# Patient Record
Sex: Male | Born: 1945 | Race: Black or African American | Hispanic: No | Marital: Married | Smoking: Former smoker
Health system: Southern US, Community
[De-identification: ages and names within clinical notes are randomized; demographics above are authoritative.]

## PROBLEM LIST (undated history)

## (undated) DIAGNOSIS — M1990 Unspecified osteoarthritis, unspecified site: Secondary | ICD-10-CM

## (undated) DIAGNOSIS — I639 Cerebral infarction, unspecified: Secondary | ICD-10-CM

## (undated) DIAGNOSIS — E785 Hyperlipidemia, unspecified: Secondary | ICD-10-CM

## (undated) DIAGNOSIS — K219 Gastro-esophageal reflux disease without esophagitis: Secondary | ICD-10-CM

## (undated) DIAGNOSIS — I10 Essential (primary) hypertension: Secondary | ICD-10-CM

## (undated) HISTORY — DX: Hyperlipidemia, unspecified: E78.5

## (undated) HISTORY — PX: JOINT REPLACEMENT: SHX530

## (undated) HISTORY — DX: Cerebral infarction, unspecified: I63.9

## (undated) HISTORY — PX: WRIST SURGERY: SHX841

## (undated) HISTORY — PX: TONSILLECTOMY: SUR1361

## (undated) HISTORY — DX: Unspecified osteoarthritis, unspecified site: M19.90

## (undated) HISTORY — DX: Essential (primary) hypertension: I10

## (undated) HISTORY — PX: TONSILLECTOMY: SHX5217

## (undated) HISTORY — DX: Gastro-esophageal reflux disease without esophagitis: K21.9

---

## 2015-06-30 HISTORY — PX: KNEE SURGERY: SHX244

## 2016-09-17 ENCOUNTER — Encounter: Payer: Self-pay | Admitting: Family Medicine

## 2016-09-17 ENCOUNTER — Ambulatory Visit (INDEPENDENT_AMBULATORY_CARE_PROVIDER_SITE_OTHER): Payer: Medicare PPO | Admitting: Family Medicine

## 2016-09-17 DIAGNOSIS — M179 Osteoarthritis of knee, unspecified: Secondary | ICD-10-CM | POA: Insufficient documentation

## 2016-09-17 DIAGNOSIS — M1990 Unspecified osteoarthritis, unspecified site: Secondary | ICD-10-CM | POA: Insufficient documentation

## 2016-09-17 DIAGNOSIS — Z96651 Presence of right artificial knee joint: Secondary | ICD-10-CM

## 2016-09-17 DIAGNOSIS — K635 Polyp of colon: Secondary | ICD-10-CM | POA: Insufficient documentation

## 2016-09-17 DIAGNOSIS — D126 Benign neoplasm of colon, unspecified: Secondary | ICD-10-CM

## 2016-09-17 DIAGNOSIS — I10 Essential (primary) hypertension: Secondary | ICD-10-CM

## 2016-09-17 DIAGNOSIS — M1612 Unilateral primary osteoarthritis, left hip: Secondary | ICD-10-CM | POA: Diagnosis not present

## 2016-09-17 DIAGNOSIS — M1710 Unilateral primary osteoarthritis, unspecified knee: Secondary | ICD-10-CM | POA: Insufficient documentation

## 2016-09-17 DIAGNOSIS — K219 Gastro-esophageal reflux disease without esophagitis: Secondary | ICD-10-CM | POA: Diagnosis not present

## 2016-09-17 DIAGNOSIS — E785 Hyperlipidemia, unspecified: Secondary | ICD-10-CM

## 2016-09-17 DIAGNOSIS — I639 Cerebral infarction, unspecified: Secondary | ICD-10-CM | POA: Insufficient documentation

## 2016-09-17 NOTE — Progress Notes (Signed)
Chief Complaint  Patient presents with  . Establish Care   Previous PCP in [place] Also goes to the [facility] twice a year No complaints to day Compliant with medicines for BP and Lipids, baby asa to prevent stroke protonix for GERD Ibuprofen for OA in hip Vitamins Up to date with shots Up to date with screening Needs a colo this summer for history polyps Is awaiting a total hip arthroplasty in July   Patient Active Problem List   Diagnosis Date Noted  . HLD (hyperlipidemia) 09/17/2016  . Essential hypertension 09/17/2016  . Stroke ([place]) 09/17/2016  . Chronic GERD 09/17/2016  . Osteoarthritis 09/17/2016  . Knee joint replacement status, right 09/17/2016  . Colon polyp 09/17/2016    Outpatient Encounter Prescriptions as of 09/17/2016  Medication Sig  . amLODipine (NORVASC) 10 MG tablet Take 10 mg by mouth daily.  Marland Kitchen aspirin EC 81 MG tablet Take 81 mg by mouth daily.  . cholecalciferol (VITAMIN D) 1000 units tablet Take 2,000 Units by mouth daily.  . Cyanocobalamin (VITAMIN B 12 PO) Take by mouth.  . hydrochlorothiazide (HYDRODIURIL) 25 MG tablet Take 25 mg by mouth daily.  Marland Kitchen ibuprofen (ADVIL,MOTRIN) 800 MG tablet Take 800 mg by mouth daily.  . pantoprazole (PROTONIX) 40 MG tablet Take 40 mg by mouth daily.  . simvastatin (ZOCOR) 20 MG tablet Take 20 mg by mouth daily.   No facility-administered encounter medications on file as of 09/17/2016.     Past Medical History:  Diagnosis Date  . Arthritis    osteoarthritis knees hips  . GERD (gastroesophageal reflux disease)   . Hyperlipidemia   . Hypertension   . Stroke [facility])     Past Surgical History:  Procedure Laterality Date  . JOINT REPLACEMENT     right knee  . KNEE SURGERY Right 2017  . TONSILLECTOMY     age 64    Social History   Social History  . Marital status: Married    Spouse name: Nevin Bloodgood  . Number of children: 2  . Years of education: 16   Occupational History  . retired     [employer]  .     [employer]   Social History Main Topics  . Smoking status: Current Every Day Smoker    Packs/day: 0.30    Types: Cigarettes    Start date: 06/29/2008  . Smokeless tobacco: Never Used     Comment: 3 cig a day  . Alcohol use Yes     Comment: social drink occas  . Drug use: No  . Sexual activity: Not Currently    Birth control/ protection: Post-menopausal   Other Topics Concern  . Not on file   Social History Narrative   Lives with wife paula   She is in a wheelchair   Both children are adults and live in [place]   Retired in [place] from [place]    Family History  Problem Relation Age of Onset  . Arthritis Mother   . Cancer Mother     breast  . Hypertension Mother   . Alzheimer's disease Mother   . Cirrhosis Maternal Grandmother     Review of Systems  Constitutional: Negative for chills, fever and weight loss.  HENT: Negative.  Negative for congestion and hearing loss.        Regular dental care  Eyes: Negative for blurred vision and pain.       Regular eye care  Respiratory: Negative for cough and shortness of breath.  Cardiovascular: Negative for chest pain and leg swelling.  Gastrointestinal: Negative for abdominal pain, constipation, diarrhea and heartburn.  Genitourinary: Negative for dysuria and frequency.  Musculoskeletal: Positive for joint pain. Negative for falls and myalgias.       Hip  Neurological: Negative for dizziness, seizures and headaches.  Psychiatric/Behavioral: Negative for depression. The patient is not nervous/anxious and does not have insomnia.     BP 104/66 (BP Location: Right Arm, Patient Position: Sitting, Cuff Size: Normal)   Pulse 84   Temp 97.3 F (36.3 C) (Temporal)   Resp 18   Ht 5' 8.5" (1.74 m)   Wt 230 lb (104.3 kg)   SpO2 98%   BMI 34.46 kg/m   Physical Exam  Constitutional: He is oriented to person, place, and time. He appears well-developed and well-nourished.  HENT:  Head: Normocephalic and atraumatic.  Right Ear: External ear  normal.  Left Ear: External ear normal.  Mouth/Throat: Oropharynx is clear and moist.  Eyes: Conjunctivae are normal. Pupils are equal, round, and reactive to light.  Neck: Normal range of motion. Neck supple. No thyromegaly present.  Cardiovascular: Normal rate, regular rhythm and normal heart sounds.   Pulmonary/Chest: Effort normal and breath sounds normal. No respiratory distress.  Abdominal: Soft. Bowel sounds are normal.  Musculoskeletal: Normal range of motion. He exhibits no edema.  Lymphadenopathy:    He has no cervical adenopathy.  Neurological: He is alert and oriented to person, place, and time.  Gait normal  Skin: Skin is warm and dry.  Psychiatric: He has a normal mood and affect. His behavior is normal. Thought content normal.  Nursing note and vitals reviewed.   ASSESSMENT/PLAN:  1. Hyperlipidemia, unspecified hyperlipidemia type  - CBC - Comprehensive metabolic panel - Lipid panel - VITAMIN D 25 Hydroxy (Vit-D Deficiency, Fractures) - Urinalysis, Routine w reflex microscopic  2. Essential hypertension   3. Cerebrovascular accident (CVA), unspecified mechanism ([place])   4. Chronic GERD   5. Primary osteoarthritis of left hip   6. Knee joint replacement status, right   7. Adenomatous polyp of colon, unspecified part of colon    Patient Instructions  Need old records from PCP Need records from [place] if available Stay as active as you can manage See me in June for a PE and EKG pre op   Raylene Everts, MD

## 2016-09-17 NOTE — Patient Instructions (Signed)
Need old records from PCP Need records from [place] if available Stay as active as you can manage See me in June for a PE and EKG pre op

## 2016-09-18 ENCOUNTER — Telehealth: Payer: Self-pay | Admitting: Family Medicine

## 2016-09-18 NOTE — Telephone Encounter (Signed)
Patient's  calling doctor know that pt uses [facility]   1800 [phone]  Fax [phone]

## 2016-09-30 NOTE — Telephone Encounter (Signed)
Patient's calling to let doctor know that they use  [facility]   1800 [phone]  Fax [phone]

## 2016-09-30 NOTE — Telephone Encounter (Signed)
Changed in the computer

## 2016-11-17 ENCOUNTER — Ambulatory Visit: Payer: Self-pay | Admitting: Orthopedic Surgery

## 2016-11-27 ENCOUNTER — Encounter: Payer: Self-pay | Admitting: Family Medicine

## 2016-11-27 ENCOUNTER — Other Ambulatory Visit: Payer: Self-pay

## 2016-11-27 ENCOUNTER — Ambulatory Visit (INDEPENDENT_AMBULATORY_CARE_PROVIDER_SITE_OTHER): Payer: Medicare PPO | Admitting: Family Medicine

## 2016-11-27 VITALS — BP 112/68 | HR 68 | Temp 97.6°F | Resp 16 | Ht 69.0 in | Wt 224.1 lb

## 2016-11-27 DIAGNOSIS — Z01818 Encounter for other preprocedural examination: Secondary | ICD-10-CM

## 2016-11-27 DIAGNOSIS — M1612 Unilateral primary osteoarthritis, left hip: Secondary | ICD-10-CM | POA: Diagnosis not present

## 2016-11-27 NOTE — Patient Instructions (Signed)
I will report your pre operative results to Dr Les Pou

## 2016-11-27 NOTE — Progress Notes (Addendum)
Chief Complaint  Patient presents with  . Advice Only    pre surgical. left hip   L hip replacement anterior approach scheduled for June 13. Has had R knee replaced within the last 2 years with no complications and good success Post op pain management discussed His son will come help care for him No history anesthesia complications No cardiac diagnosis.  Can exercise without dyspnea or chest discomfort.  Has had cardiac testing X 2 in the past without CAD. 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 EKG done Labs ordered BP well controlled   Patient Active Problem List   Diagnosis Date Noted  . HLD (hyperlipidemia) 09/17/2016  . Essential hypertension 09/17/2016  . Stroke ([place]) 09/17/2016  . Chronic GERD 09/17/2016  . Osteoarthritis 09/17/2016  . Knee joint replacement status, right 09/17/2016  . Colon polyp 09/17/2016    Outpatient Encounter Prescriptions as of 11/27/2016  Medication Sig  . amLODipine (NORVASC) 10 MG tablet Take 10 mg by mouth daily.  Marland Kitchen aspirin EC 81 MG tablet Take 81 mg by mouth daily.  . cholecalciferol (VITAMIN D) 1000 units tablet Take 2,000 Units by mouth daily.  . Cyanocobalamin (VITAMIN B 12 PO) Take by mouth.  . hydrochlorothiazide (HYDRODIURIL) 25 MG tablet Take 25 mg by mouth daily.  Marland Kitchen ibuprofen (ADVIL,MOTRIN) 800 MG tablet Take 800 mg by mouth daily.  . pantoprazole (PROTONIX) 40 MG tablet Take 40 mg by mouth daily.  . simvastatin (ZOCOR) 20 MG tablet Take 20 mg by mouth daily.   No facility-administered encounter medications on file as of 11/27/2016.     Past Medical History:  Diagnosis Date  . Arthritis    osteoarthritis knees hips  . GERD (gastroesophageal reflux disease)   . Hyperlipidemia   . Hypertension   . Stroke [facility])     Past Surgical History:  Procedure Laterality Date  . JOINT REPLACEMENT     right knee  . KNEE SURGERY Right 2017  . TONSILLECTOMY     age 83    Social History   Social History  . Marital status: Married    Spouse  name: Nevin Bloodgood  . Number of children: 2  . Years of education: 16   Occupational History  . retired     [employer]  .      [employer]   Social History Main Topics  . Smoking status: Current Every Day Smoker    Packs/day: 0.30    Types: Cigarettes    Start date: 06/29/2008  . Smokeless tobacco: Never Used     Comment: 3 cig a day  . Alcohol use Yes     Comment: social drink occas  . Drug use: No  . Sexual activity: Not Currently    Birth control/ protection: Post-menopausal   Other Topics Concern  . Not on file   Social History Narrative   Lives with wife paula   She is in a wheelchair   Both children are adults and live in [place]   Retired in [place] from [place]    Family History  Problem Relation Age of Onset  . Arthritis Mother   . Cancer Mother        breast  . Hypertension Mother   . Alzheimer's disease Mother   . Cirrhosis Maternal Grandmother     Review of Systems  Constitutional: Negative for chills, fever and weight loss.  HENT: Negative for congestion and hearing loss.   Eyes: Negative for blurred vision and pain.  Respiratory: Negative  for cough and shortness of breath.   Cardiovascular: Negative for chest pain and leg swelling.  Gastrointestinal: Negative for abdominal pain, constipation, diarrhea and heartburn.  Genitourinary: Negative for dysuria and frequency.  Musculoskeletal: Positive for joint pain. Negative for falls and myalgias.       L hip only  Neurological: Negative for dizziness, seizures and headaches.  Psychiatric/Behavioral: Negative for depression. The patient is not nervous/anxious and does not have insomnia.     BP 112/68 (BP Location: Right Arm, Patient Position: Sitting, Cuff Size: Large)   Pulse 68   Temp 97.6 F (36.4 C) (Temporal)   Resp 16   Ht 5\' 9"  (1.753 m)   Wt 224 lb 1.9 oz (101.7 kg)   SpO2 99%   BMI 33.10 kg/m   Physical Exam   BP 112/68 (BP Location: Right Arm, Patient Position: Sitting, Cuff Size: Large)   Pulse 68    Temp 97.6 F (36.4 C) (Temporal)   Resp 16   Ht 5\' 9"  (1.753 m)   Wt 224 lb 1.9 oz (101.7 kg)   SpO2 99%   BMI 33.10 kg/m   General Appearance:    Alert, cooperative, no distress, appears stated age  Head:    Normocephalic, without obvious abnormality, atraumatic  Eyes:    PERRL, conjunctiva/corneas clear, EOM's intact, fundi    benign, both eyes       Ears:    Normal TM's and external ear canals, both ears  Nose:   Nares normal, septum midline, mucosa normal, no drainage   or sinus tenderness  Throat:   Lips, mucosa, and tongue normal; Upper denture, lower teeth , those remaining, intact and gums normal  Neck:   Supple, symmetrical, trachea midline, no adenopathy;       thyroid:  No enlargement/tenderness/nodules; no carotid   bruit   Back:     Symmetric, no curvature, ROM normal, no CVA tenderness  Lungs:     Clear to auscultation bilaterally, respirations unlabored  Chest wall:    No tenderness or deformity  Heart:    Regular rate and rhythm, S1 and S2 normal, no murmur, rub   or gallop, rare ectopy  Abdomen:     Soft, non-tender, bowel sounds active all four quadrants,    no masses, no organomegaly  Genitalia:    Normal male without lesion, discharge or tenderness  Extremities:   Extremities normal, atraumatic, no cyanosis. Well healed arthroplasty scar  R knee, mild edema R ankle  Pulses:   2+ and symmetric all extremities  Skin:   Skin color, texture, turgor normal, no rashes or lesions  Lymph nodes:   Cervical, supraclavicular, and axillary nodes normal  Neurologic:   Normal strength, sensation and reflexes      throughout    1. Primary osteoarthritis of left hip Limited ROM.  Pain.  End stage OA  2. Pre-op evaluation  - BASIC METABOLIC PANEL WITH GFR - CBC - Urinalysis, Routine w reflex microscopic   Patient Instructions  I will report your pre operative results to Dr Carmelia Bake, LABS: Results for [name], [name] (MRN [id number]) as of 11/30/2016 08:03   Ref. Range 11/27/2016 15:26  Sodium Latest Ref Range: 135 - 146 mmol/L 144  Potassium Latest Ref Range: 3.5 - 5.3 mmol/L 3.6  Chloride Latest Ref Range: 98 - 110 mmol/L 104  CO2 Latest Ref Range: 20 - 31 mmol/L 30  Glucose Latest Ref Range: 65 - 99 mg/dL 87  BUN Latest Ref Range: 7 -  25 mg/dL 11  Creatinine Latest Ref Range: 0.70 - 1.18 mg/dL 1.05  Calcium Latest Ref Range: 8.6 - 10.3 mg/dL 9.7  GFR, Est African American Latest Ref Range: >=60 mL/min 83  GFR, Est Non African American Latest Ref Range: >=60 mL/min 72  WBC Latest Ref Range: 3.8 - 10.8 K/uL 6.5  RBC Latest Ref Range: 4.20 - 5.80 MIL/uL 4.89  Hemoglobin Latest Ref Range: 13.2 - 17.1 g/dL 15.5  HCT Latest Ref Range: 38.5 - 50.0 % 44.5  MCV Latest Ref Range: 80.0 - 100.0 fL 91.0  MCH Latest Ref Range: 27.0 - 33.0 pg 31.7  MCHC Latest Ref Range: 32.0 - 36.0 g/dL 34.8  RDW Latest Ref Range: 11.0 - 15.0 % 14.5  Platelets Latest Ref Range: 140 - 400 K/uL 190  MPV Latest Ref Range: 7.5 - 12.5 fL 11.1  URINALYSIS, ROUTINE W REFLEX MICROSCOPIC Unknown Rpt  Appearance Latest Ref Range: CLEAR  CLEAR  Bilirubin Urine Latest Ref Range: NEGATIVE  NEGATIVE  Color, Urine Latest Ref Range: YELLOW  DARK YELLOW  Glucose Latest Ref Range: NEGATIVE  NEGATIVE  Hgb urine dipstick Latest Ref Range: NEGATIVE  NEGATIVE  Ketones, ur Latest Ref Range: NEGATIVE  NEGATIVE  Leukocytes, UA Latest Ref Range: NEGATIVE  NEGATIVE  Nitrite Latest Ref Range: NEGATIVE  NEGATIVE  pH Latest Ref Range: 5.0 - 8.0  6.0  Protein Latest Ref Range: NEGATIVE  NEGATIVE  Specific Gravity, Urine Latest Ref Range: 1.001 - 1.035  1.027  No medical problems identified that would place patient at increased risk from proposed surgery. Raylene Everts, MD

## 2016-11-28 LAB — CBC
HEMATOCRIT: 44.5 % (ref 38.5–50.0)
Hemoglobin: 15.5 g/dL (ref 13.2–17.1)
MCH: 31.7 pg (ref 27.0–33.0)
MCHC: 34.8 g/dL (ref 32.0–36.0)
MCV: 91 fL (ref 80.0–100.0)
MPV: 11.1 fL (ref 7.5–12.5)
PLATELETS: 190 10*3/uL (ref 140–400)
RBC: 4.89 MIL/uL (ref 4.20–5.80)
RDW: 14.5 % (ref 11.0–15.0)
WBC: 6.5 10*3/uL (ref 3.8–10.8)

## 2016-11-28 LAB — BASIC METABOLIC PANEL WITH GFR
BUN: 11 mg/dL (ref 7–25)
CALCIUM: 9.7 mg/dL (ref 8.6–10.3)
CO2: 30 mmol/L (ref 20–31)
CREATININE: 1.05 mg/dL (ref 0.70–1.18)
Chloride: 104 mmol/L (ref 98–110)
GFR, EST NON AFRICAN AMERICAN: 72 mL/min (ref >=60)
GFR, Est African American: 83 mL/min (ref >=60)
Glucose, Bld: 87 mg/dL (ref 65–99)
Potassium: 3.6 mmol/L (ref 3.5–5.3)
SODIUM: 144 mmol/L (ref 135–146)

## 2016-11-28 LAB — URINALYSIS, ROUTINE W REFLEX MICROSCOPIC
BILIRUBIN URINE: NEGATIVE
Glucose, UA: NEGATIVE
Hgb urine dipstick: NEGATIVE
Ketones, ur: NEGATIVE
Leukocytes, UA: NEGATIVE
Nitrite: NEGATIVE
PH: 6 (ref 5.0–8.0)
PROTEIN: NEGATIVE
SPECIFIC GRAVITY, URINE: 1.027 (ref 1.001–1.035)

## 2016-11-30 ENCOUNTER — Encounter: Payer: Self-pay | Admitting: Family Medicine

## 2016-12-02 NOTE — Progress Notes (Signed)
11-27-16 (EPIC) Surgical clearance from Dr. Meda Coffee per office note 11-27-16 (EPIC) EKG, BMP, CBC, UA

## 2016-12-02 NOTE — Patient Instructions (Addendum)
Melbert Botelho  12/02/2016   Your procedure is scheduled on: 12-09-16  Report to [facility] Main Entrance Report to Admitting at 9:30 AM   Call this number if you have problems the morning of surgery [phone]   Remember: ONLY 1 PERSON MAY GO WITH YOU TO SHORT STAY TO GET  READY MORNING OF [place].  Do not eat food or drink liquids :After Midnight.     Take these medicines the morning of surgery with A SIP OF WATER: Amlodipine (Norvasc), and Pantoprazole (Protonix)                                You may not have any metal on your body including hair pins and              piercings  Do not wear jewelry, make-up, lotions, powders or perfumes, deodorant             Men may shave face and neck.   Do not bring valuables to the hospital. [place].  Contacts, dentures or bridgework may not be worn into surgery.  Leave suitcase in the car. After surgery it may be brought to your room.                 Please read over the following fact sheets you were given: _____________________________________________________________________             [facility] - Preparing for Surgery Before surgery, you can play an important role.  Because skin is not sterile, your skin needs to be as free of germs as possible.  You can reduce the number of germs on your skin by washing with CHG (chlorahexidine gluconate) soap before surgery.  CHG is an antiseptic cleaner which kills germs and bonds with the skin to continue killing germs even after washing. Please DO NOT use if you have an allergy to CHG or antibacterial soaps.  If your skin becomes reddened/irritated stop using the CHG and inform your nurse when you arrive at Short Stay. Do not shave (including legs and underarms) for at least 48 hours prior to the first CHG shower.  You may shave your face/neck. Please follow these instructions carefully:  1.  Shower with CHG Soap  the night before surgery and the  morning of Surgery.  2.  If you choose to wash your hair, wash your hair first as usual with your  normal  shampoo.  3.  After you shampoo, rinse your hair and body thoroughly to remove the  shampoo.                           4.  Use CHG as you would any other liquid soap.  You can apply chg directly  to the skin and wash                       Gently with a scrungie or clean washcloth.  5.  Apply the CHG Soap to your body ONLY FROM THE NECK DOWN.   Do not use on face/ open  Wound or open sores. Avoid contact with eyes, ears mouth and genitals (private parts).                       Wash face,  Genitals (private parts) with your normal soap.             6.  Wash thoroughly, paying special attention to the area where your surgery  will be performed.  7.  Thoroughly rinse your body with warm water from the neck down.  8.  DO NOT shower/wash with your normal soap after using and rinsing off  the CHG Soap.                9.  Pat yourself dry with a clean towel.            10.  Wear clean pajamas.            11.  Place clean sheets on your bed the night of your first shower and do not  sleep with pets. Day of Surgery : Do not apply any lotions/deodorants the morning of surgery.  Please wear clean clothes to the hospital/surgery center.  FAILURE TO FOLLOW THESE INSTRUCTIONS MAY RESULT IN THE CANCELLATION OF YOUR SURGERY PATIENT SIGNATURE_________________________________  NURSE SIGNATURE__________________________________  ________________________________________________________________________   Adam Phenix  An incentive spirometer is a tool that can help keep your lungs clear and active. This tool measures how well you are filling your lungs with each breath. Taking long deep breaths may help reverse or decrease the chance of developing breathing (pulmonary) problems (especially infection) following:  A long period of time when  you are unable to move or be active. BEFORE THE PROCEDURE   If the spirometer includes an indicator to show your best effort, your nurse or respiratory therapist will set it to a desired goal.  If possible, sit up straight or lean slightly forward. Try not to slouch.  Hold the incentive spirometer in an upright position. INSTRUCTIONS FOR USE  1. Sit on the edge of your bed if possible, or sit up as far as you can in bed or on a chair. 2. Hold the incentive spirometer in an upright position. 3. Breathe out normally. 4. Place the mouthpiece in your mouth and seal your lips tightly around it. 5. Breathe in slowly and as deeply as possible, raising the piston or the ball toward the top of the column. 6. Hold your breath for 3-5 seconds or for as long as possible. Allow the piston or ball to fall to the bottom of the column. 7. Remove the mouthpiece from your mouth and breathe out normally. 8. Rest for a few seconds and repeat Steps 1 through 7 at least 10 times every 1-2 hours when you are awake. Take your time and take a few normal breaths between deep breaths. 9. The spirometer may include an indicator to show your best effort. Use the indicator as a goal to work toward during each repetition. 10. After each set of 10 deep breaths, practice coughing to be sure your lungs are clear. If you have an incision (the cut made at the time of surgery), support your incision when coughing by placing a pillow or rolled up towels firmly against it. Once you are able to get out of bed, walk around indoors and cough well. You may stop using the incentive spirometer when instructed by your caregiver.  RISKS AND COMPLICATIONS  Take your time so you do not get  dizzy or light-headed.  If you are in pain, you may need to take or ask for pain medication before doing incentive spirometry. It is harder to take a deep breath if you are having pain. AFTER USE  Rest and breathe slowly and easily.  It can be  helpful to keep track of a log of your progress. Your caregiver can provide you with a simple table to help with this. If you are using the spirometer at home, follow these instructions: [place] IF:   You are having difficultly using the spirometer.  You have trouble using the spirometer as often as instructed.  Your pain medication is not giving enough relief while using the spirometer.  You develop fever of 100.5 F (38.1 C) or higher. SEEK IMMEDIATE MEDICAL CARE IF:   You cough up bloody sputum that had not been present before.  You develop fever of 102 F (38.9 C) or greater.  You develop worsening pain at or near the incision site. MAKE SURE YOU:   Understand these instructions.  Will watch your condition.  Will get help right away if you are not doing well or get worse. Document Released: 10/26/2006 Document Revised: 09/07/2011 Document Reviewed: 12/27/2006 ExitCare Patient Information 2014 ExitCare, [place].   ________________________________________________________________________  WHAT IS A BLOOD TRANSFUSION? Blood Transfusion Information  A transfusion is the replacement of blood or some of its parts. Blood is made up of multiple cells which provide different functions.  Red blood cells carry oxygen and are used for blood loss replacement.  White blood cells fight against infection.  Platelets control bleeding.  Plasma helps clot blood.  Other blood products are available for specialized needs, such as hemophilia or other clotting disorders. BEFORE THE TRANSFUSION  Who gives blood for transfusions?   Healthy volunteers who are fully evaluated to make sure their blood is safe. This is blood bank blood. Transfusion therapy is the safest it has ever been in the practice of medicine. Before blood is taken from a donor, a complete history is taken to make sure that person has no history of diseases nor engages in risky social behavior (examples are  intravenous drug use or sexual activity with multiple partners). The donor's travel history is screened to minimize risk of transmitting infections, such as malaria. The donated blood is tested for signs of infectious diseases, such as HIV and hepatitis. The blood is then tested to be sure it is compatible with you in order to minimize the chance of a transfusion reaction. If you or a relative donates blood, this is often done in anticipation of surgery and is not appropriate for emergency situations. It takes many days to process the donated blood. RISKS AND COMPLICATIONS Although transfusion therapy is very safe and saves many lives, the main dangers of transfusion include:   Getting an infectious disease.  Developing a transfusion reaction. This is an allergic reaction to something in the blood you were given. Every precaution is taken to prevent this. The decision to have a blood transfusion has been considered carefully by your caregiver before blood is given. Blood is not given unless the benefits outweigh the risks. AFTER THE TRANSFUSION  Right after receiving a blood transfusion, you will usually feel much better and more energetic. This is especially true if your red blood cells have gotten low (anemic). The transfusion raises the level of the red blood cells which carry oxygen, and this usually causes an energy increase.  The nurse administering the transfusion will  monitor you carefully for complications. HOME CARE INSTRUCTIONS  No special instructions are needed after a transfusion. You may find your energy is better. Speak with your caregiver about any limitations on activity for underlying diseases you may have. SEEK MEDICAL CARE IF:   Your condition is not improving after your transfusion.  You develop redness or irritation at the intravenous (IV) site. SEEK IMMEDIATE MEDICAL CARE IF:  Any of the following symptoms occur over the next 12 hours:  Shaking chills.  You have a  temperature by mouth above 102 F (38.9 C), not controlled by medicine.  Chest, back, or muscle pain.  People around you feel you are not acting correctly or are confused.  Shortness of breath or difficulty breathing.  Dizziness and fainting.  You get a rash or develop hives.  You have a decrease in urine output.  Your urine turns a dark color or changes to pink, red, or brown. Any of the following symptoms occur over the next 10 days:  You have a temperature by mouth above 102 F (38.9 C), not controlled by medicine.  Shortness of breath.  Weakness after normal activity.  The white part of the eye turns yellow (jaundice).  You have a decrease in the amount of urine or are urinating less often.  Your urine turns a dark color or changes to pink, red, or brown. Document Released: 06/12/2000 Document Revised: 09/07/2011 Document Reviewed: 01/30/2008 [facility] Patient Information 2014 [place], [place].  _______________________________________________________________________

## 2016-12-04 ENCOUNTER — Encounter (HOSPITAL_COMMUNITY): Payer: Self-pay

## 2016-12-04 ENCOUNTER — Encounter (HOSPITAL_COMMUNITY)
Admission: RE | Admit: 2016-12-04 | Discharge: 2016-12-04 | Disposition: A | Discharge status: Home / Self Care | Payer: Medicare PPO | Source: Ambulatory Visit | Attending: Orthopedic Surgery | Admitting: Orthopedic Surgery

## 2016-12-04 DIAGNOSIS — Z01812 Encounter for preprocedural laboratory examination: Secondary | ICD-10-CM | POA: Diagnosis not present

## 2016-12-04 DIAGNOSIS — M1612 Unilateral primary osteoarthritis, left hip: Secondary | ICD-10-CM | POA: Insufficient documentation

## 2016-12-04 LAB — SURGICAL PCR SCREEN
MRSA, PCR: NEGATIVE
STAPHYLOCOCCUS AUREUS: NEGATIVE

## 2016-12-04 LAB — COMPREHENSIVE METABOLIC PANEL
ALT: 29 U/L (ref 17–63)
ANION GAP: 8 (ref 5–15)
AST: 28 U/L (ref 15–41)
Albumin: 4.4 g/dL (ref 3.5–5.0)
Alkaline Phosphatase: 86 U/L (ref 38–126)
BUN: 11 mg/dL (ref 6–20)
CHLORIDE: 101 mmol/L (ref 101–111)
CO2: 32 mmol/L (ref 22–32)
Calcium: 9.3 mg/dL (ref 8.9–10.3)
Creatinine, Ser: 0.97 mg/dL (ref 0.61–1.24)
GFR calc Af Amer: >60 mL/min (ref >60)
GFR calc non Af Amer: >60 mL/min (ref >60)
Glucose, Bld: 102 mg/dL — ABNORMAL HIGH (ref 65–99)
POTASSIUM: 3.4 mmol/L — AB (ref 3.5–5.1)
SODIUM: 141 mmol/L (ref 135–145)
Total Bilirubin: 0.9 mg/dL (ref 0.3–1.2)
Total Protein: 7.4 g/dL (ref 6.5–8.1)

## 2016-12-04 LAB — APTT: APTT: 32 s (ref 24–36)

## 2016-12-04 LAB — PROTIME-INR
INR: 0.96
Prothrombin Time: 12.8 seconds (ref 11.4–15.2)

## 2016-12-05 LAB — ABO/RH: ABO/RH(D): O POS

## 2016-12-08 ENCOUNTER — Ambulatory Visit: Payer: Self-pay | Admitting: Orthopedic Surgery

## 2016-12-08 NOTE — Progress Notes (Signed)
Called and spoke to patient of time change for him to arrive at admitting on Wednesday 12/09/2016 at 0900  Am to check in. Patient verbalized understanding!

## 2016-12-08 NOTE — H&P (Signed)
Jeremiah Kelley DOB: Jun 24, 1946 Married / Language: English / Race: Black or African American Male Date of Admission:  12/09/2016 CC:  Left hip pain History of Present Illness The patient is a 71 year old male who comes in for a preoperative History and Physical. The patient is scheduled for a left total hip arthroplasty (anterior) to be performed by Dr. [name]. [name], MD at [facility] on 12-09-2016. The patient is a 71 year old male who presented with a hip problem. The patient was seen for a second opinion.The patient reports left hip problems including pain symptoms that have been present for 2 year(s). The symptoms began without any known injury. Symptoms reported include hip pain, night pain, stiffness, numbness (in the toes), difficulty flexing hip, difficulty bearing weight and difficulty ambulating The patient reports symptoms radiating to the: left groin, left thigh posteriorly and lower back and left flank area. The patient describes the hip problem as aching.The patient feels as if their symptoms are does feel they are worsening. Symptoms are exacerbated by flexing hip and lying on the affected side. Symptoms are relieved by ice and heat. Current treatment includes nonsteroidal anti-inflammatory drugs (ibuprofen 800mg ). Pertinent medical history includes osteoarthritis (hx right TKA Jan 2017 by Dr. [name]. He states the right knee is doing well). Prior to being seen, the patient was previously evaluated by a colleague. Previous workup for this problem has included hip x-rays. Previous treatment for this problem has included corticosteroid injection (done at the [place]; helped for about two months). He is the caregiver for his wife, who has MS, and does a lot of lifting with her. His left hip is getting progressively worse. He is at a stage now where he barely can do anything because of the hip. He is primary caregiver for his wife, but he is having a more difficult time of doing that also.  The pain is in his groin, radiating down his anterior thigh to his knee. He does not have any instability symptoms. The hip has lost a lot of movement also. He is ready to proceed with surgery. They have been treated conservatively in the past for the above stated problem and despite conservative measures, they continue to have progressive pain and severe functional limitations and dysfunction. They have failed non-operative management including home exercise, medications. It is felt that they would benefit from undergoing total joint replacement. Risks and benefits of the procedure have been discussed with the patient and they elect to proceed with surgery. There are no active contraindications to surgery such as ongoing infection or rapidly progressive neurological disease.  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 "Bad experience"  Family History Cancer  Mother. Liver Disease, Chronic  Maternal Grandmother. Osteoarthritis  Mother. Osteoporosis  Mother.  Social History Children  4 Current drinker  08/27/2016: Currently drinks hard liquor only occasionally per week Current work status  retired Furniture conservator/restorer daily; does other Living situation  live with spouse Marital status  married No history of drug/alcohol rehab  Not under pain contract  Number of flights of stairs before winded  2-3 Tobacco / smoke exposure  08/27/2016: yes Tobacco use  Current every day smoker. 08/27/2016: smoke(d) 1/2 pack(s) per  day uses less than 1/2 can(s) smokeless per week Advance Directives  Living Will  Medication History  Ibuprofen (800MG   Tablet, Oral) Active. AmLODIPine Besylate (10MG  Tablet, Oral) Active. Pantoprazole Sodium (20MG  Tablet DR, Oral) Active. HydroCHLOROthiazide (25MG  Tablet, Oral) Active. Simvastatin (20MG  Tablet, Oral) Active. Aspirin (81MG  Tablet, Oral) Active. Vitamin B12 (Oral) Specific strength unknown - Active. Vitamin D3 (Oral) Specific strength unknown - Active. Centrum Silver Multivitamin Active.  Past Surgical History Arthroscopy of Knee  bilateral Tonsillectomy  Total Knee Replacement  right Left Wrist Surgery   Review of Systems General Not Present- Chills, Fatigue, Fever, Memory Loss, Night Sweats, Weight Gain and Weight Loss. Skin Not Present- Eczema, Hives, Itching, Lesions and Rash. HEENT Not Present- Dentures, Double Vision, Headache, Hearing Loss, Tinnitus and Visual Loss. Respiratory Not Present- Allergies, Chronic Cough, Coughing up blood, Shortness of breath at rest and Shortness of breath with exertion. Cardiovascular Not Present- Chest Pain, Difficulty Breathing Lying Down, Murmur, Palpitations, Racing/skipping heartbeats and Swelling. Gastrointestinal Not Present- Abdominal Pain, Bloody Stool, Constipation, Diarrhea, Difficulty Swallowing, Heartburn, Jaundice, Loss of appetitie, Nausea and Vomiting. Male Genitourinary Not Present- Blood in Urine, Discharge, Flank Pain, Incontinence, Painful Urination, Urgency, Urinary frequency, Urinary Retention, Urinating at Night and Weak urinary stream. Musculoskeletal Present- Joint Pain. Not Present- Back Pain, Joint Swelling, Morning Stiffness, Muscle Pain, Muscle Weakness and Spasms. Neurological Not Present- Blackout spells, Difficulty with balance, Dizziness, Paralysis, Tremor and Weakness. Psychiatric Not Present- Insomnia.  Vitals  Weight: 222 lb Height: 68.5in Body Surface Area: 2.15 m Body Mass Index: 33.26 kg/m  Pulse: 56 (Regular)  Resp.: 14 (Unlabored)  BP: 118/72 (Sitting, Left Arm,  Standard)   Physical Exam General Mental Status -Alert, cooperative and good historian. General Appearance-pleasant, Not in acute distress. Orientation-Oriented X3. Build & Nutrition-Well nourished and Well developed.  Head and Neck Head-normocephalic, atraumatic . Neck Global Assessment - supple, no bruit auscultated on the right, no bruit auscultated on the left.  Eye Vision-Wears corrective lenses. Pupil - Bilateral-Regular and Round. Motion - Bilateral-EOMI.  ENMT Note: upper denture   Chest and Lung Exam Auscultation Breath sounds - clear at anterior chest wall and clear at posterior chest wall. Adventitious sounds - No Adventitious sounds.  Cardiovascular Auscultation Rhythm - Regular rate and rhythm. Heart Sounds - S1 WNL and S2 WNL. Murmurs & Other Heart Sounds - Auscultation of the heart reveals - No Murmurs.  Abdomen Palpation/Percussion Tenderness - Abdomen is non-tender to palpation. Rigidity (guarding) - Abdomen is soft. Auscultation Auscultation of the abdomen reveals - Bowel sounds normal.  Male Genitourinary Note: Not done, not pertinent to present illness   Musculoskeletal Note: On exam, he is in no distress. His right hip has normal range of motion with no tenderness. Left hip can be flexed to about 90, no internal rotation, about 10 of external rotation, 10 to 20 of abduction. He is about 3-7/8 inch short on the left compared to the right. Pulses and sensation are intact. Strength is intact.  RADIOGRAPHS AP pelvis and lateral of the left hip shows severe bone on bone arthritis with large osteophyte formation and subchondral cystic formation. He also had an x-ray taken AP and lateral of lumbar. He does have some degenerative disc disease, but not as bad as what we are seeing with his hip.   Assessment & Plan Primary osteoarthritis of left hip (M16.12)  Note:Surgical Plans: Left Total Hip Replacement - Anterior  Approach  Disposition: Home with help from children  PCP: Dr. Meda Coffee - Patient has been  seen preoperatively and felt to be stable for surgery.  Topical TXA - History of Stroke  Anesthesia Issues: None  Patient was instructed on what medications to stop prior to surgery.  Signed electronically by Joelene Millin, III PA-C

## 2016-12-09 ENCOUNTER — Inpatient Hospital Stay (HOSPITAL_COMMUNITY)
Admission: RE | Admit: 2016-12-09 | Discharge: 2016-12-10 | DRG: 470 | Disposition: A | Discharge status: Home Health | Payer: Medicare PPO | Source: Ambulatory Visit | Attending: Orthopedic Surgery | Admitting: Orthopedic Surgery

## 2016-12-09 ENCOUNTER — Encounter (HOSPITAL_COMMUNITY): Payer: Self-pay | Admitting: *Deleted

## 2016-12-09 ENCOUNTER — Inpatient Hospital Stay (HOSPITAL_COMMUNITY): Payer: Medicare PPO | Admitting: Certified Registered Nurse Anesthetist

## 2016-12-09 ENCOUNTER — Inpatient Hospital Stay (HOSPITAL_COMMUNITY): Payer: Medicare PPO

## 2016-12-09 ENCOUNTER — Encounter (HOSPITAL_COMMUNITY)
Admission: RE | Disposition: A | Discharge status: Home Health | Payer: Self-pay | Source: Ambulatory Visit | Attending: Orthopedic Surgery

## 2016-12-09 DIAGNOSIS — M170 Bilateral primary osteoarthritis of knee: Secondary | ICD-10-CM | POA: Diagnosis present

## 2016-12-09 DIAGNOSIS — F17200 Nicotine dependence, unspecified, uncomplicated: Secondary | ICD-10-CM | POA: Diagnosis present

## 2016-12-09 DIAGNOSIS — K219 Gastro-esophageal reflux disease without esophagitis: Secondary | ICD-10-CM | POA: Diagnosis present

## 2016-12-09 DIAGNOSIS — M169 Osteoarthritis of hip, unspecified: Secondary | ICD-10-CM | POA: Diagnosis present

## 2016-12-09 DIAGNOSIS — Z8262 Family history of osteoporosis: Secondary | ICD-10-CM | POA: Diagnosis not present

## 2016-12-09 DIAGNOSIS — E785 Hyperlipidemia, unspecified: Secondary | ICD-10-CM | POA: Diagnosis present

## 2016-12-09 DIAGNOSIS — M25752 Osteophyte, left hip: Secondary | ICD-10-CM | POA: Diagnosis present

## 2016-12-09 DIAGNOSIS — Z8619 Personal history of other infectious and parasitic diseases: Secondary | ICD-10-CM

## 2016-12-09 DIAGNOSIS — Z79899 Other long term (current) drug therapy: Secondary | ICD-10-CM | POA: Diagnosis not present

## 2016-12-09 DIAGNOSIS — M25552 Pain in left hip: Secondary | ICD-10-CM | POA: Diagnosis present

## 2016-12-09 DIAGNOSIS — M5136 Other intervertebral disc degeneration, lumbar region: Secondary | ICD-10-CM | POA: Diagnosis present

## 2016-12-09 DIAGNOSIS — E7800 Pure hypercholesterolemia, unspecified: Secondary | ICD-10-CM | POA: Diagnosis present

## 2016-12-09 DIAGNOSIS — Z885 Allergy status to narcotic agent status: Secondary | ICD-10-CM | POA: Diagnosis not present

## 2016-12-09 DIAGNOSIS — M1612 Unilateral primary osteoarthritis, left hip: Principal | ICD-10-CM | POA: Diagnosis present

## 2016-12-09 DIAGNOSIS — Z8701 Personal history of pneumonia (recurrent): Secondary | ICD-10-CM | POA: Diagnosis not present

## 2016-12-09 DIAGNOSIS — M069 Rheumatoid arthritis, unspecified: Secondary | ICD-10-CM | POA: Diagnosis present

## 2016-12-09 DIAGNOSIS — Z7982 Long term (current) use of aspirin: Secondary | ICD-10-CM

## 2016-12-09 DIAGNOSIS — Z8673 Personal history of transient ischemic attack (TIA), and cerebral infarction without residual deficits: Secondary | ICD-10-CM

## 2016-12-09 DIAGNOSIS — I10 Essential (primary) hypertension: Secondary | ICD-10-CM | POA: Diagnosis present

## 2016-12-09 DIAGNOSIS — G8929 Other chronic pain: Secondary | ICD-10-CM | POA: Diagnosis present

## 2016-12-09 DIAGNOSIS — Z96651 Presence of right artificial knee joint: Secondary | ICD-10-CM | POA: Diagnosis present

## 2016-12-09 DIAGNOSIS — Z96649 Presence of unspecified artificial hip joint: Secondary | ICD-10-CM

## 2016-12-09 HISTORY — PX: TOTAL HIP ARTHROPLASTY: SHX124

## 2016-12-09 LAB — TYPE AND SCREEN
ABO/RH(D): O POS
Antibody Screen: NEGATIVE

## 2016-12-09 LAB — CBC
HEMATOCRIT: 36.8 % — AB (ref 39.0–52.0)
HEMOGLOBIN: 13.6 g/dL (ref 13.0–17.0)
MCH: 31.8 pg (ref 26.0–34.0)
MCHC: 37 g/dL — AB (ref 30.0–36.0)
MCV: 86 fL (ref 78.0–100.0)
Platelets: 151 10*3/uL (ref 150–400)
RBC: 4.28 MIL/uL (ref 4.22–5.81)
RDW: 13.8 % (ref 11.5–15.5)
WBC: 8.8 10*3/uL (ref 4.0–10.5)

## 2016-12-09 SURGERY — ARTHROPLASTY, HIP, TOTAL, ANTERIOR APPROACH
Anesthesia: Spinal | Site: Hip | Laterality: Left

## 2016-12-09 MED ORDER — OXYCODONE HCL 5 MG PO TABS: 5.0000 mg | ORAL_TABLET | Freq: Once | ORAL | Status: DC | PRN

## 2016-12-09 MED ORDER — HYDROCHLOROTHIAZIDE 25 MG PO TABS
25.0000 mg | ORAL_TABLET | Freq: Every day | ORAL | Status: DC
  Administered 2016-12-10: 10:00:00 25 mg via ORAL
  Filled 2016-12-09: qty 1

## 2016-12-09 MED ORDER — LIDOCAINE 2% (20 MG/ML) 5 ML SYRINGE
INTRAMUSCULAR | Status: AC
  Filled 2016-12-09: qty 10

## 2016-12-09 MED ORDER — METOCLOPRAMIDE HCL 5 MG PO TABS: 5.0000 mg | ORAL_TABLET | Freq: Three times a day (TID) | ORAL | Status: DC | PRN

## 2016-12-09 MED ORDER — ACETAMINOPHEN 500 MG PO TABS
1000.0000 mg | ORAL_TABLET | Freq: Four times a day (QID) | ORAL | Status: DC
  Administered 2016-12-09 – 2016-12-10 (×3): 1000 mg via ORAL
  Filled 2016-12-09 (×3): qty 2

## 2016-12-09 MED ORDER — CEFAZOLIN SODIUM-DEXTROSE 2-4 GM/100ML-% IV SOLN
2.0000 g | Freq: Four times a day (QID) | INTRAVENOUS | Status: AC
  Administered 2016-12-09 (×2): 2 g via INTRAVENOUS
  Filled 2016-12-09 (×2): qty 100

## 2016-12-09 MED ORDER — SODIUM CHLORIDE 0.9 % IV SOLN
INTRAVENOUS | Status: DC
  Administered 2016-12-09: 16:00:00 100 mL/h via INTRAVENOUS
  Administered 2016-12-10: 02:00:00 via INTRAVENOUS

## 2016-12-09 MED ORDER — METHOCARBAMOL 500 MG PO TABS
500.0000 mg | ORAL_TABLET | Freq: Four times a day (QID) | ORAL | Status: DC | PRN
  Administered 2016-12-10: 08:00:00 500 mg via ORAL
  Filled 2016-12-09: qty 1

## 2016-12-09 MED ORDER — CHLORHEXIDINE GLUCONATE 4 % EX LIQD: 60.0000 mL | Freq: Once | CUTANEOUS | Status: DC

## 2016-12-09 MED ORDER — ONDANSETRON HCL 4 MG/2ML IJ SOLN
INTRAMUSCULAR | Status: AC
  Filled 2016-12-09: qty 2

## 2016-12-09 MED ORDER — POLYETHYLENE GLYCOL 3350 17 G PO PACK: 17.0000 g | PACK | Freq: Every day | ORAL | Status: DC | PRN

## 2016-12-09 MED ORDER — ACETAMINOPHEN 325 MG PO TABS: 650.0000 mg | ORAL_TABLET | Freq: Four times a day (QID) | ORAL | Status: DC | PRN

## 2016-12-09 MED ORDER — OXYCODONE HCL 5 MG PO TABS
5.0000 mg | ORAL_TABLET | ORAL | Status: DC | PRN
  Administered 2016-12-09 – 2016-12-10 (×4): 5 mg via ORAL
  Filled 2016-12-09 (×5): qty 1

## 2016-12-09 MED ORDER — TRAMADOL HCL 50 MG PO TABS: 50.0000 mg | ORAL_TABLET | Freq: Four times a day (QID) | ORAL | Status: DC | PRN

## 2016-12-09 MED ORDER — BUPIVACAINE HCL (PF) 0.5 % IJ SOLN
INTRAMUSCULAR | Status: DC | PRN
  Administered 2016-12-09: 3 mL

## 2016-12-09 MED ORDER — CEFAZOLIN SODIUM-DEXTROSE 2-4 GM/100ML-% IV SOLN
INTRAVENOUS | Status: AC
  Filled 2016-12-09: qty 100

## 2016-12-09 MED ORDER — PANTOPRAZOLE SODIUM 40 MG PO TBEC
40.0000 mg | DELAYED_RELEASE_TABLET | Freq: Every day | ORAL | Status: DC
  Administered 2016-12-10: 40 mg via ORAL
  Filled 2016-12-09: qty 1

## 2016-12-09 MED ORDER — ONDANSETRON HCL 4 MG PO TABS: 4.0000 mg | ORAL_TABLET | Freq: Four times a day (QID) | ORAL | Status: DC | PRN

## 2016-12-09 MED ORDER — DIPHENHYDRAMINE HCL 12.5 MG/5ML PO ELIX: 12.5000 mg | ORAL_SOLUTION | ORAL | Status: DC | PRN

## 2016-12-09 MED ORDER — MENTHOL 3 MG MT LOZG: 1.0000 | LOZENGE | OROMUCOSAL | Status: DC | PRN

## 2016-12-09 MED ORDER — STERILE WATER FOR IRRIGATION IR SOLN
Status: DC | PRN
  Administered 2016-12-09: 2000 mL

## 2016-12-09 MED ORDER — METHOCARBAMOL 1000 MG/10ML IJ SOLN
500.0000 mg | Freq: Four times a day (QID) | INTRAVENOUS | Status: DC | PRN
  Administered 2016-12-09: 500 mg via INTRAVENOUS
  Filled 2016-12-09: qty 550

## 2016-12-09 MED ORDER — TRANEXAMIC ACID 1000 MG/10ML IV SOLN
INTRAVENOUS | Status: AC | PRN
  Administered 2016-12-09: 2000 mg via TOPICAL

## 2016-12-09 MED ORDER — PROPOFOL 500 MG/50ML IV EMUL
INTRAVENOUS | Status: DC | PRN
  Administered 2016-12-09: 75 ug/kg/min via INTRAVENOUS

## 2016-12-09 MED ORDER — FENTANYL CITRATE (PF) 100 MCG/2ML IJ SOLN
INTRAMUSCULAR | Status: AC
  Filled 2016-12-09: qty 2

## 2016-12-09 MED ORDER — ACETAMINOPHEN 10 MG/ML IV SOLN
INTRAVENOUS | Status: AC
  Filled 2016-12-09: qty 100

## 2016-12-09 MED ORDER — FENTANYL CITRATE (PF) 100 MCG/2ML IJ SOLN
INTRAMUSCULAR | Status: DC | PRN
  Administered 2016-12-09: 100 ug via INTRAVENOUS

## 2016-12-09 MED ORDER — PROPOFOL 10 MG/ML IV BOLUS
INTRAVENOUS | Status: AC
  Filled 2016-12-09: qty 60

## 2016-12-09 MED ORDER — ONDANSETRON HCL 4 MG/2ML IJ SOLN: 4.0000 mg | Freq: Four times a day (QID) | INTRAMUSCULAR | Status: DC | PRN

## 2016-12-09 MED ORDER — 0.9 % SODIUM CHLORIDE (POUR BTL) OPTIME
TOPICAL | Status: DC | PRN
  Administered 2016-12-09: 1000 mL

## 2016-12-09 MED ORDER — ACETAMINOPHEN 650 MG RE SUPP: 650.0000 mg | Freq: Four times a day (QID) | RECTAL | Status: DC | PRN

## 2016-12-09 MED ORDER — PROMETHAZINE HCL 25 MG/ML IJ SOLN: 6.2500 mg | INTRAMUSCULAR | Status: DC | PRN

## 2016-12-09 MED ORDER — HYDROMORPHONE HCL 1 MG/ML IJ SOLN
0.2500 mg | INTRAMUSCULAR | Status: DC | PRN
  Administered 2016-12-09 (×3): 0.5 mg via INTRAVENOUS

## 2016-12-09 MED ORDER — BUPIVACAINE HCL (PF) 0.25 % IJ SOLN
INTRAMUSCULAR | Status: DC | PRN
  Administered 2016-12-09: 30 mL

## 2016-12-09 MED ORDER — OXYCODONE HCL 5 MG/5ML PO SOLN: 5.0000 mg | Freq: Once | ORAL | Status: DC | PRN

## 2016-12-09 MED ORDER — DOCUSATE SODIUM 100 MG PO CAPS
100.0000 mg | ORAL_CAPSULE | Freq: Two times a day (BID) | ORAL | Status: DC
  Administered 2016-12-09 – 2016-12-10 (×2): 100 mg via ORAL
  Filled 2016-12-09 (×2): qty 1

## 2016-12-09 MED ORDER — AMLODIPINE BESYLATE 10 MG PO TABS
10.0000 mg | ORAL_TABLET | Freq: Every day | ORAL | Status: DC
  Filled 2016-12-09: qty 1

## 2016-12-09 MED ORDER — DEXAMETHASONE SODIUM PHOSPHATE 10 MG/ML IJ SOLN
10.0000 mg | Freq: Once | INTRAMUSCULAR | Status: AC
  Administered 2016-12-09: 10 mg via INTRAVENOUS

## 2016-12-09 MED ORDER — HYDROMORPHONE HCL 1 MG/ML IJ SOLN
INTRAMUSCULAR | Status: AC
  Filled 2016-12-09: qty 1

## 2016-12-09 MED ORDER — PHENOL 1.4 % MT LIQD: 1.0000 | OROMUCOSAL | Status: DC | PRN

## 2016-12-09 MED ORDER — EPHEDRINE SULFATE 50 MG/ML IJ SOLN
INTRAMUSCULAR | Status: DC | PRN
  Administered 2016-12-09 (×3): 10 mg via INTRAVENOUS

## 2016-12-09 MED ORDER — PROPOFOL 10 MG/ML IV BOLUS
INTRAVENOUS | Status: AC
  Filled 2016-12-09: qty 40

## 2016-12-09 MED ORDER — FLEET ENEMA 7-19 GM/118ML RE ENEM: 1.0000 | ENEMA | Freq: Once | RECTAL | Status: DC | PRN

## 2016-12-09 MED ORDER — DEXAMETHASONE SODIUM PHOSPHATE 10 MG/ML IJ SOLN
10.0000 mg | Freq: Once | INTRAMUSCULAR | Status: DC
  Filled 2016-12-09: qty 1

## 2016-12-09 MED ORDER — SIMVASTATIN 20 MG PO TABS
20.0000 mg | ORAL_TABLET | Freq: Every day | ORAL | Status: DC
  Administered 2016-12-10: 08:00:00 20 mg via ORAL
  Filled 2016-12-09: qty 1

## 2016-12-09 MED ORDER — BISACODYL 10 MG RE SUPP: 10.0000 mg | Freq: Every day | RECTAL | Status: DC | PRN

## 2016-12-09 MED ORDER — MORPHINE SULFATE (PF) 2 MG/ML IV SOLN: 1.0000 mg | INTRAVENOUS | Status: DC | PRN

## 2016-12-09 MED ORDER — ONDANSETRON HCL 4 MG/2ML IJ SOLN
INTRAMUSCULAR | Status: DC | PRN
  Administered 2016-12-09: 4 mg via INTRAVENOUS

## 2016-12-09 MED ORDER — RIVAROXABAN 10 MG PO TABS
10.0000 mg | ORAL_TABLET | Freq: Every day | ORAL | Status: DC
  Administered 2016-12-10: 08:00:00 10 mg via ORAL
  Filled 2016-12-09: qty 1

## 2016-12-09 MED ORDER — TRANEXAMIC ACID 1000 MG/10ML IV SOLN
2000.0000 mg | Freq: Once | INTRAVENOUS | Status: DC
  Filled 2016-12-09: qty 20

## 2016-12-09 MED ORDER — BUPIVACAINE HCL (PF) 0.25 % IJ SOLN
INTRAMUSCULAR | Status: AC
  Filled 2016-12-09: qty 30

## 2016-12-09 MED ORDER — ACETAMINOPHEN 10 MG/ML IV SOLN
1000.0000 mg | Freq: Once | INTRAVENOUS | Status: AC
  Administered 2016-12-09: 1000 mg via INTRAVENOUS

## 2016-12-09 MED ORDER — CEFAZOLIN SODIUM-DEXTROSE 2-4 GM/100ML-% IV SOLN
2.0000 g | INTRAVENOUS | Status: AC
  Administered 2016-12-09: 2 g via INTRAVENOUS

## 2016-12-09 MED ORDER — METOCLOPRAMIDE HCL 5 MG/ML IJ SOLN: 5.0000 mg | Freq: Three times a day (TID) | INTRAMUSCULAR | Status: DC | PRN

## 2016-12-09 MED ORDER — LACTATED RINGERS IV SOLN
INTRAVENOUS | Status: DC
  Administered 2016-12-09 (×3): via INTRAVENOUS

## 2016-12-09 SURGICAL SUPPLY — 39 items
BAG DECANTER FOR FLEXI CONT (MISCELLANEOUS) ×3 IMPLANT
BAG ZIPLOCK 12X15 (MISCELLANEOUS) IMPLANT
BLADE SAG 18X100X1.27 (BLADE) ×3 IMPLANT
CAPT HIP TOTAL 2 ×3 IMPLANT
CLOSURE WOUND 1/2 X4 (GAUZE/BANDAGES/DRESSINGS) ×2
CLOTH BEACON ORANGE TIMEOUT ST (SAFETY) ×3 IMPLANT
COVER PERINEAL POST (MISCELLANEOUS) ×3 IMPLANT
COVER SURGICAL LIGHT HANDLE (MISCELLANEOUS) ×3 IMPLANT
DECANTER SPIKE VIAL GLASS SM (MISCELLANEOUS) ×3 IMPLANT
DRAPE STERI IOBAN 125X83 (DRAPES) ×3 IMPLANT
DRAPE U-SHAPE 47X51 STRL (DRAPES) ×6 IMPLANT
DRSG ADAPTIC 3X8 NADH LF (GAUZE/BANDAGES/DRESSINGS) ×3 IMPLANT
DRSG MEPILEX BORDER 4X4 (GAUZE/BANDAGES/DRESSINGS) ×3 IMPLANT
DRSG MEPILEX BORDER 4X8 (GAUZE/BANDAGES/DRESSINGS) ×3 IMPLANT
DURAPREP 26ML APPLICATOR (WOUND CARE) ×3 IMPLANT
ELECT REM PT RETURN 15FT ADLT (MISCELLANEOUS) ×3 IMPLANT
EVACUATOR 1/8 PVC DRAIN (DRAIN) ×3 IMPLANT
GLOVE BIO SURGEON STRL SZ7.5 (GLOVE) ×3 IMPLANT
GLOVE BIO SURGEON STRL SZ8 (GLOVE) ×6 IMPLANT
GLOVE BIOGEL PI IND STRL 7.5 (GLOVE) ×4 IMPLANT
GLOVE BIOGEL PI IND STRL 8 (GLOVE) ×2 IMPLANT
GLOVE BIOGEL PI INDICATOR 7.5 (GLOVE) ×8
GLOVE BIOGEL PI INDICATOR 8 (GLOVE) ×4
GLOVE SURG SS PI 7.5 STRL IVOR (GLOVE) ×6 IMPLANT
GOWN SPEC L3 XXLG W/TWL (GOWN DISPOSABLE) ×3 IMPLANT
GOWN STRL REUS W/ TWL XL LVL3 (GOWN DISPOSABLE) ×1 IMPLANT
GOWN STRL REUS W/TWL LRG LVL3 (GOWN DISPOSABLE) ×3 IMPLANT
GOWN STRL REUS W/TWL XL LVL3 (GOWN DISPOSABLE) ×5 IMPLANT
PACK ANTERIOR HIP CUSTOM (KITS) ×3 IMPLANT
STRIP CLOSURE SKIN 1/2X4 (GAUZE/BANDAGES/DRESSINGS) ×4 IMPLANT
SUT ETHIBOND NAB CT1 #1 30IN (SUTURE) ×3 IMPLANT
SUT MNCRL AB 4-0 PS2 18 (SUTURE) ×3 IMPLANT
SUT STRATAFIX 0 PDS 27 VIOLET (SUTURE) ×3
SUT VIC AB 2-0 CT1 27 (SUTURE) ×4
SUT VIC AB 2-0 CT1 TAPERPNT 27 (SUTURE) ×2 IMPLANT
SUTURE STRATFX 0 PDS 27 VIOLET (SUTURE) ×1 IMPLANT
SYR 50ML LL SCALE MARK (SYRINGE) ×3 IMPLANT
TRAY FOLEY W/METER SILVER 16FR (SET/KITS/TRAYS/PACK) ×3 IMPLANT
YANKAUER SUCT BULB TIP 10FT TU (MISCELLANEOUS) ×3 IMPLANT

## 2016-12-09 NOTE — Progress Notes (Signed)
Dr. Sabra Heck notified of patient's heart rates being in the 65s -blood pressures stable

## 2016-12-09 NOTE — Anesthesia Postprocedure Evaluation (Signed)
Anesthesia Post Note  Patient: Jeremiah Kelley  Procedure(s) Performed: Procedure(s) (LRB): LEFT TOTAL HIP ARTHROPLASTY ANTERIOR APPROACH (Left)     Patient location during evaluation: PACU Anesthesia Type: Spinal Level of consciousness: oriented and awake and alert Pain management: pain level controlled Vital Signs Assessment: post-procedure vital signs reviewed and stable Respiratory status: spontaneous breathing and respiratory function stable Cardiovascular status: blood pressure returned to baseline and stable Postop Assessment: no headache and no backache Anesthetic complications: no    Last Vitals:  Vitals:   12/09/16 1345 12/09/16 1352  BP: 116/76   Pulse: (!) 48 (!) 54  Resp: 14 14  Temp: 36.3 C     Last Pain:  Vitals:   12/09/16 1352  TempSrc:   PainSc: Export

## 2016-12-09 NOTE — Anesthesia Preprocedure Evaluation (Signed)
Anesthesia Evaluation  Patient identified by MRN, date of birth, ID band Patient awake    Reviewed: Allergy & Precautions, NPO status , Patient's Chart, lab work & pertinent test results  Airway Mallampati: II  TM Distance: >3 FB Neck ROM: Full    Dental no notable dental hx.    Pulmonary neg pulmonary ROS, Current Smoker,    Pulmonary exam normal breath sounds clear to auscultation       Cardiovascular hypertension, Pt. on medications negative cardio ROS Normal cardiovascular exam Rhythm:Regular Rate:Normal     Neuro/Psych negative neurological ROS  negative psych ROS   GI/Hepatic negative GI ROS, Neg liver ROS, GERD  ,  Endo/Other  negative endocrine ROS  Renal/GU negative Renal ROS  negative genitourinary   Musculoskeletal negative musculoskeletal ROS (+) Arthritis ,   Abdominal   Peds negative pediatric ROS (+)  Hematology negative hematology ROS (+)   Anesthesia Other Findings   Reproductive/Obstetrics negative OB ROS                             Anesthesia Physical Anesthesia Plan  ASA: II  Anesthesia Plan: Spinal   Post-op Pain Management:    Induction:   PONV Risk Score and Plan: 0 and Ondansetron  Airway Management Planned: Simple Face Mask  Additional Equipment:   Intra-op Plan:   Post-operative Plan:   Informed Consent: I have reviewed the patients History and Physical, chart, labs and discussed the procedure including the risks, benefits and alternatives for the proposed anesthesia with the patient or authorized representative who has indicated his/her understanding and acceptance.   Dental advisory given  Plan Discussed with: CRNA  Anesthesia Plan Comments:         Anesthesia Quick Evaluation

## 2016-12-09 NOTE — Discharge Instructions (Addendum)
° °Dr. Frank [name] °Total Joint Specialist °[facility] °[address] °[place], [place] [address] °[phone] ° °ANTERIOR APPROACH TOTAL HIP REPLACEMENT POSTOPERATIVE DIRECTIONS ° ° °Hip Rehabilitation, Guidelines Following Surgery  °The results of a hip operation are greatly improved after range of motion and muscle strengthening exercises. Follow all safety measures which are given to protect your hip. If any of these exercises cause increased pain or swelling in your joint, decrease the amount until you are comfortable again. Then slowly increase the exercises. Call your caregiver if you have problems or questions.  ° °HOME CARE INSTRUCTIONS  °Remove items at home which could result in a fall. This includes throw rugs or furniture in walking pathways.  °· ICE to the affected hip every three hours for 30 minutes at a time and then as needed for pain and swelling.  Continue to use ice on the hip for pain and swelling from surgery. You may notice swelling that will progress down to the foot and ankle.  This is normal after surgery.  Elevate the leg when you are not up walking on it.   °· Continue to use the breathing machine which will help keep your temperature down.  It is common for your temperature to cycle up and down following surgery, especially at night when you are not up moving around and exerting yourself.  The breathing machine keeps your lungs expanded and your temperature down. ° ° °DIET °You may resume your previous home diet once your are discharged from the hospital. ° °DRESSING / WOUND CARE / SHOWERING °You may shower 3 days after surgery, but keep the wounds dry during showering.  You may use an occlusive plastic wrap (Press'n Seal for example), NO SOAKING/SUBMERGING IN THE BATHTUB.  If the bandage gets wet, change with a clean dry gauze.  If the incision gets wet, pat the wound dry with a clean towel. °You may start showering once you are discharged home but do not  submerge the incision under water. Just pat the incision dry and apply a dry gauze dressing on daily. °Change the surgical dressing daily and reapply a dry dressing each time. ° °ACTIVITY °Walk with your walker as instructed. °Use walker as long as suggested by your caregivers. °Avoid periods of inactivity such as sitting longer than an hour when not asleep. This helps prevent blood clots.  °You may resume a sexual relationship in one month or when given the OK by your doctor.  °You may return to work once you are cleared by your doctor.  °Do not drive a car for 6 weeks or until released by you surgeon.  °Do not drive while taking narcotics. ° °WEIGHT BEARING °Weight bearing as tolerated with assist device (walker, cane, etc) as directed, use it as long as suggested by your surgeon or therapist, typically at least 4-6 weeks. ° °POSTOPERATIVE CONSTIPATION PROTOCOL °Constipation - defined medically as fewer than three stools per week and severe constipation as less than one stool per week. ° °One of the most common issues patients have following surgery is constipation.  Even if you have a regular bowel pattern at home, your normal regimen is likely to be disrupted due to multiple reasons following surgery.  Combination of anesthesia, postoperative narcotics, change in appetite and fluid intake all can affect your bowels.  In order to avoid complications following surgery, here are some recommendations in order to help you during your recovery period. ° °Colace (docusate) - Pick up an over-the-counter   form of Colace or another stool softener and take twice a day as long as you are requiring postoperative pain medications.  Take with a full glass of water daily.  If you experience loose stools or diarrhea, hold the colace until you stool forms back up.  If your symptoms do not get better within 1 week or if they get worse, check with your doctor. ° °Dulcolax (bisacodyl) - Pick up over-the-counter and take as directed  by the product packaging as needed to assist with the movement of your bowels.  Take with a full glass of water.  Use this product as needed if not relieved by Colace only.  ° °MiraLax (polyethylene glycol) - Pick up over-the-counter to have on hand.  MiraLax is a solution that will increase the amount of water in your bowels to assist with bowel movements.  Take as directed and can mix with a glass of water, juice, soda, coffee, or tea.  Take if you go more than two days without a movement. °Do not use MiraLax more than once per day. Call your doctor if you are still constipated or irregular after using this medication for 7 days in a row. ° °If you continue to have problems with postoperative constipation, please contact the office for further assistance and recommendations.  If you experience "the worst abdominal pain ever" or develop nausea or vomiting, please contact the office immediatly for further recommendations for treatment. ° °ITCHING ° If you experience itching with your medications, try taking only a single pain pill, or even half a pain pill at a time.  You can also use Benadryl over the counter for itching or also to help with sleep.  ° °TED HOSE STOCKINGS °Wear the elastic stockings on both legs for three weeks following surgery during the day but you may remove then at night for sleeping. ° °MEDICATIONS °See your medication summary on the “After Visit Summary” that the nursing staff will review with you prior to discharge.  You may have some home medications which will be placed on hold until you complete the course of blood thinner medication.  It is important for you to complete the blood thinner medication as prescribed by your surgeon.  Continue your approved medications as instructed at time of discharge. ° °PRECAUTIONS °If you experience chest pain or shortness of breath - call 911 immediately for transfer to the hospital emergency department.  °If you develop a fever greater that 101 F,  purulent drainage from wound, increased redness or drainage from wound, foul odor from the wound/dressing, or calf pain - CONTACT YOUR SURGEON.   °                                                °FOLLOW-UP APPOINTMENTS °Make sure you keep all of your appointments after your operation with your surgeon and caregivers. You should call the office at the above phone number and make an appointment for approximately two weeks after the date of your surgery or on the date instructed by your surgeon outlined in the "After Visit Summary". ° °RANGE OF MOTION AND STRENGTHENING EXERCISES  °These exercises are designed to help you keep full movement of your hip joint. Follow your caregiver's or physical therapist's instructions. Perform all exercises about fifteen times, three times per day or as directed. Exercise both hips, even if you   have had only one joint replacement. These exercises can be done on a training (exercise) mat, on the floor, on a table or on a bed. Use whatever works the best and is most comfortable for you. Use music or television while you are exercising so that the exercises are a pleasant break in your day. This will make your life better with the exercises acting as a break in routine you can look forward to.  °Lying on your back, slowly slide your foot toward your buttocks, raising your knee up off the floor. Then slowly slide your foot back down until your leg is straight again.  °Lying on your back spread your legs as far apart as you can without causing discomfort.  °Lying on your side, raise your upper leg and foot straight up from the floor as far as is comfortable. Slowly lower the leg and repeat.  °Lying on your back, tighten up the muscle in the front of your thigh (quadriceps muscles). You can do this by keeping your leg straight and trying to raise your heel off the floor. This helps strengthen the largest muscle supporting your knee.  °Lying on your back, tighten up the muscles of your  buttocks both with the legs straight and with the knee bent at a comfortable angle while keeping your heel on the floor.  ° °IF YOU ARE TRANSFERRED TO A SKILLED REHAB FACILITY °If the patient is transferred to a skilled rehab facility following release from the hospital, a list of the current medications will be sent to the facility for the patient to continue.  When discharged from the skilled rehab facility, please have the facility set up the patient's Home Health Physical Therapy prior to being released. Also, the skilled facility will be responsible for providing the patient with their medications at time of release from the facility to include their pain medication, the muscle relaxants, and their blood thinner medication. If the patient is still at the rehab facility at time of the two week follow up appointment, the skilled rehab facility will also need to assist the patient in arranging follow up appointment in our office and any transportation needs. ° °MAKE SURE YOU:  °Understand these instructions.  °Get help right away if you are not doing well or get worse.  ° ° °Pick up stool softner and laxative for home use following surgery while on pain medications. °Do not submerge incision under water. °Please use good hand washing techniques while changing dressing each day. °May shower starting three days after surgery. °Please use a clean towel to pat the incision dry following showers. °Continue to use ice for pain and swelling after surgery. °Do not use any lotions or creams on the incision until instructed by your surgeon. ° °Take Xarelto for two and a half more weeks following discharge from the hospital, then discontinue Xarelto. °Once the patient has completed the Xarelto, they may resume the 81 mg Aspirin. ° ° ° ° ° °Information on my medicine - XARELTO® (Rivaroxaban) ° °Why was Xarelto® prescribed for you? °Xarelto® was prescribed for you to reduce the risk of blood clots forming after orthopedic  surgery. The medical term for these abnormal blood clots is venous thromboembolism (VTE). ° °What do you need to know about xarelto® ? °Take your Xarelto® ONCE DAILY at the same time every day. °You may take it either with or without food. ° °If you have difficulty swallowing the tablet whole, you may crush it and mix in applesauce   just prior to taking your dose. ° °Take Xarelto® exactly as prescribed by your doctor and DO NOT stop taking Xarelto® without talking to the doctor who prescribed the medication.  Stopping without other VTE prevention medication to take the place of Xarelto® may increase your risk of developing a clot. ° °After discharge, you should have regular check-up appointments with your healthcare provider that is prescribing your Xarelto®.   ° °What do you do if you miss a dose? °If you miss a dose, take it as soon as you remember on the same day then continue your regularly scheduled once daily regimen the next day. Do not take two doses of Xarelto® on the same day.  ° °Important Safety Information °A possible side effect of Xarelto® is bleeding. You should call your healthcare provider right away if you experience any of the following: °? Bleeding from an injury or your nose that does not stop. °? Unusual colored urine (red or dark brown) or unusual colored stools (red or black). °? Unusual bruising for unknown reasons. °? A serious fall or if you hit your head (even if there is no bleeding). ° °Some medicines may interact with Xarelto® and might increase your risk of bleeding while on Xarelto®. To help avoid this, consult your healthcare provider or pharmacist prior to using any new prescription or non-prescription medications, including herbals, vitamins, non-steroidal anti-inflammatory drugs (NSAIDs) and supplements. ° °This website has more information on Xarelto®: www.xarelto.com. ° ° ° °

## 2016-12-09 NOTE — Progress Notes (Signed)
X-ray results noted 

## 2016-12-09 NOTE — Progress Notes (Signed)
Portable AP Pelvis X-ray done. 

## 2016-12-09 NOTE — Progress Notes (Signed)
CBC and Hgb A1c drawn by lab.

## 2016-12-09 NOTE — H&P (View-Only) (Signed)
Jeremiah Kelley DOB: January 22, 1946 Married / Language: English / Race: Black or African American Male Date of Admission:  12/09/2016 CC:  Left hip pain History of Present Illness The patient is a 71 year old male who comes in for a preoperative History and Physical. The patient is scheduled for a left total hip arthroplasty (anterior) to be performed by Dr. Dione Kelley. [name], MD at [facility] on 12-09-2016. The patient is a 71 year old male who presented with a hip problem. The patient was seen for a second opinion.The patient reports left hip problems including pain symptoms that have been present for 2 year(s). The symptoms began without any known injury. Symptoms reported include hip pain, night pain, stiffness, numbness (in the toes), difficulty flexing hip, difficulty bearing weight and difficulty ambulating The patient reports symptoms radiating to the: left groin, left thigh posteriorly and lower back and left flank area. The patient describes the hip problem as aching.The patient feels as if their symptoms are does feel they are worsening. Symptoms are exacerbated by flexing hip and lying on the affected side. Symptoms are relieved by ice and heat. Current treatment includes nonsteroidal anti-inflammatory drugs (ibuprofen 800mg ). Pertinent medical history includes osteoarthritis (hx right TKA Jan 2017 by Dr. [name]. He states the right knee is doing well). Prior to being seen, the patient was previously evaluated by a colleague. Previous workup for this problem has included hip x-rays. Previous treatment for this problem has included corticosteroid injection (done at the [place]; helped for about two months). He is the caregiver for his wife, who has MS, and does a lot of lifting with her. His left hip is getting progressively worse. He is at a stage now where he barely can do anything because of the hip. He is primary caregiver for his wife, but he is having a more difficult time of doing that also.  The pain is in his groin, radiating down his anterior thigh to his knee. He does not have any instability symptoms. The hip has lost a lot of movement also. He is ready to proceed with surgery. They have been treated conservatively in the past for the above stated problem and despite conservative measures, they continue to have progressive pain and severe functional limitations and dysfunction. They have failed non-operative management including home exercise, medications. It is felt that they would benefit from undergoing total joint replacement. Risks and benefits of the procedure have been discussed with the patient and they elect to proceed with surgery. There are no active contraindications to surgery such as ongoing infection or rapidly progressive neurological disease.  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 "Bad experience"  Family History Cancer  Mother. Liver Disease, Chronic  Maternal Grandmother. Osteoarthritis  Mother. Osteoporosis  Mother.  Social History Children  4 Current drinker  08/27/2016: Currently drinks hard liquor only occasionally per week Current work status  retired Furniture conservator/restorer daily; does other Living situation  live with spouse Marital status  married No history of drug/alcohol rehab  Not under pain contract  Number of flights of stairs before winded  2-3 Tobacco / smoke exposure  08/27/2016: yes Tobacco use  Current every day smoker. 08/27/2016: smoke(d) 1/2 pack(s) per  day uses less than 1/2 can(s) smokeless per week Advance Directives  Living Will  Medication History  Ibuprofen (800MG   Tablet, Oral) Active. AmLODIPine Besylate (10MG  Tablet, Oral) Active. Pantoprazole Sodium (20MG  Tablet DR, Oral) Active. HydroCHLOROthiazide (25MG  Tablet, Oral) Active. Simvastatin (20MG  Tablet, Oral) Active. Aspirin (81MG  Tablet, Oral) Active. Vitamin B12 (Oral) Specific strength unknown - Active. Vitamin D3 (Oral) Specific strength unknown - Active. Centrum Silver Multivitamin Active.  Past Surgical History Arthroscopy of Knee  bilateral Tonsillectomy  Total Knee Replacement  right Left Wrist Surgery   Review of Systems General Not Present- Chills, Fatigue, Fever, Memory Loss, Night Sweats, Weight Gain and Weight Loss. Skin Not Present- Eczema, Hives, Itching, Lesions and Rash. HEENT Not Present- Dentures, Double Vision, Headache, Hearing Loss, Tinnitus and Visual Loss. Respiratory Not Present- Allergies, Chronic Cough, Coughing up blood, Shortness of breath at rest and Shortness of breath with exertion. Cardiovascular Not Present- Chest Pain, Difficulty Breathing Lying Down, Murmur, Palpitations, Racing/skipping heartbeats and Swelling. Gastrointestinal Not Present- Abdominal Pain, Bloody Stool, Constipation, Diarrhea, Difficulty Swallowing, Heartburn, Jaundice, Loss of appetitie, Nausea and Vomiting. Male Genitourinary Not Present- Blood in Urine, Discharge, Flank Pain, Incontinence, Painful Urination, Urgency, Urinary frequency, Urinary Retention, Urinating at Night and Weak urinary stream. Musculoskeletal Present- Joint Pain. Not Present- Back Pain, Joint Swelling, Morning Stiffness, Muscle Pain, Muscle Weakness and Spasms. Neurological Not Present- Blackout spells, Difficulty with balance, Dizziness, Paralysis, Tremor and Weakness. Psychiatric Not Present- Insomnia.  Vitals  Weight: 222 lb Height: 68.5in Body Surface Area: 2.15 m Body Mass Index: 33.26 kg/m  Pulse: 56 (Regular)  Resp.: 14 (Unlabored)  BP: 118/72 (Sitting, Left Arm,  Standard)   Physical Exam General Mental Status -Alert, cooperative and good historian. General Appearance-pleasant, Not in acute distress. Orientation-Oriented X3. Build & Nutrition-Well nourished and Well developed.  Head and Neck Head-normocephalic, atraumatic . Neck Global Assessment - supple, no bruit auscultated on the right, no bruit auscultated on the left.  Eye Vision-Wears corrective lenses. Pupil - Bilateral-Regular and Round. Motion - Bilateral-EOMI.  ENMT Note: upper denture   Chest and Lung Exam Auscultation Breath sounds - clear at anterior chest wall and clear at posterior chest wall. Adventitious sounds - No Adventitious sounds.  Cardiovascular Auscultation Rhythm - Regular rate and rhythm. Heart Sounds - S1 WNL and S2 WNL. Murmurs & Other Heart Sounds - Auscultation of the heart reveals - No Murmurs.  Abdomen Palpation/Percussion Tenderness - Abdomen is non-tender to palpation. Rigidity (guarding) - Abdomen is soft. Auscultation Auscultation of the abdomen reveals - Bowel sounds normal.  Male Genitourinary Note: Not done, not pertinent to present illness   Musculoskeletal Note: On exam, he is in no distress. His right hip has normal range of motion with no tenderness. Left hip can be flexed to about 90, no internal rotation, about 10 of external rotation, 10 to 20 of abduction. He is about 3-7/8 inch short on the left compared to the right. Pulses and sensation are intact. Strength is intact.  RADIOGRAPHS AP pelvis and lateral of the left hip shows severe bone on bone arthritis with large osteophyte formation and subchondral cystic formation. He also had an x-ray taken AP and lateral of lumbar. He does have some degenerative disc disease, but not as bad as what we are seeing with his hip.   Assessment & Plan Primary osteoarthritis of left hip (M16.12)  Note:Surgical Plans: Left Total Hip Replacement - Anterior  Approach  Disposition: Home with help from children  PCP: Dr. Meda Coffee - Patient has been  seen preoperatively and felt to be stable for surgery.  Topical TXA - History of Stroke  Anesthesia Issues: None  Patient was instructed on what medications to stop prior to surgery.  Signed electronically by Joelene Millin, III PA-C

## 2016-12-09 NOTE — Transfer of Care (Signed)
Immediate Anesthesia Transfer of Care Note  Patient: Jeremiah Kelley  Procedure(s) Performed: Procedure(s): LEFT TOTAL HIP ARTHROPLASTY ANTERIOR APPROACH (Left)  Patient Location: PACU  Anesthesia Type:Spinal  Level of Consciousness: awake, alert  and oriented  Airway & Oxygen Therapy: Patient Spontanous Breathing and Patient connected to face mask oxygen  Post-op Assessment: Report given to RN and Post -op Vital signs reviewed and stable  Post vital signs: Reviewed and stable  Last Vitals:  Vitals:   12/09/16 0928  BP: 131/78  Pulse: (!) 59  Resp: 18  Temp: 36.8 C    Last Pain:  Vitals:   12/09/16 0928  TempSrc: Oral         Complications: No apparent anesthesia complications

## 2016-12-09 NOTE — Interval H&P Note (Signed)
History and Physical Interval Note:  12/09/2016 9:36 AM  Jeremiah Kelley  has presented today for surgery, with the diagnosis of left hip osteoarthritis  The various methods of treatment have been discussed with the patient and family. After consideration of risks, benefits and other options for treatment, the patient has consented to  Procedure(s): LEFT TOTAL HIP ARTHROPLASTY ANTERIOR APPROACH (Left) as a surgical intervention .  The patient's history has been reviewed, patient examined, no change in status, stable for surgery.  I have reviewed the patient's chart and labs.  Questions were answered to the patient's satisfaction.     Gearlean Alf

## 2016-12-09 NOTE — Op Note (Signed)
OPERATIVE REPORT- TOTAL HIP ARTHROPLASTY   PREOPERATIVE DIAGNOSIS: Osteoarthritis of the Left hip.   POSTOPERATIVE DIAGNOSIS: Osteoarthritis of the Left  hip.   PROCEDURE: Left total hip arthroplasty, anterior approach.   SURGEON: Gaynelle Arabian, MD   ASSISTANT: Arlee Muslim, PA-C  ANESTHESIA:  Spinal  ESTIMATED BLOOD LOSS:-300 ml    DRAINS: Hemovac x1.   COMPLICATIONS: None   CONDITION: PACU - hemodynamically stable.   BRIEF CLINICAL NOTE: Jeremiah Kelley is a 71 y.o. male who has advanced end-  stage arthritis of their Left hip with progressively worsening pain and  dysfunction.The patient has failed nonoperative management and presents for  total hip arthroplasty.   PROCEDURE IN DETAIL: After successful administration of spinal  anesthetic, the traction boots for the [facility] bed were placed on both  feet and the patient was placed onto the [facility] bed, boots placed into the leg  holders. The Left hip was then isolated from the perineum with plastic  drapes and prepped and draped in the usual sterile fashion. ASIS and  greater trochanter were marked and a oblique incision was made, starting  at about 1 cm lateral and 2 cm distal to the ASIS and coursing towards  the anterior cortex of the femur. The skin was cut with a 10 blade  through subcutaneous tissue to the level of the fascia overlying the  tensor fascia lata muscle. The fascia was then incised in line with the  incision at the junction of the anterior third and posterior 2/3rd. The  muscle was teased off the fascia and then the interval between the TFL  and the rectus was developed. The Hohmann retractor was then placed at  the top of the femoral neck over the capsule. The vessels overlying the  capsule were cauterized and the fat on top of the capsule was removed.  A Hohmann retractor was then placed anterior underneath the rectus  femoris to give exposure to the entire anterior capsule. A T-shaped  capsulotomy  was performed. The edges were tagged and the femoral head  was identified.       Osteophytes are removed off the superior acetabulum.  The femoral neck was then cut in situ with an oscillating saw. Traction  was then applied to the left lower extremity utilizing the [facility]  traction. The femoral head was then removed. Retractors were placed  around the acetabulum and then circumferential removal of the labrum was  performed. Osteophytes were also removed. Reaming starts at 49 mm to  medialize and  Increased in 2 mm increments to 55 mm. We reamed in  approximately 40 degrees of abduction, 20 degrees anteversion. A 56 mm  pinnacle acetabular shell was then impacted in anatomic position under  fluoroscopic guidance with excellent purchase. We did not need to place  any additional dome screws. A 36 mm neutral + 4 marathon liner was then  placed into the acetabular shell.       The femoral lift was then placed along the lateral aspect of the femur  just distal to the vastus ridge. The leg was  externally rotated and capsule  was stripped off the inferior aspect of the femoral neck down to the  level of the lesser trochanter, this was done with electrocautery. The femur was lifted after this was performed. The  leg was then placed in an extended and adducted position essentially delivering the femur. We also removed the capsule superiorly and the piriformis from the piriformis fossa to  gain excellent exposure of the  proximal femur. Rongeur was used to remove some cancellous bone to get  into the lateral portion of the proximal femur for placement of the  initial starter reamer. The starter broaches was placed  the starter broach  and was shown to go down the center of the canal. Broaching  with the  Corail system was then performed starting at size 8, coursing  Up to size 12. A size 12 had excellent torsional and rotational  and axial stability. The trial high offset neck was then placed  with a 36  + 5 trial head. The hip was then reduced. We confirmed that  the stem was in the canal both on AP and lateral x-rays. It also has excellent sizing. The hip was reduced with outstanding stability through full extension and full external rotation.. AP pelvis was taken and the leg lengths were measured and found to be equal. Hip was then dislocated again and the femoral head and neck removed. The  femoral broach was removed. Size 12 Corail stem with a high offset  neck was then impacted into the femur following native anteversion. Has  excellent purchase in the canal. Excellent torsional and rotational and  axial stability. It is confirmed to be in the canal on AP and lateral  fluoroscopic views. The 36 + 5 ceramic head was placed and the hip  reduced with outstanding stability. Again AP pelvis was taken and it  confirmed that the leg lengths were equal. The wound was then copiously  irrigated with saline solution and the capsule reattached and repaired  with Ethibond suture. 30 ml of .25% Bupivicaine was  injected into the capsule and into the edge of the tensor fascia lata as well as subcutaneous tissue. The fascia overlying the tensor fascia lata was then closed with a running #1 V-Loc. Subcu was closed with interrupted 2-0 Vicryl and subcuticular running 4-0 Monocryl. Incision was cleaned  and dried. Steri-Strips and a bulky sterile dressing applied. Hemovac  drain was hooked to suction and then the patient was awakened and transported to  recovery in stable condition.        Please note that a surgical assistant was a medical necessity for this procedure to perform it in a safe and expeditious manner. Assistant was necessary to provide appropriate retraction of vital neurovascular structures and to prevent femoral fracture and allow for anatomic placement of the prosthesis.  Gaynelle Arabian, M.D.

## 2016-12-09 NOTE — Anesthesia Procedure Notes (Signed)
Spinal  Patient location during procedure: OR Start time: 12/09/2016 10:59 AM End time: 12/09/2016 11:04 AM Staffing Anesthesiologist: Candida Peeling RAY Performed: anesthesiologist  Preanesthetic Checklist Completed: patient identified, site marked, surgical consent, pre-op evaluation, timeout performed, IV checked, risks and benefits discussed and monitors and equipment checked Spinal Block Patient position: sitting Prep: DuraPrep Patient monitoring: heart rate, cardiac monitor, continuous pulse ox and blood pressure Approach: midline Location: L3-4 Injection technique: single-shot Needle Needle type: Quincke  Needle gauge: 22 G Needle length: 9 cm

## 2016-12-09 NOTE — Progress Notes (Signed)
CBC results noted- Hgb A1c results still pending

## 2016-12-10 ENCOUNTER — Encounter (HOSPITAL_COMMUNITY): Payer: Self-pay | Admitting: Orthopedic Surgery

## 2016-12-10 LAB — CBC
HEMATOCRIT: 34.6 % — AB (ref 39.0–52.0)
Hemoglobin: 12.3 g/dL — ABNORMAL LOW (ref 13.0–17.0)
MCH: 31.2 pg (ref 26.0–34.0)
MCHC: 35.5 g/dL (ref 30.0–36.0)
MCV: 87.8 fL (ref 78.0–100.0)
Platelets: 164 10*3/uL (ref 150–400)
RBC: 3.94 MIL/uL — ABNORMAL LOW (ref 4.22–5.81)
RDW: 13.8 % (ref 11.5–15.5)
WBC: 12.8 10*3/uL — ABNORMAL HIGH (ref 4.0–10.5)

## 2016-12-10 LAB — BASIC METABOLIC PANEL
Anion gap: 12 (ref 5–15)
BUN: 11 mg/dL (ref 6–20)
CALCIUM: 8.4 mg/dL — AB (ref 8.9–10.3)
CO2: 26 mmol/L (ref 22–32)
Chloride: 99 mmol/L — ABNORMAL LOW (ref 101–111)
Creatinine, Ser: 0.8 mg/dL (ref 0.61–1.24)
GFR calc Af Amer: >60 mL/min (ref >60)
GLUCOSE: 183 mg/dL — AB (ref 65–99)
Potassium: 2.7 mmol/L — CL (ref 3.5–5.1)
Sodium: 137 mmol/L (ref 135–145)

## 2016-12-10 LAB — HEMOGLOBIN A1C
Hgb A1c MFr Bld: 5.7 % — ABNORMAL HIGH (ref 4.8–5.6)
Mean Plasma Glucose: 117 mg/dL

## 2016-12-10 MED ORDER — METHOCARBAMOL 500 MG PO TABS: 500.0000 mg | ORAL_TABLET | Freq: Four times a day (QID) | ORAL | 0 refills | Status: DC | PRN

## 2016-12-10 MED ORDER — OXYCODONE HCL 5 MG PO TABS: 5.0000 mg | ORAL_TABLET | ORAL | 0 refills | Status: DC | PRN

## 2016-12-10 MED ORDER — TRAMADOL HCL 50 MG PO TABS: 50.0000 mg | ORAL_TABLET | Freq: Four times a day (QID) | ORAL | 0 refills | Status: DC | PRN

## 2016-12-10 MED ORDER — RIVAROXABAN 10 MG PO TABS: 10.0000 mg | ORAL_TABLET | Freq: Every day | ORAL | 0 refills | Status: DC

## 2016-12-10 MED ORDER — POTASSIUM CHLORIDE CRYS ER 20 MEQ PO TBCR
40.0000 meq | EXTENDED_RELEASE_TABLET | Freq: Once | ORAL | Status: AC
  Administered 2016-12-10: 08:00:00 40 meq via ORAL
  Filled 2016-12-10: qty 2

## 2016-12-10 NOTE — Evaluation (Signed)
Physical Therapy Evaluation Patient Details Name: Jeremiah Kelley MRN: [id number] DOB: 16-Feb-1946 Today's Date: 12/10/2016   History of Present Illness  71 yo male s/p L THA-direct anterior 12/09/16. Hx of RA, CVA, chronic pain  Clinical Impression  On eval, pt required Min assist for mobility. He walked ~125 feet with a RW. Minimal pain with activity. Plan is for d/c later today. Will have a 2nd session prior to d/c.     Follow Up Recommendations DC plan and follow up therapy as arranged by surgeon ([place] could be beneficial)    Equipment Recommendations  None recommended by PT    Recommendations for Other Services       Precautions / Restrictions Precautions Precautions: Fall Restrictions Weight Bearing Restrictions: No LLE Weight Bearing: Weight bearing as tolerated      Mobility  Bed Mobility Overal bed mobility: Needs Assistance Bed Mobility: Supine to Sit     Supine to sit: HOB elevated;Min guard     General bed mobility comments: close guard for safety.   Transfers Overall transfer level: Needs assistance Equipment used: Rolling walker (2 wheeled) Transfers: Sit to/from Stand Sit to Stand: Min guard         General transfer comment: close guard for safety. VCs safety, technique, hand/LE placement.   Ambulation/Gait Ambulation/Gait assistance: Min guard Ambulation Distance (Feet): 125 Feet Assistive device: Rolling walker (2 wheeled) Gait Pattern/deviations: Step-through pattern;Decreased stride length     General Gait Details: close guard for safety. good gait speed. VCs for posture. Pt tolerated distance well.   Stairs            Wheelchair Mobility    Modified Rankin (Stroke Patients Only)       Balance                                             Pertinent Vitals/Pain Pain Assessment: 0-10 Pain Score: 2  Pain Location: L hip/thigh Pain Descriptors / Indicators: Sore Pain Intervention(s): Monitored during  session;Repositioned;Ice applied    Home Living Family/patient expects to be discharged to:: Private residence Living Arrangements: Children Available Help at Discharge: Family Type of Home: House Home Access: Stairs to enter;Ramped entrance   Entrance Stairs-Number of Steps: 1 Home Layout: One level Home Equipment: Environmental consultant - 2 wheels;Bedside commode      Prior Function Level of Independence: Independent               Hand Dominance        Extremity/Trunk Assessment   Upper Extremity Assessment Upper Extremity Assessment: Overall WFL for tasks assessed    Lower Extremity Assessment Lower Extremity Assessment: Generalized weakness (s/p L THA)    Cervical / Trunk Assessment Cervical / Trunk Assessment: Normal  Communication   Communication: No difficulties  Cognition Arousal/Alertness: Awake/alert Behavior During Therapy: WFL for tasks assessed/performed Overall Cognitive Status: Within Functional Limits for tasks assessed                                        General Comments      Exercises Total Joint Exercises Quad Sets: AROM;Both;Seated Heel Slides: AAROM;Left;10 reps;Supine Hip ABduction/ADduction: AAROM;Left;10 reps;Supine Long Arc Quad: AROM;Left;10 reps;Seated   Assessment/Plan    PT Assessment Patient needs continued PT services  PT Problem List Decreased  strength;Decreased mobility;Decreased range of motion;Decreased activity tolerance;Decreased balance;Decreased knowledge of use of DME;Pain       PT Treatment Interventions DME instruction;Gait training;Therapeutic activities;Therapeutic exercise;Patient/family education;Balance training;Stair training;Functional mobility training    PT Goals (Current goals can be found in the Care Plan section)  Acute Rehab PT Goals Patient Stated Goal: regain independence PT Goal Formulation: With patient Time For Goal Achievement: 12/24/16 Potential to Achieve Goals: Good     Frequency 7X/week   Barriers to discharge        Co-evaluation               AM-PAC PT "6 Clicks" Daily Activity  Outcome Measure Difficulty turning over in bed (including adjusting bedclothes, sheets and blankets)?: A Little Difficulty moving from lying on back to sitting on the side of the bed? : A Little Difficulty sitting down on and standing up from a chair with arms (e.g., wheelchair, bedside commode, etc,.)?: A Little Help needed moving to and from a bed to chair (including a wheelchair)?: A Little Help needed walking in hospital room?: A Little Help needed climbing 3-5 steps with a railing? : A Little 6 Click Score: 18    End of Session Equipment Utilized During Treatment: Gait belt Activity Tolerance: Patient tolerated treatment well Patient left: in chair;with call bell/phone within reach;with family/visitor present   PT Visit Diagnosis: Muscle weakness (generalized) (M62.81);Difficulty in walking, not elsewhere classified (R26.2)    Time: 8381-8403 PT Time Calculation (min) (ACUTE ONLY): 17 min   Charges:   PT Evaluation $PT Eval Low Complexity: 1 Procedure     PT G Codes:          Weston Anna, MPT Pager: [phone]

## 2016-12-10 NOTE — Progress Notes (Signed)
   Subjective: 1 Day Post-Op Procedure(s) (LRB): LEFT TOTAL HIP ARTHROPLASTY ANTERIOR APPROACH (Left) Patient reports pain as mild.   Patient seen in rounds by Dr. Wynelle Link. Patient is well, but has had some minor complaints of pain in the hip, requiring pain medications We will start therapy today.  If they do well with therapy and meets all goals, then will allow home later this afternoon following therapy. Plan is to go Home after hospital stay.  Objective: Vital signs in last 24 hours: Temp:  [97.3 F (36.3 C)-98.3 F (36.8 C)] 98.2 F (36.8 C) (06/14 0531) Pulse Rate:  [47-77] 77 (06/14 0531) Resp:  [9-18] 16 (06/14 0531) BP: (92-135)/(45-83) 113/54 (06/14 0531) SpO2:  [99 %-100 %] 99 % (06/14 0531) Weight:  [102.5 kg (226 lb)] 102.5 kg (226 lb) (06/13 1519)  Intake/Output from previous day:  Intake/Output Summary (Last 24 hours) at 12/10/16 0748 Last data filed at 12/10/16 0531  Gross per 24 hour  Intake             3655 ml  Output             2210 ml  Net             1445 ml    Intake/Output this shift: No intake/output data recorded.  Labs:  Recent Labs  12/09/16 1326 12/10/16 0434  HGB 13.6 12.3*    Recent Labs  12/09/16 1326 12/10/16 0434  WBC 8.8 12.8*  RBC 4.28 3.94*  HCT 36.8* 34.6*  PLT 151 164    Recent Labs  12/10/16 0434  NA 137  K 2.7*  CL 99*  CO2 26  BUN 11  CREATININE 0.80  GLUCOSE 183*  CALCIUM 8.4*   No results for input(s): LABPT, INR in the last 72 hours.  EXAM General - Patient is Alert, Appropriate and Oriented Extremity - Neurovascular intact Sensation intact distally Intact pulses distally Dorsiflexion/Plantar flexion intact Dressing - dressing C/D/I Motor Function - intact, moving foot and toes well on exam.  Hemovac pulled without difficulty.  Past Medical History:  Diagnosis Date  . Arthritis    osteoarthritis knees hips  . GERD (gastroesophageal reflux disease)   . Hyperlipidemia   . Hypertension   .  Stroke ([place])     Assessment/Plan: 1 Day Post-Op Procedure(s) (LRB): LEFT TOTAL HIP ARTHROPLASTY ANTERIOR APPROACH (Left) Principal Problem:   OA (osteoarthritis) of hip  Estimated body mass index is 31.97 kg/m as calculated from the following:   Height as of this encounter: 5' 10.5" (1.791 m).   Weight as of this encounter: 102.5 kg (226 lb). Up with therapy Discharge home with home health  DVT Prophylaxis - Xarelto Weight Bearing As Tolerated left Leg Hemovac Pulled Begin Therapy  If meets goals and able to go home: Up with therapy Discharge home with home health Diet - Cardiac diet Follow up - in 2 weeks Activity - WBAT Disposition - Home Condition Upon Discharge - Good D/C Meds - See DC Summary DVT Prophylaxis - Xarelto  Arlee Muslim, PA-C Orthopaedic Surgery 12/10/2016, 7:48 AM

## 2016-12-10 NOTE — Progress Notes (Signed)
Physical Therapy Treatment Patient Details Name: Priscilla Finklea MRN: [id number] DOB: 1945-08-05 Today's Date: 12/10/2016    History of Present Illness 71 yo male s/p L THA-direct anterior 12/09/16. Hx of RA, CVA, chronic pain    PT Comments    Progressing well with mobility. 2nd session to review/practices exercises, gait training, and stair training. All education completed. Okay to d/c from PT standpoint-made RN aware.    Follow Up Recommendations  DC plan and follow up therapy as arranged by surgeon ([place])     Equipment Recommendations  None recommended by PT    Recommendations for Other Services       Precautions / Restrictions Precautions Precautions: Fall Restrictions Weight Bearing Restrictions: No LLE Weight Bearing: Weight bearing as tolerated    Mobility  Bed Mobility               General bed mobility comments: oob in recliner  Transfers Overall transfer level: Needs assistance Equipment used: Rolling walker (2 wheeled) Transfers: Sit to/from Stand           General transfer comment: close guard for safety. VCs safety, technique, hand/LE placement.   Ambulation/Gait Ambulation/Gait assistance: Min guard Ambulation Distance (Feet): 125 Feet Assistive device: Rolling walker (2 wheeled) Gait Pattern/deviations: Step-to pattern;Step-through pattern;Decreased stride length     General Gait Details: close guard for safety. good gait speed. VCs for posture. Pt tolerated distance well.    Stairs Stairs: Yes   Stair Management: Step to pattern;Forwards;With walker Number of Stairs: 1 General stair comments: close guard for safety. VCs safety, technique, sequence.   Wheelchair Mobility    Modified Rankin (Stroke Patients Only)       Balance                                            Cognition Arousal/Alertness: Awake/alert Behavior During Therapy: WFL for tasks assessed/performed Overall Cognitive Status: Within  Functional Limits for tasks assessed                                        Exercises Total Joint Exercises Hip ABduction/ADduction: AROM;Left;10 reps;Standing Knee Flexion: AROM;Left;10 reps;Standing Marching in Standing: AROM;Both;10 reps;Standing General Exercises - Lower Extremity Heel Raises: AROM;Both;10 reps;Standing    General Comments        Pertinent Vitals/Pain Pain Assessment: 0-10 Pain Score: 5  Pain Location: L hip/thigh Pain Descriptors / Indicators: Sore;Tightness Pain Intervention(s): Monitored during session;Repositioned    Home Living                      Prior Function            PT Goals (current goals can now be found in the care plan section) Progress towards PT goals: Progressing toward goals    Frequency    7X/week      PT Plan Current plan remains appropriate    Co-evaluation              AM-PAC PT "6 Clicks" Daily Activity  Outcome Measure  Difficulty turning over in bed (including adjusting bedclothes, sheets and blankets)?: A Little Difficulty moving from lying on back to sitting on the side of the bed? : A Little Difficulty sitting down on and standing up from a chair with arms (  e.g., wheelchair, bedside commode, etc,.)?: A Little Help needed moving to and from a bed to chair (including a wheelchair)?: A Little Help needed walking in hospital room?: A Little Help needed climbing 3-5 steps with a railing? : A Little 6 Click Score: 18    End of Session Equipment Utilized During Treatment: Gait belt Activity Tolerance: Patient tolerated treatment well Patient left: in chair;with call bell/phone within reach;with family/visitor present   PT Visit Diagnosis: Muscle weakness (generalized) (M62.81);Difficulty in walking, not elsewhere classified (R26.2)     Time: 9147-8295 PT Time Calculation (min) (ACUTE ONLY): 8 min  Charges:  $Gait Training: 8-22 mins                    G Codes:            Weston Anna, MPT Pager: [phone]

## 2016-12-10 NOTE — Discharge Summary (Signed)
Physician Discharge Summary   Patient ID: Jeremiah Kelley MRN: [id number] DOB/AGE: Sep 12, 1945 71 y.o.  Admit date: 12/09/2016 Discharge date: 12-10-2016  Primary Diagnosis:  Osteoarthritis of the Left hip.  Admission Diagnoses:  Past Medical History:  Diagnosis Date  . Arthritis    osteoarthritis knees hips  . GERD (gastroesophageal reflux disease)   . Hyperlipidemia   . Hypertension   . Stroke [facility])    Discharge Diagnoses:   Principal Problem:   OA (osteoarthritis) of hip  Estimated body mass index is 31.97 kg/m as calculated from the following:   Height as of this encounter: 5' 10.5" (1.791 m).   Weight as of this encounter: 102.5 kg (226 lb).  Procedure(s) (LRB): LEFT TOTAL HIP ARTHROPLASTY ANTERIOR APPROACH (Left)   Consults: None  HPI: Jeremiah Kelley is a 71 y.o. male who has advanced end-  stage arthritis of their Left hip with progressively worsening pain and  dysfunction.The patient has failed nonoperative management and presents for  total hip arthroplasty.   Laboratory Data: Admission on 12/09/2016  Component Date Value Ref Range Status  . WBC 12/09/2016 8.8  4.0 - 10.5 K/uL Final  . RBC 12/09/2016 4.28  4.22 - 5.81 MIL/uL Final  . Hemoglobin 12/09/2016 13.6  13.0 - 17.0 g/dL Final  . HCT 12/09/2016 36.8* 39.0 - 52.0 % Final  . MCV 12/09/2016 86.0  78.0 - 100.0 fL Final  . MCH 12/09/2016 31.8  26.0 - 34.0 pg Final  . MCHC 12/09/2016 37.0* 30.0 - 36.0 g/dL Final  . RDW 12/09/2016 13.8  11.5 - 15.5 % Final  . Platelets 12/09/2016 151  150 - 400 K/uL Final  . Hgb A1c MFr Bld 12/09/2016 5.7* 4.8 - 5.6 % Final   Comment: (NOTE)         Pre-diabetes: 5.7 - 6.4         Diabetes: >6.4         Glycemic control for adults with diabetes: <7.0   . Mean Plasma Glucose 12/09/2016 117  mg/dL Final   Comment: (NOTE) Performed At: [facility] [place], [place] [id number] Lindon Romp MD RZ:[id number]   . WBC 12/10/2016 12.8* 4.0 - 10.5  K/uL Final  . RBC 12/10/2016 3.94* 4.22 - 5.81 MIL/uL Final  . Hemoglobin 12/10/2016 12.3* 13.0 - 17.0 g/dL Final  . HCT 12/10/2016 34.6* 39.0 - 52.0 % Final  . MCV 12/10/2016 87.8  78.0 - 100.0 fL Final  . MCH 12/10/2016 31.2  26.0 - 34.0 pg Final  . MCHC 12/10/2016 35.5  30.0 - 36.0 g/dL Final  . RDW 12/10/2016 13.8  11.5 - 15.5 % Final  . Platelets 12/10/2016 164  150 - 400 K/uL Final  . Sodium 12/10/2016 137  135 - 145 mmol/L Final  . Potassium 12/10/2016 2.7* 3.5 - 5.1 mmol/L Final   Comment: REPEATED TO VERIFY CRITICAL RESULT CALLED TO, READ BACK BY AND VERIFIED WITH: FARGO,A. RN AT 3013 12/10/16 MULLINS,T   . Chloride 12/10/2016 99* 101 - 111 mmol/L Final  . CO2 12/10/2016 26  22 - 32 mmol/L Final  . Glucose, Bld 12/10/2016 183* 65 - 99 mg/dL Final  . BUN 12/10/2016 11  6 - 20 mg/dL Final  . Creatinine, Ser 12/10/2016 0.80  0.61 - 1.24 mg/dL Final  . Calcium 12/10/2016 8.4* 8.9 - 10.3 mg/dL Final  . GFR calc non Af Amer 12/10/2016 >60  >60 mL/min Final  . GFR calc Af Amer 12/10/2016 >60  >60 mL/min Final  Comment: (NOTE) The eGFR has been calculated using the CKD EPI equation. This calculation has not been validated in all clinical situations. eGFR's persistently <60 mL/min signify possible Chronic Kidney Disease.   Georgiann Hahn gap 12/10/2016 12  5 - 15 Final  Hospital Outpatient Visit on 12/04/2016  Component Date Value Ref Range Status  . aPTT 12/04/2016 32  24 - 36 seconds Final  . Sodium 12/04/2016 141  135 - 145 mmol/L Final  . Potassium 12/04/2016 3.4* 3.5 - 5.1 mmol/L Final  . Chloride 12/04/2016 101  101 - 111 mmol/L Final  . CO2 12/04/2016 32  22 - 32 mmol/L Final  . Glucose, Bld 12/04/2016 102* 65 - 99 mg/dL Final  . BUN 12/04/2016 11  6 - 20 mg/dL Final  . Creatinine, Ser 12/04/2016 0.97  0.61 - 1.24 mg/dL Final  . Calcium 12/04/2016 9.3  8.9 - 10.3 mg/dL Final  . Total Protein 12/04/2016 7.4  6.5 - 8.1 g/dL Final  . Albumin 12/04/2016 4.4  3.5 - 5.0 g/dL  Final  . AST 12/04/2016 28  15 - 41 U/L Final  . ALT 12/04/2016 29  17 - 63 U/L Final  . Alkaline Phosphatase 12/04/2016 86  38 - 126 U/L Final  . Total Bilirubin 12/04/2016 0.9  0.3 - 1.2 mg/dL Final  . GFR calc non Af Amer 12/04/2016 >60  >60 mL/min Final  . GFR calc Af Amer 12/04/2016 >60  >60 mL/min Final   Comment: (NOTE) The eGFR has been calculated using the CKD EPI equation. This calculation has not been validated in all clinical situations. eGFR's persistently <60 mL/min signify possible Chronic Kidney Disease.   . Anion gap 12/04/2016 8  5 - 15 Final  . Prothrombin Time 12/04/2016 12.8  11.4 - 15.2 seconds Final  . INR 12/04/2016 0.96   Final  . ABO/RH(D) 12/04/2016 O POS   Final  . Antibody Screen 12/04/2016 NEG   Final  . Sample Expiration 12/04/2016 12/12/2016   Final  . Extend sample reason 12/04/2016 NO TRANSFUSIONS OR PREGNANCY IN THE PAST 3 MONTHS   Final  . MRSA, PCR 12/04/2016 NEGATIVE  NEGATIVE Final  . Staphylococcus aureus 12/04/2016 NEGATIVE  NEGATIVE Final   Comment:        The Xpert SA Assay (FDA approved for NASAL specimens in patients over 25 years of age), is one component of a comprehensive surveillance program.  Test performance has been validated by [facility] for patients greater than or equal to 81 year old. It is not intended to diagnose infection nor to guide or monitor treatment.   . ABO/RH(D) 12/04/2016 O POS   Final  Office Visit on 11/27/2016  Component Date Value Ref Range Status  . Sodium 11/27/2016 144  135 - 146 mmol/L Final  . Potassium 11/27/2016 3.6  3.5 - 5.3 mmol/L Final  . Chloride 11/27/2016 104  98 - 110 mmol/L Final  . CO2 11/27/2016 30  20 - 31 mmol/L Final  . Glucose, Bld 11/27/2016 87  65 - 99 mg/dL Final  . BUN 11/27/2016 11  7 - 25 mg/dL Final  . Creat 11/27/2016 1.05  0.70 - 1.18 mg/dL Final   Comment:   For patients > or = 70 years of age: The upper reference limit for Creatinine is approximately 13% higher for  people identified as African-American.     . Calcium 11/27/2016 9.7  8.6 - 10.3 mg/dL Final  . GFR, Est African American 11/27/2016 83  >=60 mL/min Final  .  GFR, Est Non African American 11/27/2016 72  >=60 mL/min Final  . WBC 11/27/2016 6.5  3.8 - 10.8 K/uL Final  . RBC 11/27/2016 4.89  4.20 - 5.80 MIL/uL Final  . Hemoglobin 11/27/2016 15.5  13.2 - 17.1 g/dL Final  . HCT 11/27/2016 44.5  38.5 - 50.0 % Final  . MCV 11/27/2016 91.0  80.0 - 100.0 fL Final  . MCH 11/27/2016 31.7  27.0 - 33.0 pg Final  . MCHC 11/27/2016 34.8  32.0 - 36.0 g/dL Final  . RDW 11/27/2016 14.5  11.0 - 15.0 % Final  . Platelets 11/27/2016 190  140 - 400 K/uL Final  . MPV 11/27/2016 11.1  7.5 - 12.5 fL Final  . Color, Urine 11/27/2016 DARK YELLOW  YELLOW Final  . APPearance 11/27/2016 CLEAR  CLEAR Final  . Specific Gravity, Urine 11/27/2016 1.027  1.001 - 1.035 Final  . pH 11/27/2016 6.0  5.0 - 8.0 Final  . Glucose, UA 11/27/2016 NEGATIVE  NEGATIVE Final  . Bilirubin Urine 11/27/2016 NEGATIVE  NEGATIVE Final  . Ketones, ur 11/27/2016 NEGATIVE  NEGATIVE Final  . Hgb urine dipstick 11/27/2016 NEGATIVE  NEGATIVE Final  . Protein, ur 11/27/2016 NEGATIVE  NEGATIVE Final  . Nitrite 11/27/2016 NEGATIVE  NEGATIVE Final  . Leukocytes, UA 11/27/2016 NEGATIVE  NEGATIVE Final     X-Rays:Dg Pelvis Portable  Result Date: 12/09/2016 CLINICAL DATA:  71 year old male status post left hip replacement, anterior approach. EXAM: PORTABLE PELVIS 1-2 VIEWS COMPARISON:  None. FINDINGS: Portable AP view at 1308 hours. Sequelae of left hip arthroplasty with bipolar appearing hardware. Postoperative drain in place. Hardware appears intact and normally aligned in the AP view. No surrounding unexpected osseous changes identified. The remainder the pelvis appears intact. Degenerative changes at the right hip. Grossly intact proximal right femur. IMPRESSION: Sequelae of left hip arthroplasty with no adverse features. Electronically Signed    By: Genevie Ann M.D.   On: 12/09/2016 13:27   Dg C-arm 1-60 Min-no Report  Result Date: 12/09/2016 Fluoroscopy was utilized by the requesting physician.  No radiographic interpretation.    EKG: Orders placed or performed in visit on 11/27/16  . EKG 12-Lead     Hospital Course: Patient was admitted to [facility] and taken to the OR and underwent the above state procedure without complications.  Patient tolerated the procedure well and was later transferred to the recovery room and then to the orthopaedic floor for postoperative care.  They were given PO and IV analgesics for pain control following their surgery.  They were given 24 hours of postoperative antibiotics of  Anti-infectives    Start     Dose/Rate Route Frequency Ordered Stop   12/09/16 1700  ceFAZolin (ANCEF) IVPB 2g/100 mL premix     2 g 200 mL/hr over 30 Minutes Intravenous Every 6 hours 12/09/16 1526 12/10/16 0003   12/09/16 0905  ceFAZolin (ANCEF) 2-4 GM/100ML-% IVPB    Comments:  Bridget Hartshorn   : cabinet override      12/09/16 0905 12/09/16 1101   12/09/16 0903  ceFAZolin (ANCEF) IVPB 2g/100 mL premix     2 g 200 mL/hr over 30 Minutes Intravenous On call to O.R. 12/09/16 2202 12/09/16 1131     and started on DVT prophylaxis in the form of Xarelto.   PT and OT were ordered for total hip protocol.  The patient was allowed to be WBAT with therapy. Discharge planning was consulted to help with postop disposition and equipment needs.  Patient  had a good night on the evening of surgery.  They started to get up OOB with therapy on day one.  Hemovac drain was pulled without difficulty. Patient was seen in rounds by Dr. Wynelle Link and was felt to be ready to go home that afternoon.  Discharge home with home health Diet - Cardiac diet Follow up - in 2 weeks Activity - WBAT Disposition - Home Condition Upon Discharge - stable D/C Meds - See DC Summary DVT Prophylaxis - Xarelto   Discharge Instructions    Call MD /  Call 911    Complete by:  As directed    If you experience chest pain or shortness of breath, CALL 911 and be transported to the hospital emergency room.  If you develope a fever above 101 F, pus (white drainage) or increased drainage or redness at the wound, or calf pain, call your surgeon's office.   Change dressing    Complete by:  As directed    You may change your dressing dressing daily with sterile 4 x 4 inch gauze dressing and paper tape.  Do not submerge the incision under water.   Constipation Prevention    Complete by:  As directed    Drink plenty of fluids.  Prune juice may be helpful.  You may use a stool softener, such as Colace (over the counter) 100 mg twice a day.  Use MiraLax (over the counter) for constipation as needed.   Diet - low sodium heart healthy    Complete by:  As directed    Discharge instructions    Complete by:  As directed    Take Xarelto for two and a half more weeks, then discontinue Xarelto. Once the patient has completed the Xarelto, they may resume the 81 mg Aspirin.   Pick up stool softner and laxative for home use following surgery while on pain medications. Do not submerge incision under water. Please use good hand washing techniques while changing dressing each day. May shower starting three days after surgery. Please use a clean towel to pat the incision dry following showers. Continue to use ice for pain and swelling after surgery. Do not use any lotions or creams on the incision until instructed by your surgeon.  Wear both TED hose on both legs during the day every day for three weeks, but may remove the TED hose at night at home.  Postoperative Constipation Protocol  Constipation - defined medically as fewer than three stools per week and severe constipation as less than one stool per week.  One of the most common issues patients have following surgery is constipation.  Even if you have a regular bowel pattern at home, your normal regimen  is likely to be disrupted due to multiple reasons following surgery.  Combination of anesthesia, postoperative narcotics, change in appetite and fluid intake all can affect your bowels.  In order to avoid complications following surgery, here are some recommendations in order to help you during your recovery period.  Colace (docusate) - Pick up an over-the-counter form of Colace or another stool softener and take twice a day as long as you are requiring postoperative pain medications.  Take with a full glass of water daily.  If you experience loose stools or diarrhea, hold the colace until you stool forms back up.  If your symptoms do not get better within 1 week or if they get worse, check with your doctor.  Dulcolax (bisacodyl) - Pick up over-the-counter and take as directed by  the product packaging as needed to assist with the movement of your bowels.  Take with a full glass of water.  Use this product as needed if not relieved by Colace only.   MiraLax (polyethylene glycol) - Pick up over-the-counter to have on hand.  MiraLax is a solution that will increase the amount of water in your bowels to assist with bowel movements.  Take as directed and can mix with a glass of water, juice, soda, coffee, or tea.  Take if you go more than two days without a movement. Do not use MiraLax more than once per day. Call your doctor if you are still constipated or irregular after using this medication for 7 days in a row.  If you continue to have problems with postoperative constipation, please contact the office for further assistance and recommendations.  If you experience "the worst abdominal pain ever" or develop nausea or vomiting, please contact the office immediatly for further recommendations for treatment.   Do not sit on low chairs, stoools or toilet seats, as it may be difficult to get up from low surfaces    Complete by:  As directed    Driving restrictions    Complete by:  As directed    No driving  until released by the physician.   Increase activity slowly as tolerated    Complete by:  As directed    Lifting restrictions    Complete by:  As directed    No lifting until released by the physician.   Patient may shower    Complete by:  As directed    You may shower without a dressing once there is no drainage.  Do not wash over the wound.  If drainage remains, do not shower until drainage stops.   TED hose    Complete by:  As directed    Use stockings (TED hose) for 3 weeks on both leg(s).  You may remove them at night for sleeping.   Weight bearing as tolerated    Complete by:  As directed    Laterality:  left   Extremity:  Lower     Allergies as of 12/10/2016   No Known Allergies     Medication List    STOP taking these medications   aspirin-acetaminophen-caffeine 250-250-65 MG tablet Commonly known as:  EXCEDRIN MIGRAINE   ibuprofen 800 MG tablet Commonly known as:  ADVIL,MOTRIN   multivitamin with minerals Tabs tablet   VITAMIN B 12 PO   Vitamin D3 2000 units Tabs     TAKE these medications   amLODipine 10 MG tablet Commonly known as:  NORVASC Take 10 mg by mouth daily.   aspirin EC 81 MG tablet Take 81 mg by mouth daily.   hydrochlorothiazide 25 MG tablet Commonly known as:  HYDRODIURIL Take 25 mg by mouth daily.   methocarbamol 500 MG tablet Commonly known as:  ROBAXIN Take 1 tablet (500 mg total) by mouth every 6 (six) hours as needed for muscle spasms.   OPTIVE 0.5-0.9 % Soln Generic drug:  Carboxymethylcellul-Glycerin Place 1 drop into both eyes 2 (two) times daily as needed (DRY EYES).   oxyCODONE 5 MG immediate release tablet Commonly known as:  Oxy IR/ROXICODONE Take 1-2 tablets (5-10 mg total) by mouth every 4 (four) hours as needed for moderate pain or severe pain.   pantoprazole 40 MG tablet Commonly known as:  PROTONIX Take 40 mg by mouth daily.   rivaroxaban 10 MG Tabs tablet Commonly known as:  XARELTO Take 1 tablet (10 mg  total) by mouth daily with breakfast. Take Xarelto for two and a half more weeks following discharge from the hospital, then discontinue Xarelto. Once the patient has completed the Xarelto, they may resume the 81 mg Aspirin.   simvastatin 20 MG tablet Commonly known as:  ZOCOR Take 20 mg by mouth daily.   traMADol 50 MG tablet Commonly known as:  ULTRAM Take 1-2 tablets (50-100 mg total) by mouth every 6 (six) hours as needed for moderate pain.      Follow-up Information    Gaynelle Arabian, MD. Schedule an appointment as soon as possible for a visit on 12/22/2016.   Specialty:  Orthopedic Surgery Contact information: [address] [place] [address] [phone]           Signed: Arlee Muslim, PA-C Orthopaedic Surgery 12/10/2016, 8:02 AM

## 2016-12-10 NOTE — Progress Notes (Signed)
Discharge plan:  Pt with HHPT order. [facility] offered and [facility]. [facility] aware of referral. Pt states he has a 3in1 and RW at home. Marney Doctor RN,BSN,NCM [phone]

## 2017-12-02 ENCOUNTER — Encounter: Payer: Self-pay | Admitting: Family Medicine

## 2017-12-03 ENCOUNTER — Encounter: Payer: Self-pay | Admitting: Family Medicine

## 2018-03-02 ENCOUNTER — Other Ambulatory Visit: Payer: Self-pay | Admitting: Orthopedic Surgery

## 2018-03-02 DIAGNOSIS — M79642 Pain in left hand: Secondary | ICD-10-CM

## 2018-03-09 ENCOUNTER — Ambulatory Visit
Admission: RE | Admit: 2018-03-09 | Discharge: 2018-03-09 | Disposition: A | Discharge status: Home / Self Care | Payer: Medicare PPO | Source: Ambulatory Visit | Attending: Orthopedic Surgery | Admitting: Orthopedic Surgery

## 2018-03-09 DIAGNOSIS — M79642 Pain in left hand: Secondary | ICD-10-CM

## 2019-04-16 DIAGNOSIS — Z1211 Encounter for screening for malignant neoplasm of colon: Secondary | ICD-10-CM | POA: Diagnosis not present

## 2019-04-16 DIAGNOSIS — Z1212 Encounter for screening for malignant neoplasm of rectum: Secondary | ICD-10-CM | POA: Diagnosis not present

## 2019-05-16 DIAGNOSIS — M1990 Unspecified osteoarthritis, unspecified site: Secondary | ICD-10-CM | POA: Diagnosis not present

## 2019-05-16 DIAGNOSIS — I10 Essential (primary) hypertension: Secondary | ICD-10-CM | POA: Diagnosis not present

## 2019-05-16 DIAGNOSIS — K219 Gastro-esophageal reflux disease without esophagitis: Secondary | ICD-10-CM | POA: Diagnosis not present

## 2019-05-16 DIAGNOSIS — M19042 Primary osteoarthritis, left hand: Secondary | ICD-10-CM | POA: Diagnosis not present

## 2019-05-16 DIAGNOSIS — E782 Mixed hyperlipidemia: Secondary | ICD-10-CM | POA: Diagnosis not present

## 2019-05-16 DIAGNOSIS — M67432 Ganglion, left wrist: Secondary | ICD-10-CM | POA: Diagnosis not present

## 2019-05-16 DIAGNOSIS — G4700 Insomnia, unspecified: Secondary | ICD-10-CM | POA: Diagnosis not present

## 2019-05-16 DIAGNOSIS — M19041 Primary osteoarthritis, right hand: Secondary | ICD-10-CM | POA: Diagnosis not present

## 2019-06-01 DIAGNOSIS — Z96642 Presence of left artificial hip joint: Secondary | ICD-10-CM | POA: Diagnosis not present

## 2019-06-01 DIAGNOSIS — M25562 Pain in left knee: Secondary | ICD-10-CM | POA: Diagnosis not present

## 2019-06-01 DIAGNOSIS — M1712 Unilateral primary osteoarthritis, left knee: Secondary | ICD-10-CM | POA: Diagnosis not present

## 2019-07-06 DIAGNOSIS — R2689 Other abnormalities of gait and mobility: Secondary | ICD-10-CM | POA: Diagnosis not present

## 2019-07-06 DIAGNOSIS — M19042 Primary osteoarthritis, left hand: Secondary | ICD-10-CM | POA: Diagnosis not present

## 2019-07-06 DIAGNOSIS — R600 Localized edema: Secondary | ICD-10-CM | POA: Diagnosis not present

## 2019-07-06 DIAGNOSIS — M1990 Unspecified osteoarthritis, unspecified site: Secondary | ICD-10-CM | POA: Diagnosis not present

## 2019-07-06 DIAGNOSIS — M19041 Primary osteoarthritis, right hand: Secondary | ICD-10-CM | POA: Diagnosis not present

## 2019-07-06 DIAGNOSIS — I10 Essential (primary) hypertension: Secondary | ICD-10-CM | POA: Diagnosis not present

## 2019-07-13 ENCOUNTER — Encounter (HOSPITAL_COMMUNITY): Payer: Self-pay | Admitting: Physical Therapy

## 2019-07-13 ENCOUNTER — Ambulatory Visit (HOSPITAL_COMMUNITY): Payer: Medicare HMO | Attending: Internal Medicine | Admitting: Physical Therapy

## 2019-07-13 ENCOUNTER — Other Ambulatory Visit: Payer: Self-pay

## 2019-07-13 ENCOUNTER — Encounter (INDEPENDENT_AMBULATORY_CARE_PROVIDER_SITE_OTHER): Payer: Self-pay

## 2019-07-13 DIAGNOSIS — R2689 Other abnormalities of gait and mobility: Secondary | ICD-10-CM | POA: Diagnosis not present

## 2019-07-13 DIAGNOSIS — M6281 Muscle weakness (generalized): Secondary | ICD-10-CM | POA: Insufficient documentation

## 2019-07-13 NOTE — Therapy (Signed)
[place], [place], [address] Phone: [phone]   Fax:  [phone]  Physical Therapy Evaluation  Patient Details  Name: Jeremiah Kelley MRN: [id number] Date of Birth: Aug 16, 1945 Referring Provider (PT): Allyn Kenner MD   Encounter Date: 07/13/2019  PT End of Session - 07/13/19 1625    Visit Number  1    Number of Visits  8    Date for PT Re-Evaluation  08/11/19    Authorization Type  [employer] MCR (Submitted auth 07/13/19)    Authorization Time Period  07/13/19-08/11/19    Authorization - Visit Number  1    Authorization - Number of Visits  1    PT Start Time  [id number]    PT Stop Time  1610    PT Time Calculation (min)  55 min    Activity Tolerance  Patient tolerated treatment well    Behavior During Therapy  [facility] for tasks assessed/performed       Past Medical History:  Diagnosis Date  . Arthritis    osteoarthritis knees hips  . GERD (gastroesophageal reflux disease)   . Hyperlipidemia   . Hypertension   . Stroke [facility])     Past Surgical History:  Procedure Laterality Date  . JOINT REPLACEMENT     right knee  . KNEE SURGERY Right 2017  . TONSILLECTOMY     age 64  . TONSILLECTOMY    . TOTAL HIP ARTHROPLASTY Left 12/09/2016   Procedure: LEFT TOTAL HIP ARTHROPLASTY ANTERIOR APPROACH;  Surgeon: Gaynelle Arabian, MD;  Location: [facility];  Service: Orthopedics;  Laterality: Left;    There were no vitals filed for this visit.   Subjective Assessment - 07/13/19 1525    Subjective  Patient presents to physical therapy with complaint of balance issues he feels is related to stiffness in his RT toe. Patient says this issue began insidiously about 1.5 years ago. Patient notes his RT big toe became stiff (in flexion, no problem with extension) and noted that his balance began to feel off around the same time. Patient states he care for his wife and assists her with transfers, which has also brought his increased awareness to  his balance and mobility deficits. Patient also notes generalized weakness in his legs which he says "are not like they use to be", which limits him from standing and walking for long and has decreased his functional mobility.    Pertinent History  RT TKA, LT THA    Limitations  Lifting;Standing;Walking;House hold activities    How long can you stand comfortably?  10 min    How long can you walk comfortably?  10 min    Patient Stated Goals  to be able to stand and walk without pain    Currently in Pain?  No/denies   Patient denies pain at rest, but notes stiffness that bothers his with activity        [facility] PT Assessment - 07/13/19 0001      Assessment   Medical Diagnosis  balance and RT toe stiffness     Referring Provider (PT)  Allyn Kenner MD    Next MD Visit  --   None scheduled    Prior Therapy  No       Precautions   Precautions  None      Restrictions   Weight Bearing Restrictions  No      Balance Screen   Has the patient fallen in the past 6 months  No    Has the patient had a decrease in activity level because of a fear of falling?   No    Is the patient reluctant to leave their home because of a fear of falling?   No      Home Film/video editor residence    Living Arrangements  Spouse/significant other      Prior Function   Level of Independence  Independent      Cognition   Overall Cognitive Status  Within Functional Limits for tasks assessed      Sensation   Light Touch  Appears Intact      ROM / Strength   AROM / PROM / Strength  AROM;Strength      AROM   AROM Assessment Site  Hip;Knee;Ankle    Right/Left Hip  Right;Left   hip AROM WFL   Right/Left Knee  Right;Left    Right Knee Extension  0    Right Knee Flexion  105    Left Knee Extension  10    Left Knee Flexion  90    Right/Left Ankle  Right;Left    Right Ankle Dorsiflexion  0    Right Ankle Plantar Flexion  40    Left Ankle Dorsiflexion  5    Left Ankle Plantar  Flexion  40      Strength   Strength Assessment Site  Hip;Knee;Ankle    Right/Left Hip  Right;Left    Right Hip Flexion  5/5    Right Hip Extension  4+/5    Right Hip ABduction  4+/5    Left Hip Flexion  5/5    Left Hip Extension  4+/5    Left Hip ABduction  4+/5    Right/Left Knee  Right;Left    Right Knee Flexion  5/5    Right Knee Extension  5/5    Left Knee Flexion  5/5    Left Knee Extension  5/5    Right/Left Ankle  Right;Left   3+/5 in RT great toe flexion    Right Ankle Dorsiflexion  4+/5    Left Ankle Dorsiflexion  5/5      Flexibility   Soft Tissue Assessment /Muscle Length  --   Mod restriciton in bilateral hamstrings and gastroc     Ambulation/Gait   Ambulation/Gait  Yes    Assistive device  None    Gait Pattern  Decreased stride length;Decreased dorsiflexion - right;Decreased step length - right;Trunk flexed    Ambulation Surface  Level;Indoor      Balance   Balance Assessed  Yes      Standardized Balance Assessment   Standardized Balance Assessment  Berg Balance Test      Berg Balance Test   Sit to Stand  Able to stand  independently using hands    Standing Unsupported  Able to stand safely 2 minutes    Sitting with Back Unsupported but Feet Supported on Floor or Stool  Able to sit safely and securely 2 minutes    Stand to Sit  Controls descent by using hands    Transfers  Able to transfer safely, definite need of hands    Standing Unsupported with Eyes Closed  Able to stand 10 seconds safely    Standing Unsupported with Feet Together  Able to place feet together independently and stand 1 minute safely    From Standing, Reach Forward with Outstretched Arm  Can reach forward >12 cm safely (5")  From Standing Position, Pick up Object from [place] to pick up shoe, needs supervision    From Standing Position, Turn to Look Behind Over each Shoulder  Looks behind one side only/other side shows less weight shift    Turn 360 Degrees  Able to turn 360  degrees safely one side only in 4 seconds or less    Standing Unsupported, Alternately Place Feet on Step/Stool  Able to stand independently and safely and complete 8 steps in 20 seconds    Standing Unsupported, One Foot in [place] to place foot tandem independently and hold 30 seconds    Standing on One Leg  Able to lift leg independently and hold > 10 seconds    Total Score  49                Objective measurements completed on examination: See above findings.      [facility] Adult PT Treatment/Exercise - 07/13/19 0001      Exercises   Exercises  Ankle      Ankle Exercises: Stretches   Gastroc Stretch  2 reps;20 seconds    Gastroc Stretch Limitations  at wall      Ankle Exercises: Seated   Other Seated Ankle Exercises  great toe plantarflexion with red band x10             PT Education - 07/13/19 1531    Education Details  Patient educated on evaluation findings, POC and HEP    Person(s) Educated  Patient    Methods  Explanation;Handout    Comprehension  Verbalized understanding       PT Short Term Goals - 07/13/19 1632      PT SHORT TERM GOAL #1   Title  Patient will be independent with initial HEP to improve functional outcomes    Time  2    Period  Weeks    Status  New    Target Date  07/28/19        PT Long Term Goals - 07/13/19 1633      PT LONG TERM GOAL #1   Title  Patient will report at least 75% overall improvement in subjective complaint to indicate improvement in ability to perform ADLs.    Time  4    Period  Weeks    Status  New    Target Date  08/11/19      PT LONG TERM GOAL #2   Title  Patient will improve BERG score to at least 55/56 to demo improvemetn in balance and reduced risk for falls.    Time  4    Period  Weeks    Status  New    Target Date  08/11/19      PT LONG TERM GOAL #3   Title  Patient will be able to ambulate at least 400 feet during 2MWT with LRAD to demonstrate improved ability to perform functional  mobility and associated tasks.    Time  4    Period  Weeks    Status  New    Target Date  08/11/19      PT LONG TERM GOAL #4   Title  Patient will have improved RT toe MMT to at least 4/5 flexion for improved ability to perform functional mobility and associated tasks.    Time  4    Period  Weeks    Status  New    Target Date  08/11/19  Plan - 07/13/19 1628    Clinical Impression Statement  Patient is a 74 y.o. male who presents to physical therapy with complaint of decreased balance, general weakness, and RT toe stiffness. Patient demonstrates decreased strength, ROM restrictions, balance deficits and gait abnormalities which are negatively impacting patient ability to perform ADLs and functional mobility tasks. Patient will benefit from skilled physical therapy services to address these deficits to improve level of function with ADLs, functional mobility tasks, and reduce risk for falls.    Examination-Activity Limitations  Locomotion Level;Transfers;Dressing;Stand;Stairs;Bend;Squat    Examination-Participation Restrictions  Community Activity;Yard Work    Stability/Clinical Decision Making  Stable/Uncomplicated    Designer, jewellery  Low    Rehab Potential  Good    PT Frequency  2x / week    PT Duration  4 weeks    PT Treatment/Interventions  ADLs/Self Care Home Management;Therapeutic exercise;Balance training;Manual techniques;Taping;Therapeutic activities;Functional mobility training;Stair training;Gait training;Patient/family education;Energy conservation;Joint Manipulations;Passive range of motion;Neuromuscular re-education    PT Next Visit Plan  Review gaols and HEP. Progress RLE strength, balance and BLE mobility as tolerated. Add slant board, lunge stretch, hamstring stretch, balance on foam.    PT Home Exercise Plan  07/13/19: calf steretch at wall, great toe plantarflexion with band    Consulted and Agree with Plan of Care  Patient       Patient will  benefit from skilled therapeutic intervention in order to improve the following deficits and impairments:  Abnormal gait, Decreased endurance, Hypomobility, Decreased activity tolerance, Decreased strength, Decreased balance, Difficulty walking, Decreased mobility, Impaired flexibility, Decreased range of motion, Improper body mechanics  Visit Diagnosis: Other abnormalities of gait and mobility  Muscle weakness (generalized)     Problem List Patient Active Problem List   Diagnosis Date Noted  . OA (osteoarthritis) of hip 12/09/2016  . HLD (hyperlipidemia) 09/17/2016  . Essential hypertension 09/17/2016  . Stroke ([place]) 09/17/2016  . Chronic GERD 09/17/2016  . Osteoarthritis 09/17/2016  . Knee joint replacement status, right 09/17/2016  . Colon polyp 09/17/2016   4:41 PM, 07/13/19 Josue Hector PT DPT  Physical Therapist with [facility]  [phone]   [facility] [address] [place], [place], [address] Phone: [phone]   Fax:  [phone]  Name: Jeremiah Kelley MRN: [id number] Date of Birth: 02/22/1946

## 2019-07-13 NOTE — Patient Instructions (Signed)
Access Code: [email or web address]  URL: [email or web address]  Date: 07/13/2019  Prepared by: Josue Hector   Exercises Ankle and Toe Plantarflexion with Resistance - 10 reps - 2 sets - 3 hold - 2x daily - 7x weekly Gastroc Stretch on Wall - 3 reps - 1 sets - 30 hold - 2x daily - 7x weekly Gastroc Stretch with Foot at [place] - 3 reps - 1 sets - 30 hold - 2x daily - 7x weekly

## 2019-07-18 ENCOUNTER — Ambulatory Visit (HOSPITAL_COMMUNITY): Payer: Medicare HMO | Admitting: Physical Therapy

## 2019-07-18 ENCOUNTER — Other Ambulatory Visit: Payer: Self-pay

## 2019-07-18 ENCOUNTER — Encounter (HOSPITAL_COMMUNITY): Payer: Self-pay | Admitting: Physical Therapy

## 2019-07-18 DIAGNOSIS — R2689 Other abnormalities of gait and mobility: Secondary | ICD-10-CM | POA: Diagnosis not present

## 2019-07-18 DIAGNOSIS — M6281 Muscle weakness (generalized): Secondary | ICD-10-CM

## 2019-07-18 NOTE — Therapy (Signed)
[place], [place], [address] Phone: [phone]   Fax:  [phone]  Physical Therapy Treatment  Patient Details  Name: Jeremiah Kelley MRN: [id number] Date of Birth: 1946/05/28 Referring Provider (PT): Allyn Kenner MD   Encounter Date: 07/18/2019  PT End of Session - 07/18/19 1352    Visit Number  2    Number of Visits  8    Date for PT Re-Evaluation  08/11/19    Authorization Type  [employer] MCR (8 visits approved))    Authorization Time Period  07/13/19-08/11/19    Authorization - Visit Number  1    Authorization - Number of Visits  8    PT Start Time  1350    PT Stop Time  1430    PT Time Calculation (min)  40 min    Activity Tolerance  Patient tolerated treatment well    Behavior During Therapy  [facility] for tasks assessed/performed       Past Medical History:  Diagnosis Date  . Arthritis    osteoarthritis knees hips  . GERD (gastroesophageal reflux disease)   . Hyperlipidemia   . Hypertension   . Stroke [facility])     Past Surgical History:  Procedure Laterality Date  . JOINT REPLACEMENT     right knee  . KNEE SURGERY Right 2017  . TONSILLECTOMY     age 73  . TONSILLECTOMY    . TOTAL HIP ARTHROPLASTY Left 12/09/2016   Procedure: LEFT TOTAL HIP ARTHROPLASTY ANTERIOR APPROACH;  Surgeon: Gaynelle Arabian, MD;  Location: [facility];  Service: Orthopedics;  Laterality: Left;    There were no vitals filed for this visit.  Subjective Assessment - 07/18/19 1356    Subjective  Patient says he is doing ok today, says he feels sore all over in general, but notes that his legs don't hurt today. Patient says he has been doing HEP and that band exercise was fairly easy. Patient notes that his upper body and back are sore intermittently from assisting his wife with in home transfers.    Pertinent History  RT TKA, LT THA    Limitations  Lifting;Standing;Walking;House hold activities    How long can you stand comfortably?  10 min     How long can you walk comfortably?  10 min    Patient Stated Goals  to be able to stand and walk without pain    Currently in Pain?  No/denies                       [facility] Adult PT Treatment/Exercise - 07/18/19 0001      Exercises   Exercises  Knee/Hip      Knee/Hip Exercises: Stretches   Passive Hamstring Stretch  Both;5 reps;10 seconds    Passive Hamstring Stretch Limitations  on 12 inch box     Knee: Self-Stretch to increase Flexion  Both;5 reps;10 seconds    Knee: Self-Stretch Limitations  on 12 in step      Knee/Hip Exercises: Machines for Data processing manager walkouts 40# x10      Knee/Hip Exercises: Standing   Heel Raises  Both;2 sets;10 reps    Heel Raises Limitations  eccentric lowering at // bars    Forward Step Up  Both;10 reps;Hand Hold: 0;Step Height: 4"    Step Down  Both;10 reps;Hand Hold: 0;Step Height: 4"    SLS  3 x 10" each on foam  Ankle Exercises: Stretches   Slant Board Stretch  3 reps;30 seconds               PT Short Term Goals - 07/13/19 1632      PT SHORT TERM GOAL #1   Title  Patient will be independent with initial HEP to improve functional outcomes    Time  2    Period  Weeks    Status  New    Target Date  07/28/19        PT Long Term Goals - 07/13/19 1633      PT LONG TERM GOAL #1   Title  Patient will report at least 75% overall improvement in subjective complaint to indicate improvement in ability to perform ADLs.    Time  4    Period  Weeks    Status  New    Target Date  08/11/19      PT LONG TERM GOAL #2   Title  Patient will improve BERG score to at least 55/56 to demo improvemetn in balance and reduced risk for falls.    Time  4    Period  Weeks    Status  New    Target Date  08/11/19      PT LONG TERM GOAL #3   Title  Patient will be able to ambulate at least 400 feet during 2MWT with LRAD to demonstrate improved ability to perform functional mobility and associated  tasks.    Time  4    Period  Weeks    Status  New    Target Date  08/11/19      PT LONG TERM GOAL #4   Title  Patient will have improved RT toe MMT to at least 4/5 flexion for improved ability to perform functional mobility and associated tasks.    Time  4    Period  Weeks    Status  New    Target Date  08/11/19            Plan - 07/18/19 1433    Clinical Impression Statement  Patient tolerated session well today. Initiated treatment today. Reviewed patient goals and HEP. Progressed BLE strengthening in standing and balance activity. Patient educated on proper form and function of all added exercise. Patient noted increased muscle fatigue with step downs form 4 inch step in LT SLS. Patient reported no increased pain with treatment today. Patient progressed to green ther band for home exercise.    Examination-Activity Limitations  Locomotion Level;Transfers;Dressing;Stand;Stairs;Bend;Squat    Examination-Participation Restrictions  Community Activity;Yard Work    Stability/Clinical Decision Making  Stable/Uncomplicated    Rehab Potential  Good    PT Frequency  2x / week    PT Duration  4 weeks    PT Treatment/Interventions  ADLs/Self Care Home Management;Therapeutic exercise;Balance training;Manual techniques;Taping;Therapeutic activities;Functional mobility training;Stair training;Gait training;Patient/family education;Energy conservation;Joint Manipulations;Passive range of motion;Neuromuscular re-education    PT Next Visit Plan  Progress BLE strength and balance activity as tolerated. Add sidestepping next visit.    PT Home Exercise Plan  07/13/19: calf steretch at wall, great toe plantarflexion with band    Consulted and Agree with Plan of Care  Patient       Patient will benefit from skilled therapeutic intervention in order to improve the following deficits and impairments:  Abnormal gait, Decreased endurance, Hypomobility, Decreased activity tolerance, Decreased strength,  Decreased balance, Difficulty walking, Decreased mobility, Impaired flexibility, Decreased range of motion, Improper body mechanics  Visit  Diagnosis: Other abnormalities of gait and mobility  Muscle weakness (generalized)     Problem List Patient Active Problem List   Diagnosis Date Noted  . OA (osteoarthritis) of hip 12/09/2016  . HLD (hyperlipidemia) 09/17/2016  . Essential hypertension 09/17/2016  . Stroke ([place]) 09/17/2016  . Chronic GERD 09/17/2016  . Osteoarthritis 09/17/2016  . Knee joint replacement status, right 09/17/2016  . Colon polyp 09/17/2016   2:35 PM, 07/18/19 Josue Hector PT DPT  Physical Therapist with [facility]  [phone]   [facility] [address] [place], [place], [address] Phone: [phone]   Fax:  [phone]  Name: Jeremiah Kelley MRN: [id number] Date of Birth: 01-02-1946

## 2019-07-20 ENCOUNTER — Ambulatory Visit (HOSPITAL_COMMUNITY): Payer: Medicare HMO | Admitting: Physical Therapy

## 2019-07-20 ENCOUNTER — Encounter (HOSPITAL_COMMUNITY): Payer: Self-pay | Admitting: Physical Therapy

## 2019-07-20 ENCOUNTER — Other Ambulatory Visit: Payer: Self-pay

## 2019-07-20 DIAGNOSIS — R2689 Other abnormalities of gait and mobility: Secondary | ICD-10-CM

## 2019-07-20 DIAGNOSIS — M6281 Muscle weakness (generalized): Secondary | ICD-10-CM | POA: Diagnosis not present

## 2019-07-20 NOTE — Therapy (Signed)
[place], [place], [address] Phone: [phone]   Fax:  [phone]  Physical Therapy Treatment  Patient Details  Name: Jeremiah Kelley MRN: [id number] Date of Birth: 08-31-1945 Referring Provider (PT): Allyn Kenner MD   Encounter Date: 07/20/2019  PT End of Session - 07/20/19 1306    Visit Number  3    Number of Visits  8    Date for PT Re-Evaluation  08/11/19    Authorization Type  [employer] MCR (8 visits approved))    Authorization Time Period  07/13/19-08/11/19    Authorization - Visit Number  2    Authorization - Number of Visits  8    PT Start Time  1302    PT Stop Time  1345    PT Time Calculation (min)  43 min    Activity Tolerance  Patient tolerated treatment well    Behavior During Therapy  [facility] for tasks assessed/performed       Past Medical History:  Diagnosis Date  . Arthritis    osteoarthritis knees hips  . GERD (gastroesophageal reflux disease)   . Hyperlipidemia   . Hypertension   . Stroke [facility])     Past Surgical History:  Procedure Laterality Date  . JOINT REPLACEMENT     right knee  . KNEE SURGERY Right 2017  . TONSILLECTOMY     age 74  . TONSILLECTOMY    . TOTAL HIP ARTHROPLASTY Left 12/09/2016   Procedure: LEFT TOTAL HIP ARTHROPLASTY ANTERIOR APPROACH;  Surgeon: Gaynelle Arabian, MD;  Location: [facility];  Service: Orthopedics;  Laterality: Left;    There were no vitals filed for this visit.  Subjective Assessment - 07/20/19 1306    Subjective  Patient says he felt pretty good after last session. Patient says today he is hurting all over. Patient says he took his Celebrex this morning. Patient reports some muscle soreness in BLEs but no pain currently. Patient says HEP is going well.    Pertinent History  RT TKA, LT THA    Limitations  Lifting;Standing;Walking;House hold activities    How long can you stand comfortably?  10 min    How long can you walk comfortably?  10 min    Patient  Stated Goals  to be able to stand and walk without pain    Currently in Pain?  No/denies                       [facility] Adult PT Treatment/Exercise - 07/20/19 0001      Knee/Hip Exercises: Stretches   Passive Hamstring Stretch  Both;5 reps;10 seconds    Passive Hamstring Stretch Limitations  on 12 inch box     Knee: Self-Stretch to increase Flexion  Both;5 reps;10 seconds    Knee: Self-Stretch Limitations  on 12 in step    Gastroc Stretch  Both;3 reps;30 seconds    Gastroc Stretch Limitations  slant board       Knee/Hip Exercises: Machines for Data processing manager walkouts 50# x10      Knee/Hip Exercises: Standing   Heel Raises  Both;2 sets;10 reps    Heel Raises Limitations  eccentric lowering at // bars    Forward Step Up  Both;10 reps;Hand Hold: 0;Step Height: 6"    Step Down  Both;10 reps;Hand Hold: 0;Step Height: 6"    Rocker Board  1 minute   FWD/ back    Other Standing Knee  Exercises  tandem stance, 3 x 20" on foam       Knee/Hip Exercises: Seated   Sit to Sand  10 reps;without UE support      Knee/Hip Exercises: Prone   Other Prone Exercises  trialed POE 2 min for possible S1 nerve root involvement (no effect)                PT Short Term Goals - 07/13/19 1632      PT SHORT TERM GOAL #1   Title  Patient will be independent with initial HEP to improve functional outcomes    Time  2    Period  Weeks    Status  New    Target Date  07/28/19        PT Long Term Goals - 07/13/19 1633      PT LONG TERM GOAL #1   Title  Patient will report at least 75% overall improvement in subjective complaint to indicate improvement in ability to perform ADLs.    Time  4    Period  Weeks    Status  New    Target Date  08/11/19      PT LONG TERM GOAL #2   Title  Patient will improve BERG score to at least 55/56 to demo improvemetn in balance and reduced risk for falls.    Time  4    Period  Weeks    Status  New    Target Date   08/11/19      PT LONG TERM GOAL #3   Title  Patient will be able to ambulate at least 400 feet during 2MWT with LRAD to demonstrate improved ability to perform functional mobility and associated tasks.    Time  4    Period  Weeks    Status  New    Target Date  08/11/19      PT LONG TERM GOAL #4   Title  Patient will have improved RT toe MMT to at least 4/5 flexion for improved ability to perform functional mobility and associated tasks.    Time  4    Period  Weeks    Status  New    Target Date  08/11/19            Plan - 07/20/19 1345    Clinical Impression Statement  Patient tolerated session well today. Patient was well challenged with tandem balance on foam. Patient demos decreased control with lowering on step downs on LLE due to weakness. Patient was well challenged with rockerboard stability. Patient cued on proper form and function on all added exercise. Patient noted increased muscle fatigue post session, but no increased pain.    Examination-Activity Limitations  Locomotion Level;Transfers;Dressing;Stand;Stairs;Bend;Squat    Examination-Participation Restrictions  Community Activity;Yard Work    Stability/Clinical Decision Making  Stable/Uncomplicated    Rehab Potential  Good    PT Frequency  2x / week    PT Duration  4 weeks    PT Treatment/Interventions  ADLs/Self Care Home Management;Therapeutic exercise;Balance training;Manual techniques;Taping;Therapeutic activities;Functional mobility training;Stair training;Gait training;Patient/family education;Energy conservation;Joint Manipulations;Passive range of motion;Neuromuscular re-education    PT Next Visit Plan  Progress BLE strength and balance activity as tolerated. Add sidestepping next visit.    PT Home Exercise Plan  07/13/19: calf steretch at wall, great toe plantarflexion with band    Consulted and Agree with Plan of Care  Patient       Patient will benefit from skilled therapeutic intervention in order  to  improve the following deficits and impairments:  Abnormal gait, Decreased endurance, Hypomobility, Decreased activity tolerance, Decreased strength, Decreased balance, Difficulty walking, Decreased mobility, Impaired flexibility, Decreased range of motion, Improper body mechanics  Visit Diagnosis: Other abnormalities of gait and mobility  Muscle weakness (generalized)     Problem List Patient Active Problem List   Diagnosis Date Noted  . OA (osteoarthritis) of hip 12/09/2016  . HLD (hyperlipidemia) 09/17/2016  . Essential hypertension 09/17/2016  . Stroke ([place]) 09/17/2016  . Chronic GERD 09/17/2016  . Osteoarthritis 09/17/2016  . Knee joint replacement status, right 09/17/2016  . Colon polyp 09/17/2016   1:49 PM, 07/20/19 Josue Hector PT DPT  Physical Therapist with [facility]  [phone]   [facility] [address] [place], [place], [address] Phone: [phone]   Fax:  [phone]  Name: Jahmi Debruhl MRN: [id number] Date of Birth: [birth year]

## 2019-07-25 ENCOUNTER — Ambulatory Visit (HOSPITAL_COMMUNITY): Payer: Medicare HMO | Admitting: Physical Therapy

## 2019-07-25 ENCOUNTER — Encounter (HOSPITAL_COMMUNITY): Payer: Self-pay | Admitting: Physical Therapy

## 2019-07-25 ENCOUNTER — Other Ambulatory Visit: Payer: Self-pay

## 2019-07-25 DIAGNOSIS — R2689 Other abnormalities of gait and mobility: Secondary | ICD-10-CM

## 2019-07-25 DIAGNOSIS — M6281 Muscle weakness (generalized): Secondary | ICD-10-CM

## 2019-07-25 NOTE — Therapy (Signed)
[place], [place], [address] Phone: [phone]   Fax:  [phone]  Physical Therapy Treatment  Patient Details  Name: Jeremiah Kelley MRN: [id number] Date of Birth: May 09, 1946 Referring Provider (PT): Allyn Kenner MD   Encounter Date: 07/25/2019  PT End of Session - 07/25/19 1403    Visit Number  4    Number of Visits  8    Date for PT Re-Evaluation  08/11/19    Authorization Type  [employer] MCR (8 visits approved))    Authorization Time Period  07/13/19-08/11/19    Authorization - Visit Number  3    Authorization - Number of Visits  8    PT Start Time  [id number]   Patient arrived late   PT Stop Time  1430    PT Time Calculation (min)  35 min    Activity Tolerance  Patient tolerated treatment well    Behavior During Therapy  [facility] for tasks assessed/performed       Past Medical History:  Diagnosis Date  . Arthritis    osteoarthritis knees hips  . GERD (gastroesophageal reflux disease)   . Hyperlipidemia   . Hypertension   . Stroke [facility])     Past Surgical History:  Procedure Laterality Date  . JOINT REPLACEMENT     right knee  . KNEE SURGERY Right 74  . TONSILLECTOMY     age 74  . TONSILLECTOMY    . TOTAL HIP ARTHROPLASTY Left 12/09/2016   Procedure: LEFT TOTAL HIP ARTHROPLASTY ANTERIOR APPROACH;  Surgeon: Gaynelle Arabian, MD;  Location: [facility];  Service: Orthopedics;  Laterality: Left;    There were no vitals filed for this visit.  Subjective Assessment - 07/25/19 1403    Subjective  Patient says he is sore today from activity, and lifting his wife. Patient reports 5/10 pain in hamstrings this afternoon. Says he has been doing his exercises at home and feels he is able to push off of his toe some.    Pertinent History  RT TKA, LT THA    Limitations  Lifting;Standing;Walking;House hold activities    How long can you stand comfortably?  10 min    How long can you walk comfortably?  10 min    Patient Stated  Goals  to be able to stand and walk without pain    Currently in Pain?  Yes    Pain Score  5     Pain Location  Leg    Pain Orientation  Right;Left;Posterior    Pain Descriptors / Indicators  Tightness;Aching    Pain Type  Acute pain    Pain Onset  Today    Pain Frequency  Intermittent                       [facility] Adult PT Treatment/Exercise - 07/25/19 0001      Knee/Hip Exercises: Stretches   Passive Hamstring Stretch  Both;5 reps;10 seconds    Passive Hamstring Stretch Limitations  on 12 inch box     Knee: Self-Stretch to increase Flexion  Both;5 reps;10 seconds    Knee: Self-Stretch Limitations  on 12 in step    Gastroc Stretch  Both;3 reps;30 seconds    Gastroc Stretch Limitations  on 2nd step       Knee/Hip Exercises: Standing   Heel Raises  Both;2 sets;10 reps    Forward Step Up  Both;10 reps;Step Height: 6";Hand Hold: 1    Step  Down  Both;10 reps;Step Height: 6";Hand Hold: 1    Other Standing Knee Exercises  tandem stance, 3 x 20" on foam     Other Standing Knee Exercises  tandem gait 2RT; heel/ toe walking 2RT                PT Short Term Goals - 07/13/19 1632      PT SHORT TERM GOAL #1   Title  Patient will be independent with initial HEP to improve functional outcomes    Time  2    Period  Weeks    Status  New    Target Date  07/28/19        PT Long Term Goals - 07/13/19 1633      PT LONG TERM GOAL #1   Title  Patient will report at least 75% overall improvement in subjective complaint to indicate improvement in ability to perform ADLs.    Time  4    Period  Weeks    Status  New    Target Date  08/11/19      PT LONG TERM GOAL #2   Title  Patient will improve BERG score to at least 55/56 to demo improvemetn in balance and reduced risk for falls.    Time  4    Period  Weeks    Status  New    Target Date  08/11/19      PT LONG TERM GOAL #3   Title  Patient will be able to ambulate at least 400 feet during 2MWT with LRAD to  demonstrate improved ability to perform functional mobility and associated tasks.    Time  4    Period  Weeks    Status  New    Target Date  08/11/19      PT LONG TERM GOAL #4   Title  Patient will have improved RT toe MMT to at least 4/5 flexion for improved ability to perform functional mobility and associated tasks.    Time  4    Period  Weeks    Status  New    Target Date  08/11/19            Plan - 07/25/19 1432    Clinical Impression Statement  Patient continues to note RT foot weakness, most notable with eccentric lowering on heel raises on RLE. Patient was well challenged with added tandem gait, heel/toe walking for balance and ankle stability. Patient had weakness and difficulty holding LT foot in DF with heel walking and keeping RT ankle PF in toe walking. Patient noted BLE muscle fatigue after forward step ups in 6 inch box. Patient reports no increased pain, and stiffness/discomfort in back of legs decreased from 5/10 to 3/10.    Examination-Activity Limitations  Locomotion Level;Transfers;Dressing;Stand;Stairs;Bend;Squat    Examination-Participation Restrictions  Community Activity;Yard Work    Stability/Clinical Decision Making  Stable/Uncomplicated    Rehab Potential  Good    PT Frequency  2x / week    PT Duration  4 weeks    PT Treatment/Interventions  ADLs/Self Care Home Management;Therapeutic exercise;Balance training;Manual techniques;Taping;Therapeutic activities;Functional mobility training;Stair training;Gait training;Patient/family education;Energy conservation;Joint Manipulations;Passive range of motion;Neuromuscular re-education    PT Next Visit Plan  Progress BLE strength and balance activity as tolerated. Add sidestepping next visit.    PT Home Exercise Plan  07/13/19: calf steretch at wall, great toe plantarflexion with band    Consulted and Agree with Plan of Care  Patient  Patient will benefit from skilled therapeutic intervention in order to  improve the following deficits and impairments:  Abnormal gait, Decreased endurance, Hypomobility, Decreased activity tolerance, Decreased strength, Decreased balance, Difficulty walking, Decreased mobility, Impaired flexibility, Decreased range of motion, Improper body mechanics  Visit Diagnosis: Other abnormalities of gait and mobility  Muscle weakness (generalized)     Problem List Patient Active Problem List   Diagnosis Date Noted  . OA (osteoarthritis) of hip 12/09/2016  . HLD (hyperlipidemia) 09/17/2016  . Essential hypertension 09/17/2016  . Stroke ([place]) 09/17/2016  . Chronic GERD 09/17/2016  . Osteoarthritis 09/17/2016  . Knee joint replacement status, right 09/17/2016  . Colon polyp 09/17/2016   2:34 PM, 07/25/19 Josue Hector PT DPT  Physical Therapist with [facility]  [phone]   [facility] [address] [place], [place], [address] Phone: [phone]   Fax:  [phone]  Name: Clynton Shanholtz MRN: [id number] Date of Birth: 1945-10-08

## 2019-07-27 ENCOUNTER — Ambulatory Visit (HOSPITAL_COMMUNITY): Payer: Medicare HMO | Admitting: Physical Therapy

## 2019-07-27 ENCOUNTER — Telehealth (HOSPITAL_COMMUNITY): Payer: Self-pay | Admitting: Physical Therapy

## 2019-07-27 NOTE — Telephone Encounter (Signed)
pt called to cancel this appt due to he has to be home with his wife who is expecting another call from a doctor's office.

## 2019-08-01 ENCOUNTER — Ambulatory Visit (HOSPITAL_COMMUNITY): Payer: Medicare HMO | Attending: Internal Medicine | Admitting: Physical Therapy

## 2019-08-01 ENCOUNTER — Other Ambulatory Visit: Payer: Self-pay

## 2019-08-01 ENCOUNTER — Encounter (HOSPITAL_COMMUNITY): Payer: Self-pay | Admitting: Physical Therapy

## 2019-08-01 DIAGNOSIS — M6281 Muscle weakness (generalized): Secondary | ICD-10-CM | POA: Diagnosis not present

## 2019-08-01 DIAGNOSIS — R2689 Other abnormalities of gait and mobility: Secondary | ICD-10-CM | POA: Diagnosis not present

## 2019-08-01 NOTE — Therapy (Signed)
[place], [place], [address] Phone: [phone]   Fax:  [phone]  Physical Therapy Treatment  Patient Details  Name: Jeremiah Kelley MRN: [id number] Date of Birth: 06-03-1946 Referring Provider (PT): Allyn Kenner MD   Encounter Date: 08/01/2019  PT End of Session - 08/01/19 1306    Visit Number  5    Number of Visits  8    Date for PT Re-Evaluation  08/11/19    Authorization Type  [employer] MCR (8 visits approved))    Authorization Time Period  07/13/19-08/11/19    Authorization - Visit Number  4    Authorization - Number of Visits  8    PT Start Time  [id number]    PT Stop Time  1345    PT Time Calculation (min)  42 min    Equipment Utilized During Treatment  Gait belt    Activity Tolerance  Patient tolerated treatment well    Behavior During Therapy  [facility] for tasks assessed/performed       Past Medical History:  Diagnosis Date  . Arthritis    osteoarthritis knees hips  . GERD (gastroesophageal reflux disease)   . Hyperlipidemia   . Hypertension   . Stroke [facility])     Past Surgical History:  Procedure Laterality Date  . JOINT REPLACEMENT     right knee  . KNEE SURGERY Right 2017  . TONSILLECTOMY     age 32  . TONSILLECTOMY    . TOTAL HIP ARTHROPLASTY Left 12/09/2016   Procedure: LEFT TOTAL HIP ARTHROPLASTY ANTERIOR APPROACH;  Surgeon: Gaynelle Arabian, MD;  Location: [facility];  Service: Orthopedics;  Laterality: Left;    There were no vitals filed for this visit.  Subjective Assessment - 08/01/19 1306    Subjective  Patient says foot "ain't bad, a little tight". Patient says he was able to put up curtains this weekend and had to climb a small ladder. Says he did fine with this. Patient says he was sore last week but is feeling better today. Reports no pain currently.    Pertinent History  RT TKA, LT THA    Limitations  Lifting;Standing;Walking;House hold activities    How long can you stand comfortably?  10 min     How long can you walk comfortably?  10 min    Patient Stated Goals  to be able to stand and walk without pain    Currently in Pain?  No/denies    Pain Onset  Today                       [facility] Adult PT Treatment/Exercise - 08/01/19 0001      Knee/Hip Exercises: Stretches   Passive Hamstring Stretch  Both;5 reps;10 seconds    Passive Hamstring Stretch Limitations  on 12 inch box     Knee: Self-Stretch to increase Flexion  Both;5 reps;10 seconds    Knee: Self-Stretch Limitations  on 12 in step    Gastroc Stretch  Both;3 reps;30 seconds    Gastroc Stretch Limitations  slant board      Knee/Hip Exercises: Cytogeneticist walkouts 50# x10      Knee/Hip Exercises: Standing   Stairs  3 RT, 7 inch step, single hand rail, reciprocal gait, cues for eccentric lowering on RT ankle     Other Standing Knee Exercises  SLS on foam 3 x 10"; sidestepping on balance beam 1RT;  1 RT on solid floor    Other Standing Knee Exercises  tandem gait 2RT; heel/ toe walking 2RT       Knee/Hip Exercises: Seated   Sit to Sand  2 sets;10 reps;without UE support               PT Short Term Goals - 07/13/19 1632      PT SHORT TERM GOAL #1   Title  Patient will be independent with initial HEP to improve functional outcomes    Time  2    Period  Weeks    Status  New    Target Date  07/28/19        PT Long Term Goals - 07/13/19 1633      PT LONG TERM GOAL #1   Title  Patient will report at least 75% overall improvement in subjective complaint to indicate improvement in ability to perform ADLs.    Time  4    Period  Weeks    Status  New    Target Date  08/11/19      PT LONG TERM GOAL #2   Title  Patient will improve BERG score to at least 55/56 to demo improvemetn in balance and reduced risk for falls.    Time  4    Period  Weeks    Status  New    Target Date  08/11/19      PT LONG TERM GOAL #3   Title  Patient will be able to ambulate  at least 400 feet during 2MWT with LRAD to demonstrate improved ability to perform functional mobility and associated tasks.    Time  4    Period  Weeks    Status  New    Target Date  08/11/19      PT LONG TERM GOAL #4   Title  Patient will have improved RT toe MMT to at least 4/5 flexion for improved ability to perform functional mobility and associated tasks.    Time  4    Period  Weeks    Status  New    Target Date  08/11/19            Plan - 08/01/19 1351    Clinical Impression Statement  Patient tolerated session well today, no increased complaint of pain but did note mild fatigue post session. Patient showed some improvement with LE control during heel walking but continues to have difficulty maintaining RT heel elevated during toe walking. Patient was well challenged with added sidestepping on balance beam and demos posterior lean, likely due to decreased RT ankle balance strategy. Patient required therapist assist for LOB x 2 on balance beam but tolerated all other balance activity well. Was able to progress to SLS on foam with intermittent HHA, but able to maintain 10 sec max bilaterally.    Examination-Activity Limitations  Locomotion Level;Transfers;Dressing;Stand;Stairs;Bend;Squat    Examination-Participation Restrictions  Community Activity;Yard Work    Stability/Clinical Decision Making  Stable/Uncomplicated    Rehab Potential  Good    PT Frequency  2x / week    PT Duration  4 weeks    PT Treatment/Interventions  ADLs/Self Care Home Management;Therapeutic exercise;Balance training;Manual techniques;Taping;Therapeutic activities;Functional mobility training;Stair training;Gait training;Patient/family education;Energy conservation;Joint Manipulations;Passive range of motion;Neuromuscular re-education    PT Next Visit Plan  Progress BLE strength and balance activity as tolerated. Contnue to work on balance beam sidestepping.    PT Home Exercise Plan  07/13/19: calf steretch at  wall, great toe  plantarflexion with band    Consulted and Agree with Plan of Care  Patient       Patient will benefit from skilled therapeutic intervention in order to improve the following deficits and impairments:  Abnormal gait, Decreased endurance, Hypomobility, Decreased activity tolerance, Decreased strength, Decreased balance, Difficulty walking, Decreased mobility, Impaired flexibility, Decreased range of motion, Improper body mechanics  Visit Diagnosis: Other abnormalities of gait and mobility  Muscle weakness (generalized)     Problem List Patient Active Problem List   Diagnosis Date Noted  . OA (osteoarthritis) of hip 12/09/2016  . HLD (hyperlipidemia) 09/17/2016  . Essential hypertension 09/17/2016  . Stroke ([place]) 09/17/2016  . Chronic GERD 09/17/2016  . Osteoarthritis 09/17/2016  . Knee joint replacement status, right 09/17/2016  . Colon polyp 09/17/2016   1:54 PM, 08/01/19 Josue Hector PT DPT  Physical Therapist with [facility]  [phone]   [facility] [address] [place], [place], [address] Phone: [phone]   Fax:  [phone]  Name: Dequarius Placzek MRN: [id number] Date of Birth: Feb 14, 1946

## 2019-08-03 ENCOUNTER — Ambulatory Visit (HOSPITAL_COMMUNITY): Payer: Medicare HMO | Admitting: Physical Therapy

## 2019-08-03 ENCOUNTER — Other Ambulatory Visit: Payer: Self-pay

## 2019-08-03 ENCOUNTER — Encounter (HOSPITAL_COMMUNITY): Payer: Self-pay | Admitting: Physical Therapy

## 2019-08-03 DIAGNOSIS — M6281 Muscle weakness (generalized): Secondary | ICD-10-CM | POA: Diagnosis not present

## 2019-08-03 DIAGNOSIS — R2689 Other abnormalities of gait and mobility: Secondary | ICD-10-CM | POA: Diagnosis not present

## 2019-08-03 NOTE — Therapy (Signed)
[place], [place], [address] Phone: [phone]   Fax:  [phone]  Physical Therapy Treatment  Patient Details  Name: Jeremiah Kelley MRN: [id number] Date of Birth: 08-07-1945 Referring Provider (PT): Allyn Kenner MD   Encounter Date: 08/03/2019  PT End of Session - 08/03/19 1314    Visit Number  6    Number of Visits  8    Date for PT Re-Evaluation  08/11/19    Authorization Type  [employer] MCR (8 visits approved))    Authorization Time Period  07/13/19-08/11/19    Authorization - Visit Number  5    Authorization - Number of Visits  8    PT Start Time  [id number]    PT Stop Time  1345    PT Time Calculation (min)  40 min    Equipment Utilized During Treatment  Gait belt    Activity Tolerance  Patient tolerated treatment well    Behavior During Therapy  [facility] for tasks assessed/performed       Past Medical History:  Diagnosis Date  . Arthritis    osteoarthritis knees hips  . GERD (gastroesophageal reflux disease)   . Hyperlipidemia   . Hypertension   . Stroke [facility])     Past Surgical History:  Procedure Laterality Date  . JOINT REPLACEMENT     right knee  . KNEE SURGERY Right 2017  . TONSILLECTOMY     age 74  . TONSILLECTOMY    . TOTAL HIP ARTHROPLASTY Left 12/09/2016   Procedure: LEFT TOTAL HIP ARTHROPLASTY ANTERIOR APPROACH;  Surgeon: Gaynelle Arabian, MD;  Location: [facility];  Service: Orthopedics;  Laterality: Left;    There were no vitals filed for this visit.  Subjective Assessment - 08/03/19 1313    Subjective  Patient says his back is sore today from helping transfer his wife yesterday. Says he had to sleep on heating pad last night. Says otherwise feels his legs are getting stonger, reports no pain currently.    Pertinent History  RT TKA, LT THA    Limitations  Lifting;Standing;Walking;House hold activities    How long can you stand comfortably?  10 min    How long can you walk comfortably?  10 min    Patient Stated Goals  to be able to stand and walk without pain    Currently in Pain?  No/denies    Pain Onset  Today                       [facility] Adult PT Treatment/Exercise - 08/03/19 0001      Knee/Hip Exercises: Stretches   Passive Hamstring Stretch  Both;5 reps;10 seconds    Passive Hamstring Stretch Limitations  on 12 inch box     Knee: Self-Stretch to increase Flexion  Both;5 reps;10 seconds    Knee: Self-Stretch Limitations  on 12 in step    Gastroc Stretch  Both;3 reps;30 seconds    Gastroc Stretch Limitations  slant board      Knee/Hip Exercises: Machines for Strengthening   Cybex Leg Press  50# 2 x10     Other Machine  Machine walkouts 50# x10; calf raises on leg press, 50# x20      Knee/Hip Exercises: Standing   Rocker Board  2 minutes   front/back; side/side   Other Standing Knee Exercises  box deadlift from floor, 10lb; sidestepping on balance beam 2 RT.     Other Standing Knee Exercises  tandem gait 2RT; heel/ toe walking 2RT       Knee/Hip Exercises: Seated   Sit to Sand  1 set;10 reps;without UE support               PT Short Term Goals - 07/13/19 1632      PT SHORT TERM GOAL #1   Title  Patient will be independent with initial HEP to improve functional outcomes    Time  2    Period  Weeks    Status  New    Target Date  07/28/19        PT Long Term Goals - 07/13/19 1633      PT LONG TERM GOAL #1   Title  Patient will report at least 75% overall improvement in subjective complaint to indicate improvement in ability to perform ADLs.    Time  4    Period  Weeks    Status  New    Target Date  08/11/19      PT LONG TERM GOAL #2   Title  Patient will improve BERG score to at least 55/56 to demo improvemetn in balance and reduced risk for falls.    Time  4    Period  Weeks    Status  New    Target Date  08/11/19      PT LONG TERM GOAL #3   Title  Patient will be able to ambulate at least 400 feet during 2MWT with LRAD to  demonstrate improved ability to perform functional mobility and associated tasks.    Time  4    Period  Weeks    Status  New    Target Date  08/11/19      PT LONG TERM GOAL #4   Title  Patient will have improved RT toe MMT to at least 4/5 flexion for improved ability to perform functional mobility and associated tasks.    Time  4    Period  Weeks    Status  New    Target Date  08/11/19            Plan - 08/03/19 1639    Clinical Impression Statement  Patient able to progress BLE strengthening activity without complaint. Added machine leg press and box deadlifts from floor. Patient with good return for deadlift, min cueing for maintaining straight back through movement and lifting with legs. Patient required verbal cues for avoiding excessive RLE ER with added machine leg press. Patient shows improvement with static balance on rocker board, but continues to be well challenged with sidestepping on balance beam. Patient did improve, but continues to require verbal and tactile cues for maintain weight forward over BOS to avoid falling backward off of beam during sidesteps. Patient demos LOB x 1 which was corrected with therapist assist. Patient with overall good tolerance to todays activity, no increased complaint of pain.    Examination-Activity Limitations  Locomotion Level;Transfers;Dressing;Stand;Stairs;Bend;Squat    Examination-Participation Restrictions  Community Activity;Yard Work    Stability/Clinical Decision Making  Stable/Uncomplicated    Rehab Potential  Good    PT Frequency  2x / week    PT Duration  4 weeks    PT Treatment/Interventions  ADLs/Self Care Home Management;Therapeutic exercise;Balance training;Manual techniques;Taping;Therapeutic activities;Functional mobility training;Stair training;Gait training;Patient/family education;Energy conservation;Joint Manipulations;Passive range of motion;Neuromuscular re-education    PT Next Visit Plan  Progress BLE strength and  balance activity as tolerated. Contnue to work on balance beam sidestepping.    PT Home Exercise  Plan  07/13/19: calf steretch at wall, great toe plantarflexion with band    Consulted and Agree with Plan of Care  Patient       Patient will benefit from skilled therapeutic intervention in order to improve the following deficits and impairments:  Abnormal gait, Decreased endurance, Hypomobility, Decreased activity tolerance, Decreased strength, Decreased balance, Difficulty walking, Decreased mobility, Impaired flexibility, Decreased range of motion, Improper body mechanics  Visit Diagnosis: Other abnormalities of gait and mobility  Muscle weakness (generalized)     Problem List Patient Active Problem List   Diagnosis Date Noted  . OA (osteoarthritis) of hip 12/09/2016  . HLD (hyperlipidemia) 09/17/2016  . Essential hypertension 09/17/2016  . Stroke ([place]) 09/17/2016  . Chronic GERD 09/17/2016  . Osteoarthritis 09/17/2016  . Knee joint replacement status, right 09/17/2016  . Colon polyp 09/17/2016   4:41 PM, 08/03/19 Josue Hector PT DPT  Physical Therapist with [facility]  [phone]   [facility] [address] [place], [place], [address] Phone: [phone]   Fax:  [phone]  Name: Ella Easterly MRN: [id number] Date of Birth: [birth year]

## 2019-08-08 ENCOUNTER — Ambulatory Visit (HOSPITAL_COMMUNITY): Payer: Medicare HMO | Admitting: Physical Therapy

## 2019-08-08 ENCOUNTER — Other Ambulatory Visit: Payer: Self-pay

## 2019-08-08 ENCOUNTER — Encounter (HOSPITAL_COMMUNITY): Payer: Self-pay | Admitting: Physical Therapy

## 2019-08-08 DIAGNOSIS — R2689 Other abnormalities of gait and mobility: Secondary | ICD-10-CM

## 2019-08-08 DIAGNOSIS — M6281 Muscle weakness (generalized): Secondary | ICD-10-CM

## 2019-08-08 NOTE — Therapy (Signed)
[place], [place], [address] Phone: [phone]   Fax:  [phone]  Physical Therapy Treatment  Patient Details  Name: Jeremiah Kelley MRN: [id number] Date of Birth: 1945-10-15 Referring Provider (PT): Allyn Kenner MD   Encounter Date: 08/08/2019  PT End of Session - 08/08/19 [id number]    Visit Number  7    Number of Visits  8    Date for PT Re-Evaluation  08/11/19    Authorization Type  [employer] MCR (8 visits approved))    Authorization Time Period  07/13/19-08/11/19    Authorization - Visit Number  6    Authorization - Number of Visits  8    PT Start Time  [id number]    PT Stop Time  1525    PT Time Calculation (min)  50 min    Equipment Utilized During Treatment  Gait belt    Activity Tolerance  Patient tolerated treatment well    Behavior During Therapy  [facility] for tasks assessed/performed       Past Medical History:  Diagnosis Date  . Arthritis    osteoarthritis knees hips  . GERD (gastroesophageal reflux disease)   . Hyperlipidemia   . Hypertension   . Stroke [facility])     Past Surgical History:  Procedure Laterality Date  . JOINT REPLACEMENT     right knee  . KNEE SURGERY Right 2017  . TONSILLECTOMY     age 38  . TONSILLECTOMY    . TOTAL HIP ARTHROPLASTY Left 12/09/2016   Procedure: LEFT TOTAL HIP ARTHROPLASTY ANTERIOR APPROACH;  Surgeon: Gaynelle Arabian, MD;  [facility];  Service: Orthopedics;  Laterality: Left;    There were no vitals filed for this visit.  Subjective Assessment - 08/08/19 1437    Subjective  Patient says his arm is sore form helping his wife with transfers. Says his back and legs are good. Mild soreness in RT hip. Not sore after last visit.    Pertinent History  RT TKA, LT THA    Limitations  Lifting;Standing;Walking;House hold activities    How long can you stand comfortably?  10 min    How long can you walk comfortably?  10 min    Patient Stated Goals  to be able to stand and walk  without pain    Currently in Pain?  Yes    Pain Score  3     Pain Location  Hip    Pain Orientation  Right;Posterior    Pain Onset  Today                       [facility] Adult PT Treatment/Exercise - 08/08/19 0001      Knee/Hip Exercises: Stretches   Knee: Self-Stretch to increase Flexion  Both;5 reps;10 seconds    Knee: Self-Stretch Limitations  on 12 in step    Gastroc Stretch  Both;3 reps;30 seconds    Gastroc Stretch Limitations  slant board      Knee/Hip Exercises: Machines for Strengthening   Cybex Leg Press  50# 2 x10     Other Machine  Machine walkouts 50# x10; calf raises on leg press, 50# x20      Knee/Hip Exercises: Standing   Stairs  4 RT, 7 inch step, single hand rail, reciprocal gait, cues for eccentric lowering on RT ankle     Rocker Board  2 minutes   front/ back; side/ side    SLS  3 x  10" each on foam     Other Standing Knee Exercises  tandem gait 2RT; heel/ toe walking 2RT       Knee/Hip Exercises: Seated   Sit to Sand  1 set;10 reps;without UE support               PT Short Term Goals - 07/13/19 1632      PT SHORT TERM GOAL #1   Title  Patient will be independent with initial HEP to improve functional outcomes    Time  2    Period  Weeks    Status  New    Target Date  07/28/19        PT Long Term Goals - 07/13/19 1633      PT LONG TERM GOAL #1   Title  Patient will report at least 75% overall improvement in subjective complaint to indicate improvement in ability to perform ADLs.    Time  4    Period  Weeks    Status  New    Target Date  08/11/19      PT LONG TERM GOAL #2   Title  Patient will improve BERG score to at least 55/56 to demo improvemetn in balance and reduced risk for falls.    Time  4    Period  Weeks    Status  New    Target Date  08/11/19      PT LONG TERM GOAL #3   Title  Patient will be able to ambulate at least 400 feet during 2MWT with LRAD to demonstrate improved ability to perform functional  mobility and associated tasks.    Time  4    Period  Weeks    Status  New    Target Date  08/11/19      PT LONG TERM GOAL #4   Title  Patient will have improved RT toe MMT to at least 4/5 flexion for improved ability to perform functional mobility and associated tasks.    Time  4    Period  Weeks    Status  New    Target Date  08/11/19            Plan - 08/08/19 1613    Clinical Impression Statement  Patient tolerated session well today with no increased complaint on pain. Patient required cueing for foot placement during leg press. Patient able to increase reps with stair amb with good return. Patient balance is improving, patient did well with SLS on foam today. Patient also improved with sidestepping on balance beam, but did require min A and tactile cues for keeping balance/ avoiding LOB. Patient with mild muscle fatigue post treatment.    Examination-Activity Limitations  Locomotion Level;Transfers;Dressing;Stand;Stairs;Bend;Squat    Examination-Participation Restrictions  Community Activity;Yard Work    Stability/Clinical Decision Making  Stable/Uncomplicated    Rehab Potential  Good    PT Frequency  2x / week    PT Duration  4 weeks    PT Treatment/Interventions  ADLs/Self Care Home Management;Therapeutic exercise;Balance training;Manual techniques;Taping;Therapeutic activities;Functional mobility training;Stair training;Gait training;Patient/family education;Energy conservation;Joint Manipulations;Passive range of motion;Neuromuscular re-education    PT Next Visit Plan  Reassess next visit.    PT Home Exercise Plan  07/13/19: calf steretch at wall, great toe plantarflexion with band    Consulted and Agree with Plan of Care  Patient       Patient will benefit from skilled therapeutic intervention in order to improve the following deficits and impairments:  Abnormal gait,  Decreased endurance, Hypomobility, Decreased activity tolerance, Decreased strength, Decreased balance,  Difficulty walking, Decreased mobility, Impaired flexibility, Decreased range of motion, Improper body mechanics  Visit Diagnosis: Other abnormalities of gait and mobility  Muscle weakness (generalized)     Problem List Patient Active Problem List   Diagnosis Date Noted  . OA (osteoarthritis) of hip 12/09/2016  . HLD (hyperlipidemia) 09/17/2016  . Essential hypertension 09/17/2016  . Stroke ([place]) 09/17/2016  . Chronic GERD 09/17/2016  . Osteoarthritis 09/17/2016  . Knee joint replacement status, right 09/17/2016  . Colon polyp 09/17/2016   4:17 PM, 08/08/19 Josue Hector PT DPT  Physical Therapist with [facility]  [phone]   [facility] [address] [place], [place], [address] Phone: [phone]   Fax:  [phone]  Name: Jeremiah Kelley MRN: [id number] Date of Birth: Jan 14, 1946

## 2019-08-10 ENCOUNTER — Ambulatory Visit (HOSPITAL_COMMUNITY): Payer: Medicare HMO | Admitting: Physical Therapy

## 2019-08-10 ENCOUNTER — Encounter (HOSPITAL_COMMUNITY): Payer: Self-pay | Admitting: Physical Therapy

## 2019-08-10 ENCOUNTER — Other Ambulatory Visit: Payer: Self-pay

## 2019-08-10 DIAGNOSIS — R2689 Other abnormalities of gait and mobility: Secondary | ICD-10-CM

## 2019-08-10 DIAGNOSIS — M6281 Muscle weakness (generalized): Secondary | ICD-10-CM | POA: Diagnosis not present

## 2019-08-10 NOTE — Therapy (Signed)
[place], [place], [address] Phone: [phone]   Fax:  [phone]  Physical Therapy Treatment/ Progress Note  Patient Details  Name: Jeremiah Kelley MRN: [id number] Date of Birth: 07-30-1945 Referring Provider (PT): Allyn Kenner MD   Encounter Date: 08/10/2019   Progress Note Reporting Period 07/13/19 to 08/10/19  See note below for Objective Data and Assessment of Progress/Goals.        PT End of Session - 08/10/19 1522    Visit Number  8    Number of Visits  16    Date for PT Re-Evaluation  08/11/19    Authorization Type  [employer] MCR (submitted for 8 visits on 08/10/19)    Authorization Time Period  07/13/19-08/11/19; 08/10/19-09/08/19    Authorization - Visit Number  7    Authorization - Number of Visits  8    PT Start Time  1520    PT Stop Time  1610    PT Time Calculation (min)  50 min    Equipment Utilized During Treatment  Gait belt    Activity Tolerance  Patient tolerated treatment well    Behavior During Therapy  [facility] for tasks assessed/performed       Past Medical History:  Diagnosis Date  . Arthritis    osteoarthritis knees hips  . GERD (gastroesophageal reflux disease)   . Hyperlipidemia   . Hypertension   . Stroke [facility])     Past Surgical History:  Procedure Laterality Date  . JOINT REPLACEMENT     right knee  . KNEE SURGERY Right 2017  . TONSILLECTOMY     age 53  . TONSILLECTOMY    . TOTAL HIP ARTHROPLASTY Left 12/09/2016   Procedure: LEFT TOTAL HIP ARTHROPLASTY ANTERIOR APPROACH;  Surgeon: Gaynelle Arabian, MD;  [facility]: [facility];  Service: Orthopedics;  Laterality: Left;    There were no vitals filed for this visit.  Subjective Assessment - 08/10/19 1528    Subjective  Patient says he feels better and can see improvement with therapy. Patient says he still feels somewhat unsteady on complaint surfaces like carpeted areas in home, and with making lateral steps. Patient says he feels  about 35% improvement since starting therapy. Reports no pain currently.    Pertinent History  RT TKA, LT THA    Limitations  Lifting;Standing;Walking;House hold activities    How long can you stand comfortably?  10 min    How long can you walk comfortably?  10 min    Patient Stated Goals  to be able to stand and walk without pain    Currently in Pain?  No/denies    Pain Onset  Today         [facility] PT Assessment - 08/10/19 0001      Assessment   Medical Diagnosis  balance and RT toe stiffness     Referring Provider (PT)  Allyn Kenner MD    Prior Therapy  No       Precautions   Precautions  None      Restrictions   Weight Bearing Restrictions  No      Balance Screen   Has the patient fallen in the past 6 months  No    Has the patient had a decrease in activity level because of a fear of falling?   No    Is the patient reluctant to leave their home because of a fear of falling?   No  Prior Function   Level of Independence  Independent      Cognition   Overall Cognitive Status  Within Functional Limits for tasks assessed      Sensation   Light Touch  Appears Intact      AROM   Right Knee Extension  0    Right Knee Flexion  103    Left Knee Extension  10    Left Knee Flexion  100    Right Ankle Dorsiflexion  10    Right Ankle Plantar Flexion  40    Left Ankle Dorsiflexion  12    Left Ankle Plantar Flexion  40      Strength   Right Hip Flexion  5/5    Right Hip Extension  5/5    Right Hip ABduction  5/5    Left Hip Flexion  5/5    Left Hip Extension  4+/5    Left Hip ABduction  5/5    Right Knee Flexion  5/5    Right Knee Extension  5/5    Left Knee Flexion  5/5    Left Knee Extension  5/5    Right/Left Ankle  --   RT great toe flexion: 3+/5   Right Ankle Dorsiflexion  5/5    Left Ankle Dorsiflexion  5/5      Ambulation/Gait   Ambulation/Gait  Yes    Ambulation/Gait Assistance  6: Modified independent (Device/Increase time)    Ambulation Distance (Feet)   294 Feet    Assistive device  None    Gait Pattern  Decreased stride length;Decreased dorsiflexion - right;Decreased step length - right    Ambulation Surface  Level;Indoor    Gait Comments  2MWT       Berg Balance Test   Sit to Stand  Able to stand without using hands and stabilize independently    Standing Unsupported  Able to stand safely 2 minutes    Sitting with Back Unsupported but Feet Supported on Floor or Stool  Able to sit safely and securely 2 minutes    Stand to Sit  Sits safely with minimal use of hands    Transfers  Able to transfer safely, minor use of hands    Standing Unsupported with Eyes Closed  Able to stand 10 seconds safely    Standing Unsupported with Feet Together  Able to place feet together independently and stand 1 minute safely    From Standing, Reach Forward with Outstretched Arm  Can reach forward >12 cm safely (5")    From Standing Position, Pick up Object from Floor  Able to pick up shoe safely and easily    From Standing Position, Turn to Look Behind Over each Shoulder  Looks behind one side only/other side shows less weight shift    Turn 360 Degrees  Able to turn 360 degrees safely in 4 seconds or less    Standing Unsupported, Alternately Place Feet on Step/Stool  Able to stand independently and safely and complete 8 steps in 20 seconds    Standing Unsupported, One Foot in Front  Able to plae foot ahead of the other independently and hold 30 seconds    Standing on One Leg  Able to lift leg independently and hold 5-10 seconds    Total Score  52                   [facility] Adult PT Treatment/Exercise - 08/10/19 0001      Knee/Hip Exercises: Standing  Other Standing Knee Exercises  Sidestepping on balance beam 2 RT.                PT Short Term Goals - 08/10/19 1621      PT SHORT TERM GOAL #1   Title  Patient will be independent with initial HEP to improve functional outcomes    Time  2    Period  Weeks    Status  Achieved     Target Date  07/28/19        PT Long Term Goals - 08/10/19 1621      PT LONG TERM GOAL #1   Title  Patient will report at least 75% overall improvement in subjective complaint to indicate improvement in ability to perform ADLs.    Baseline  Current: 35%    Time  4    Period  Weeks    Status  On-going      PT LONG TERM GOAL #2   Title  Patient will improve BERG score to at least 55/56 to demo improvemetn in balance and reduced risk for falls.    Baseline  Current: 53    Time  4    Period  Weeks    Status  On-going      PT LONG TERM GOAL #3   Title  Patient will be able to ambulate at least 400 feet during 2MWT with LRAD to demonstrate improved ability to perform functional mobility and associated tasks.    Baseline  current: 294 feet with no AD    Time  4    Period  Weeks    Status  On-going      PT LONG TERM GOAL #4   Title  Patient will have improved RT toe MMT to at least 4/5 flexion for improved ability to perform functional mobility and associated tasks.    Baseline  Current: 3+/5    Time  4    Period  Weeks    Status  On-going            Plan - 08/10/19 1623    Clinical Impression Statement  Patient is making good progress to therapy goals. Patient has shown improvement in strength and static balance, as well as overall mobility. Patient continues to be limited by dynamic balance deficits, decreased endurance with functional activity, and ongoing weakness in RT toe which is negatively impacting ambulation and functional mobility. Patient will continue to benefit from skilled therapy services to address remaining deficits to reduce pain and improve level of function with ADLs, functional mobility tasks and reduce risk for future falls.    Examination-Activity Limitations  Locomotion Level;Transfers;Dressing;Stand;Stairs;Bend;Squat    Examination-Participation Restrictions  Community Activity;Yard Work    Stability/Clinical Decision Making  Stable/Uncomplicated     Rehab Potential  Good    PT Frequency  2x / week    PT Duration  4 weeks    PT Treatment/Interventions  ADLs/Self Care Home Management;Therapeutic exercise;Balance training;Manual techniques;Taping;Therapeutic activities;Functional mobility training;Stair training;Gait training;Patient/family education;Energy conservation;Joint Manipulations;Passive range of motion;Neuromuscular re-education    PT Next Visit Plan  Continue to progress with focus on endurance, gait, dynamic balance and functional strengthening    PT Home Exercise Plan  07/13/19: calf steretch at wall, great toe plantarflexion with band    Consulted and Agree with Plan of Care  Patient       Patient will benefit from skilled therapeutic intervention in order to improve the following deficits and impairments:  Abnormal gait, Decreased endurance,  Hypomobility, Decreased activity tolerance, Decreased strength, Decreased balance, Difficulty walking, Decreased mobility, Impaired flexibility, Decreased range of motion, Improper body mechanics  Visit Diagnosis: Other abnormalities of gait and mobility  Muscle weakness (generalized)     Problem List Patient Active Problem List   Diagnosis Date Noted  . OA (osteoarthritis) of hip 12/09/2016  . HLD (hyperlipidemia) 09/17/2016  . Essential hypertension 09/17/2016  . Stroke ([place]) 09/17/2016  . Chronic GERD 09/17/2016  . Osteoarthritis 09/17/2016  . Knee joint replacement status, right 09/17/2016  . Colon polyp 09/17/2016   4:31 PM, 08/10/19 Josue Hector PT DPT  Physical Therapist with [facility]  [phone]   [facility] [address] [place], [place], [address] Phone: [phone]   Fax:  [phone]  Name: Jeremiah Kelley MRN: [id number] Date of Birth: 1946-06-28

## 2019-08-10 NOTE — Addendum Note (Signed)
Addended by: Josue Hector A on: 08/10/2019 04:36 PM   Modules accepted: Orders

## 2019-08-14 ENCOUNTER — Telehealth (HOSPITAL_COMMUNITY): Payer: Self-pay | Admitting: Physical Therapy

## 2019-08-14 ENCOUNTER — Ambulatory Visit (HOSPITAL_COMMUNITY): Payer: Medicare HMO | Admitting: Physical Therapy

## 2019-08-14 ENCOUNTER — Encounter (HOSPITAL_COMMUNITY): Payer: Self-pay

## 2019-08-14 NOTE — Telephone Encounter (Signed)
pt lm that he was cancelling his appt for today, no reason given

## 2019-08-16 ENCOUNTER — Encounter (INDEPENDENT_AMBULATORY_CARE_PROVIDER_SITE_OTHER): Payer: Self-pay

## 2019-08-16 ENCOUNTER — Ambulatory Visit (HOSPITAL_COMMUNITY): Payer: Medicare HMO | Admitting: Physical Therapy

## 2019-08-16 ENCOUNTER — Encounter (HOSPITAL_COMMUNITY): Payer: Self-pay | Admitting: Physical Therapy

## 2019-08-16 ENCOUNTER — Other Ambulatory Visit: Payer: Self-pay

## 2019-08-16 DIAGNOSIS — R2689 Other abnormalities of gait and mobility: Secondary | ICD-10-CM | POA: Diagnosis not present

## 2019-08-16 DIAGNOSIS — M6281 Muscle weakness (generalized): Secondary | ICD-10-CM

## 2019-08-16 NOTE — Therapy (Signed)
[place], [place], [address] Phone: [phone]   Fax:  [phone]  Physical Therapy Treatment  Patient Details  Name: Jeremiah Kelley MRN: [id number] Date of Birth: Oct 19, 1945 Referring Provider (PT): Allyn Kenner MD   Encounter Date: 08/16/2019  PT End of Session - 08/16/19 1320    Visit Number  9    Number of Visits  16    Date for PT Re-Evaluation  08/11/19    Authorization Type  [employer] M[employer] (submitted for 8 visits on 08/10/19), APPROVED 8 visists from 2-11 to 3-12    Authorization Time Period  07/13/19-08/11/19; 08/10/19-09/08/19    Authorization - Visit Number  2    Authorization - Number of Visits  8    PT Start Time  1320    PT Stop Time  1358    PT Time Calculation (min)  38 min    Equipment Utilized During Treatment  Gait belt    Activity Tolerance  Patient tolerated treatment well    Behavior During Therapy  [facility] for tasks assessed/performed       Past Medical History:  Diagnosis Date  . Arthritis    osteoarthritis knees hips  . GERD (gastroesophageal reflux disease)   . Hyperlipidemia   . Hypertension   . Stroke [facility])     Past Surgical History:  Procedure Laterality Date  . JOINT REPLACEMENT     right knee  . KNEE SURGERY Right 2017  . TONSILLECTOMY     age 71  . TONSILLECTOMY    . TOTAL HIP ARTHROPLASTY Left 12/09/2016   Procedure: LEFT TOTAL HIP ARTHROPLASTY ANTERIOR APPROACH;  Surgeon: Gaynelle Arabian, MD;  Location: [facility];  Service: Orthopedics;  Laterality: Left;    There were no vitals filed for this visit.  Subjective Assessment - 08/16/19 1323    Subjective  States he is "doing". States he is in his normal pain level due to arthritis- in his whole body, some days are worse then others.    Pertinent History  RT TKA, LT THA    Limitations  Lifting;Standing;Walking;House hold activities    How long can you stand comfortably?  10 min    How long can you walk comfortably?  10 min     Patient Stated Goals  to be able to stand and walk without pain    Currently in Pain?  Yes    Pain Score  5     Pain Onset  Today         [facility] PT Assessment - 08/16/19 0001      Assessment   Medical Diagnosis  balance and RT toe stiffness     Referring Provider (PT)  Allyn Kenner MD      Observation/Other Assessments-Edema    Edema  Circumferential      Circumferential Edema   Circumferential - Right  At malleoli 9.75 inches, biggest part of calf 17.75 inches    Circumferential - Left   At malleoli 7.5 inches, biggest part of calf 15.75  inches                   [facility] Adult PT Treatment/Exercise - 08/16/19 0001      Knee/Hip Exercises: Standing   Other Standing Knee Exercises  SLS with vector slide in 3 planes 2x5 B    Other Standing Knee Exercises  seated chair pose hold 3x30" no UE       Knee/Hip Exercises: Seated   Other  Seated Knee/Hip Exercises  arch curl ino foam - PT assist 2x8, 5" holds, 2x5 ind. 5" holds      Manual Therapy   Manual Therapy  Joint mobilization    Manual therapy comments  all manual interventions performed indepedently of other interventions    Joint Mobilization  right great toe mobilization into flexion grade III - flelt good              PT Education - 08/16/19 1710    Education Details  on use of compression stockings, elevation, compression devices and current swelling measurements and presenation. How that could be decreasing neural input to flexor hallucis    Person(s) Educated  Patient    Methods  Explanation    Comprehension  Verbalized understanding       PT Short Term Goals - 08/10/19 1621      PT SHORT TERM GOAL #1   Title  Patient will be independent with initial HEP to improve functional outcomes    Time  2    Period  Weeks    Status  Achieved    Target Date  07/28/19        PT Long Term Goals - 08/10/19 1621      PT LONG TERM GOAL #1   Title  Patient will report at least 75% overall improvement in  subjective complaint to indicate improvement in ability to perform ADLs.    Baseline  Current: 35%    Time  4    Period  Weeks    Status  On-going      PT LONG TERM GOAL #2   Title  Patient will improve BERG score to at least 55/56 to demo improvemetn in balance and reduced risk for falls.    Baseline  Current: 53    Time  4    Period  Weeks    Status  On-going      PT LONG TERM GOAL #3   Title  Patient will be able to ambulate at least 400 feet during 2MWT with LRAD to demonstrate improved ability to perform functional mobility and associated tasks.    Baseline  current: 294 feet with no AD    Time  4    Period  Weeks    Status  On-going      PT LONG TERM GOAL #4   Title  Patient will have improved RT toe MMT to at least 4/5 flexion for improved ability to perform functional mobility and associated tasks.    Baseline  Current: 3+/5    Time  4    Period  Weeks    Status  On-going            Plan - 08/16/19 1713    Clinical Impression Statement  Focused on patient education today about swelling and how that can be contributing to decreased muscle activation of right great toe. Educated patient on use of compression garments and devices to help with swelling. Added seated arch exercise to help with muscle activation. Will follow up with swelling next session.    Examination-Activity Limitations  Locomotion Level;Transfers;Dressing;Stand;Stairs;Bend;Squat    Examination-Participation Restrictions  Community Activity;Yard Work    Stability/Clinical Decision Making  Stable/Uncomplicated    Rehab Potential  Good    PT Frequency  2x / week    PT Duration  4 weeks    PT Treatment/Interventions  ADLs/Self Care Home Management;Therapeutic exercise;Balance training;Manual techniques;Taping;Therapeutic activities;Functional mobility training;Stair training;Gait training;Patient/family education;Energy conservation;Joint Manipulations;Passive range of motion;Neuromuscular  re-education     PT Next Visit Plan  f/u with swelling, use of compression socks    PT Home Exercise Plan  07/13/19: calf steretch at wall, great toe plantarflexion with band; 2/17 arch curls seated, compression socks    Consulted and Agree with Plan of Care  Patient       Patient will benefit from skilled therapeutic intervention in order to improve the following deficits and impairments:  Abnormal gait, Decreased endurance, Hypomobility, Decreased activity tolerance, Decreased strength, Decreased balance, Difficulty walking, Decreased mobility, Impaired flexibility, Decreased range of motion, Improper body mechanics  Visit Diagnosis: Other abnormalities of gait and mobility  Muscle weakness (generalized)     Problem List Patient Active Problem List   Diagnosis Date Noted  . OA (osteoarthritis) of hip 12/09/2016  . HLD (hyperlipidemia) 09/17/2016  . Essential hypertension 09/17/2016  . Stroke ([place]) 09/17/2016  . Chronic GERD 09/17/2016  . Osteoarthritis 09/17/2016  . Knee joint replacement status, right 09/17/2016  . Colon polyp 09/17/2016   5:22 PM, 08/16/19 Jerene Pitch, DPT Physical Therapy with [facility]  [phone] office  [place] [address] [place], [place], [address] Phone: [phone]   Fax:  [phone]  Name: Jeremiah Kelley MRN: [id number] Date of Birth: [birth year]

## 2019-08-22 ENCOUNTER — Encounter (HOSPITAL_COMMUNITY): Payer: Self-pay | Admitting: Physical Therapy

## 2019-08-22 ENCOUNTER — Ambulatory Visit (HOSPITAL_COMMUNITY): Payer: Medicare HMO | Admitting: Physical Therapy

## 2019-08-22 ENCOUNTER — Other Ambulatory Visit: Payer: Self-pay

## 2019-08-22 DIAGNOSIS — R2689 Other abnormalities of gait and mobility: Secondary | ICD-10-CM | POA: Diagnosis not present

## 2019-08-22 DIAGNOSIS — M6281 Muscle weakness (generalized): Secondary | ICD-10-CM | POA: Diagnosis not present

## 2019-08-22 NOTE — Therapy (Signed)
[place] [address] [place], [place], [address] Phone: [phone]   Fax:  [phone]  Physical Therapy Treatment  Patient Details  Name: Jeremiah Kelley MRN: [id number] Date of Birth: 10-02-1945 Referring Provider (PT): Allyn Kenner MD   Encounter Date: 08/22/2019    Past Medical History:  Diagnosis Date  . Arthritis    osteoarthritis knees hips  . GERD (gastroesophageal reflux disease)   . Hyperlipidemia   . Hypertension   . Stroke [facility])     Past Surgical History:  Procedure Laterality Date  . JOINT REPLACEMENT     right knee  . KNEE SURGERY Right 2017  . TONSILLECTOMY     age 23  . TONSILLECTOMY    . TOTAL HIP ARTHROPLASTY Left 12/09/2016   Procedure: LEFT TOTAL HIP ARTHROPLASTY ANTERIOR APPROACH;  Surgeon: Gaynelle Arabian, MD;  Location[facility];  Service: Orthopedics;  Laterality: Left;    There were no vitals filed for this visit.  Subjective Assessment - 08/22/19 1407    Subjective  States that he has been wearing his compression socks and he feels his swelling is better. States he has not used the compression machine but will tonight. States he feels his toe is moving better and that he wants to retry the paper assessment (keeping a piece of paper from coming out from under his toe).    Pertinent History  RT TKA, LT THA    Limitations  Lifting;Standing;Walking;House hold activities    How long can you stand comfortably?  10 min    How long can you walk comfortably?  10 min    Patient Stated Goals  to be able to stand and walk without pain    Pain Onset  Today         [facility] PT Assessment - 08/22/19 0001      Assessment   Medical Diagnosis  balance and RT toe stiffness     Referring Provider (PT)  Allyn Kenner MD      Circumferential Edema   Circumferential - Right  calf 16 inches, at malleoli 11 inches    Circumferential - Left   calf 15.75 inches at malleoli 10.25 inches                   [facility]  Adult PT Treatment/Exercise - 08/22/19 0001      Knee/Hip Exercises: Standing   Heel Raises Limitations  heel walk - unable to raise heels > 1 cm off ground - help this position for 30" x5, then performed with upper half of body supported on table 2x10, greater range but still limited      Knee/Hip Exercises: Seated   Other Seated Knee/Hip Exercises  self mobilization to arch of foot - R with lacrosse ball 6 minutes    Other Seated Knee/Hip Exercises  towel scrunchies 3x10 B       Ankle Exercises: Stretches   Other Stretch  toe flexion stretch (focus on hallux) R - x10, 20" holds, PF stretch seated - unable to perform with foot on ground - performed with hand x10, 20" R             PT Education - 08/22/19 1506    Education Details  educated patient on benefits of compression device, mirror inervation with bilateral activation    Person(s) Educated  Patient    Methods  Explanation    Comprehension  Verbalized understanding       PT Short Term Goals -  08/10/19 1621      PT SHORT TERM GOAL #1   Title  Patient will be independent with initial HEP to improve functional outcomes    Time  2    Period  Weeks    Status  Achieved    Target Date  07/28/19        PT Long Term Goals - 08/10/19 1621      PT LONG TERM GOAL #1   Title  Patient will report at least 75% overall improvement in subjective complaint to indicate improvement in ability to perform ADLs.    Baseline  Current: 35%    Time  4    Period  Weeks    Status  On-going      PT LONG TERM GOAL #2   Title  Patient will improve BERG score to at least 55/56 to demo improvemetn in balance and reduced risk for falls.    Baseline  Current: 53    Time  4    Period  Weeks    Status  On-going      PT LONG TERM GOAL #3   Title  Patient will be able to ambulate at least 400 feet during 2MWT with LRAD to demonstrate improved ability to perform functional mobility and associated tasks.    Baseline  current: 294 feet with no  AD    Time  4    Period  Weeks    Status  On-going      PT LONG TERM GOAL #4   Title  Patient will have improved RT toe MMT to at least 4/5 flexion for improved ability to perform functional mobility and associated tasks.    Baseline  Current: 3+/5    Time  4    Period  Weeks    Status  On-going            Plan - 08/22/19 1511    Clinical Impression Statement  Decreased swelling noted in calf with compression stocking use. Continued swelling at malleoli but initial measurements may have been incorrect as both ankles measured significantly larger today. Will measure next session. Severe limitation in active plantar flexion noted, wound benefit from continued plantar flexion strengthening making sure no compensatory strategies are utilized during exercises. Patient would continue to benefit from skilled physical therapy.    Examination-Activity Limitations  Locomotion Level;Transfers;Dressing;Stand;Stairs;Bend;Squat    Examination-Participation Restrictions  Community Activity;Yard Work    Stability/Clinical Decision Making  Stable/Uncomplicated    Rehab Potential  Good    PT Frequency  2x / week    PT Duration  4 weeks    PT Treatment/Interventions  ADLs/Self Care Home Management;Therapeutic exercise;Balance training;Manual techniques;Taping;Therapeutic activities;Functional mobility training;Stair training;Gait training;Patient/family education;Energy conservation;Joint Manipulations;Passive range of motion;Neuromuscular re-education    PT Next Visit Plan  f/u with swelling, use of compression socks, PF strength (make sure he is not compensating - very limited AROM against gravity - try total gym)    PT Home Exercise Plan  07/13/19: calf steretch at wall, great toe plantarflexion with band; 2/17 arch curls seated, compression socks    Consulted and Agree with Plan of Care  Patient       Patient will benefit from skilled therapeutic intervention in order to improve the following  deficits and impairments:  Abnormal gait, Decreased endurance, Hypomobility, Decreased activity tolerance, Decreased strength, Decreased balance, Difficulty walking, Decreased mobility, Impaired flexibility, Decreased range of motion, Improper body mechanics  Visit Diagnosis: Other abnormalities of gait and mobility  Muscle  weakness (generalized)     Problem List Patient Active Problem List   Diagnosis Date Noted  . OA (osteoarthritis) of hip 12/09/2016  . HLD (hyperlipidemia) 09/17/2016  . Essential hypertension 09/17/2016  . Stroke ([place]) 09/17/2016  . Chronic GERD 09/17/2016  . Osteoarthritis 09/17/2016  . Knee joint replacement status, right 09/17/2016  . Colon polyp 09/17/2016    Eliezer Champagne 08/22/2019, 3:14 PM  [place] [address] [place], [place], [address] Phone: [phone]   Fax:  [phone]  Name: Gwendolyn Carthan MRN: [id number] Date of Birth: 13-Sep-1945

## 2019-08-24 ENCOUNTER — Ambulatory Visit (HOSPITAL_COMMUNITY): Payer: Medicare HMO | Admitting: Physical Therapy

## 2019-08-24 ENCOUNTER — Encounter (HOSPITAL_COMMUNITY): Payer: Self-pay | Admitting: Physical Therapy

## 2019-08-24 ENCOUNTER — Other Ambulatory Visit: Payer: Self-pay

## 2019-08-24 DIAGNOSIS — M6281 Muscle weakness (generalized): Secondary | ICD-10-CM

## 2019-08-24 DIAGNOSIS — R2689 Other abnormalities of gait and mobility: Secondary | ICD-10-CM | POA: Diagnosis not present

## 2019-08-24 NOTE — Therapy (Signed)
[place], [place], [address] Phone: [phone]   Fax:  [phone]  Physical Therapy Treatment  Patient Details  Name: Jeremiah Kelley MRN: [id number] Date of Birth: 1945/10/18 Referring Provider (PT): Allyn Kenner MD   Encounter Date: 08/24/2019  PT End of Session - 08/24/19 1402    Visit Number  11    Number of Visits  16    Date for PT Re-Evaluation  08/11/19    Authorization Type  [employer] M[employer] (submitted for 8 visits on 08/10/19), APPROVED 8 visists from 2-11 to 3-12    Authorization Time Period  07/13/19-08/11/19; 08/10/19-09/08/19    Authorization - Visit Number  4    Authorization - Number of Visits  8    PT Start Time  1402    PT Stop Time  1442    PT Time Calculation (min)  40 min    Equipment Utilized During Treatment  Gait belt    Activity Tolerance  Patient tolerated treatment well    Behavior During Therapy  [facility] for tasks assessed/performed       Past Medical History:  Diagnosis Date  . Arthritis    osteoarthritis knees hips  . GERD (gastroesophageal reflux disease)   . Hyperlipidemia   . Hypertension   . Stroke [facility])     Past Surgical History:  Procedure Laterality Date  . JOINT REPLACEMENT     right knee  . KNEE SURGERY Right 2017  . TONSILLECTOMY     age 75  . TONSILLECTOMY    . TOTAL HIP ARTHROPLASTY Left 12/09/2016   Procedure: LEFT TOTAL HIP ARTHROPLASTY ANTERIOR APPROACH;  Surgeon: Gaynelle Arabian, MD;  Location: [facility];  Service: Orthopedics;  Laterality: Left;    There were no vitals filed for this visit.  Subjective Assessment - 08/24/19 1405    Subjective  States he feels alright no complaints and no pain. States he thinks the swelling is better.    Pertinent History  RT TKA, LT THA    Limitations  Lifting;Standing;Walking;House hold activities    How long can you stand comfortably?  10 min    How long can you walk comfortably?  10 min    Patient Stated Goals  to be able to  stand and walk without pain    Pain Onset  Today         [facility] PT Assessment - 08/24/19 0001      Assessment   Medical Diagnosis  balance and RT toe stiffness     Referring Provider (PT)  Allyn Kenner MD      Circumferential Edema   Circumferential - Right  malleoli 10.75inches, calf 16 inches    Circumferential - Left   calf 15.75 inches at malleoli 10.25 inches      Ambulation/Gait   Ambulation/Gait  Yes    Ambulation/Gait Assistance  6: Modified independent (Device/Increase time)    Ambulation Distance (Feet)  376 Feet    Assistive device  None    Gait Pattern  Decreased stride length;Decreased dorsiflexion - right;Decreased step length - right    Ambulation Surface  Level;Indoor    Gait Comments  2MWT       Berg Balance Test   Sit to Stand  Able to stand without using hands and stabilize independently    Standing Unsupported  Able to stand safely 2 minutes    Sitting with Back Unsupported but Feet Supported on Floor or Stool  Able to sit safely  and securely 2 minutes    Stand to Sit  Sits safely with minimal use of hands    Transfers  Able to transfer safely, minor use of hands    Standing Unsupported with Eyes Closed  Able to stand 10 seconds safely    Standing Unsupported with Feet Together  Able to place feet together independently and stand 1 minute safely    From Standing, Reach Forward with Outstretched Arm  Can reach confidently >25 cm (10")    From Standing Position, Pick up Object from Floor  Able to pick up shoe safely and easily    From Standing Position, Turn to Look Behind Over each Shoulder  Looks behind from both sides and weight shifts well    Turn 360 Degrees  Able to turn 360 degrees safely in 4 seconds or less    Standing Unsupported, Alternately Place Feet on Step/Stool  Able to stand independently and safely and complete 8 steps in 20 seconds    Standing Unsupported, One Foot in [place] to place foot tandem independently and hold 30 seconds    Standing  on One Leg  Able to lift leg independently and hold > 10 seconds    Total Score  56                   [facility] Adult PT Treatment/Exercise - 08/24/19 0001      Knee/Hip Exercises: Standing   Other Standing Knee Exercises  rocker board lateral and forward holds 60 " x4,  lateral taps and fwd backwards taps 4x5 each               PT Short Term Goals - 08/10/19 1621      PT SHORT TERM GOAL #1   Title  Patient will be independent with initial HEP to improve functional outcomes    Time  2    Period  Weeks    Status  Achieved    Target Date  07/28/19        PT Long Term Goals - 08/24/19 1415      PT LONG TERM GOAL #1   Title  Patient will report at least 75% overall improvement in subjective complaint to indicate improvement in ability to perform ADLs.    Baseline  Current: 50% better    Time  4    Period  Weeks    Status  On-going      PT LONG TERM GOAL #2   Title  Patient will improve BERG score to at least 55/56 to demo improvemetn in balance and reduced risk for falls.    Baseline  Current: 56    Time  4    Period  Weeks    Status  Achieved      PT LONG TERM GOAL #3   Title  Patient will be able to ambulate at least 400 feet during 2MWT with LRAD to demonstrate improved ability to perform functional mobility and associated tasks.    Baseline  current: 376 feet with no AD    Time  4    Period  Weeks    Status  On-going      PT LONG TERM GOAL #4   Title  Patient will have improved RT toe MMT to at least 4/5 flexion for improved ability to perform functional mobility and associated tasks.    Baseline  Current: 3+/5    Time  4    Period  Weeks  Status  On-going            Plan - 08/24/19 1504    Clinical Impression Statement  Patient tolerated session well. Improved 2MW and berg balance score. Patient has met  LTG on this date. Patient able to perform lateral taps on rocker board well but demonstrated difficulties with forwards and backwards  taps. Will follow up with this  next session and continue to work on functional strength and balance.    Examination-Activity Limitations  Locomotion Level;Transfers;Dressing;Stand;Stairs;Bend;Squat    Examination-Participation Restrictions  Community Activity;Yard Work    Stability/Clinical Decision Making  Stable/Uncomplicated    Rehab Potential  Good    PT Frequency  2x / week    PT Duration  4 weeks    PT Treatment/Interventions  ADLs/Self Care Home Management;Therapeutic exercise;Balance training;Manual techniques;Taping;Therapeutic activities;Functional mobility training;Stair training;Gait training;Patient/family education;Energy conservation;Joint Manipulations;Passive range of motion;Neuromuscular re-education    PT Next Visit Plan  f/u with swelling, use of compression socks, PF strength (make sure he is not compensating - very limited AROM against gravity - try total gym), rocker board forward and backward taps    PT Home Exercise Plan  07/13/19: calf steretch at wall, great toe plantarflexion with band; 2/17 arch curls seated, compression socks    Consulted and Agree with Plan of Care  Patient       Patient will benefit from skilled therapeutic intervention in order to improve the following deficits and impairments:  Abnormal gait, Decreased endurance, Hypomobility, Decreased activity tolerance, Decreased strength, Decreased balance, Difficulty walking, Decreased mobility, Impaired flexibility, Decreased range of motion, Improper body mechanics  Visit Diagnosis: Other abnormalities of gait and mobility  Muscle weakness (generalized)     Problem List Patient Active Problem List   Diagnosis Date Noted  . OA (osteoarthritis) of hip 12/09/2016  . HLD (hyperlipidemia) 09/17/2016  . Essential hypertension 09/17/2016  . Stroke ([place]) 09/17/2016  . Chronic GERD 09/17/2016  . Osteoarthritis 09/17/2016  . Knee joint replacement status, right 09/17/2016  . Colon polyp 09/17/2016    3:04 PM, 08/24/19 Jerene Pitch, DPT Physical Therapy with [facility]  [phone] office  [place] [address] [place], [place], [address] Phone: [phone]   Fax:  [phone]  Name: Jeremiah Kelley MRN: [id number] Date of Birth: Apr 03, 1946

## 2019-08-29 ENCOUNTER — Ambulatory Visit (HOSPITAL_COMMUNITY): Payer: Medicare HMO | Admitting: Physical Therapy

## 2019-08-31 ENCOUNTER — Ambulatory Visit (HOSPITAL_COMMUNITY): Payer: Medicare HMO | Attending: Internal Medicine | Admitting: Physical Therapy

## 2019-08-31 ENCOUNTER — Other Ambulatory Visit: Payer: Self-pay

## 2019-08-31 DIAGNOSIS — M6281 Muscle weakness (generalized): Secondary | ICD-10-CM | POA: Diagnosis not present

## 2019-08-31 DIAGNOSIS — R2689 Other abnormalities of gait and mobility: Secondary | ICD-10-CM

## 2019-08-31 NOTE — Therapy (Addendum)
[facility] [address] [place], [place], [address] Phone: [phone]   Fax:  [phone]  Physical Therapy Treatment and Discharge Note  Patient Details  Name: Jeremiah Kelley MRN: [id number] Date of Birth: Nov 20, 1945 Referring Provider (PT): Allyn Kenner MD   PHYSICAL THERAPY DISCHARGE SUMMARY  Visits from Start of Care: 12  Current functional level related to goals / functional outcomes: Unable to assess secondary to unplanned discharge    Remaining deficits: Unable to assess secondary to unplanned discharge    Education / Equipment: See below Plan: Patient agrees to discharge.  Patient goals were partially met. Patient is being discharged due to the physician's request.  ?????     2:12 PM, 04/01/20 Jerene Pitch, DPT Physical Therapy with [facility]  [phone] office   Encounter Date: 08/31/2019  PT End of Session - 08/31/19 1538    Visit Number  12    Number of Visits  16    Date for PT Re-Evaluation  08/11/19    Authorization Type  [employer] (submitted for 8 visits on 08/10/19), APPROVED 8 visists from 2-11 to 3-12    Authorization Time Period  07/13/19-08/11/19; 08/10/19-09/08/19    Authorization - Visit Number  5    Authorization - Number of Visits  8    PT Start Time  9774    PT Stop Time  1610    PT Time Calculation (min)  40 min    Equipment Utilized During Treatment  Gait belt    Activity Tolerance  Patient tolerated treatment well;Patient limited by fatigue    Behavior During Therapy  WFL for tasks assessed/performed       Past Medical History:  Diagnosis Date  . Arthritis    osteoarthritis knees hips  . GERD (gastroesophageal reflux disease)   . Hyperlipidemia   . Hypertension   . Stroke [facility])     Past Surgical History:  Procedure Laterality Date  . JOINT REPLACEMENT     right knee  . KNEE SURGERY Right 2017  . TONSILLECTOMY     age 35  . TONSILLECTOMY    . TOTAL HIP  ARTHROPLASTY Left 12/09/2016   Procedure: LEFT TOTAL HIP ARTHROPLASTY ANTERIOR APPROACH;  Surgeon: Gaynelle Arabian, MD;  Location: [facility];  Service: Orthopedics;  Laterality: Left;    There were no vitals filed for this visit.  Subjective Assessment - 08/31/19 1537    Subjective  Patient says he is doing a little better. Has been doing his HEP, has been wearing compression socks. Feels a little helpful. No pain currently.    Pertinent History  RT TKA, LT THA    Limitations  Lifting;Standing;Walking;House hold activities    How long can you stand comfortably?  10 min    How long can you walk comfortably?  10 min    Patient Stated Goals  to be able to stand and walk without pain    Currently in Pain?  No/denies    Pain Onset  Today                       [facility] Adult PT Treatment/Exercise - 08/31/19 0001      Knee/Hip Exercises: Stretches   Gastroc Stretch  Both;3 reps;30 seconds    Gastroc Stretch Limitations  slant board    Other Knee/Hip Stretches  POE 5 minute       Knee/Hip Exercises: Standing   Heel Raises  Both;2 sets;10 reps  Heel Raises Limitations  from step     Stairs  4 RT, 7 inch step, single hand rail, reciprocal gait, cues for eccentric lowering on RT ankle     Rocker Board  2 minutes   FWD/ LAT; 10x taps each; 1 min static holds heach    Other Standing Knee Exercises  standing toe raises 2x10      Manual Therapy   Manual Therapy  Soft tissue mobilization    Manual therapy comments  all manual interventions performed indepedently of other interventions    Soft tissue mobilization  IASTM using theragun to bilateral lumbar paraspinals with patient in prone              PT Education - 08/31/19 1632    Education Details  On observed gait and postural deficits, purpose of POE test/ retes on stair ambulation, on purpose and funciton of added manual IASTM with theragun and addition of REIS to HEP    Person(s) Educated  Patient    Methods   Explanation    Comprehension  Verbalized understanding       PT Short Term Goals - 08/10/19 1621      PT SHORT TERM GOAL #1   Title  Patient will be independent with initial HEP to improve functional outcomes    Time  2    Period  Weeks    Status  Achieved    Target Date  07/28/19        PT Long Term Goals - 08/24/19 1415      PT LONG TERM GOAL #1   Title  Patient will report at least 75% overall improvement in subjective complaint to indicate improvement in ability to perform ADLs.    Baseline  Current: 50% better    Time  4    Period  Weeks    Status  On-going      PT LONG TERM GOAL #2   Title  Patient will improve BERG score to at least 55/56 to demo improvemetn in balance and reduced risk for falls.    Baseline  Current: 56    Time  4    Period  Weeks    Status  Achieved      PT LONG TERM GOAL #3   Title  Patient will be able to ambulate at least 400 feet during 2MWT with LRAD to demonstrate improved ability to perform functional mobility and associated tasks.    Baseline  current: 376 feet with no AD    Time  4    Period  Weeks    Status  On-going      PT LONG TERM GOAL #4   Title  Patient will have improved RT toe MMT to at least 4/5 flexion for improved ability to perform functional mobility and associated tasks.    Baseline  Current: 3+/5    Time  4    Period  Weeks    Status  On-going            Plan - 08/31/19 1627    Clinical Impression Statement  Patient presents with continued edema on RLE compared to LT despite use of compression stocking. Patient presents in forward flexed trunk position, decreased stance on LLE, but reports no LLE pain. Patient noted increased tension in lumbar region with stairs, and showed difficulty ascending on LLE. Performed manual IASTM to lumbar paraspinals to address muscle restrictions and performed test retest on stair function after having patient lay POE for 5  minutes. Patient showed some improvement in ability to  perform standing toe raises, and improvement in upright standing posture. Patient noted slight improvement in walking after, and also slight improvement in stair ambulation. Patient educated on adding REIS to home program, will follow up next visit.    Examination-Activity Limitations  Locomotion Level;Transfers;Dressing;Stand;Stairs;Bend;Squat    Examination-Participation Restrictions  Community Activity;Yard Work    Stability/Clinical Decision Making  Stable/Uncomplicated    Rehab Potential  Good    PT Frequency  2x / week    PT Duration  4 weeks    PT Treatment/Interventions  ADLs/Self Care Home Management;Therapeutic exercise;Balance training;Manual techniques;Taping;Therapeutic activities;Functional mobility training;Stair training;Gait training;Patient/family education;Energy conservation;Joint Manipulations;Passive range of motion;Neuromuscular re-education    PT Next Visit Plan  Continue to progress as tolerated with focus on dynamaic balance and fucntioanl strength. F/U with REIS for HEP. Try total gym next visit.    PT Home Exercise Plan  07/13/19: calf stretch at wall, great toe plantarflexion with band; 2/17 arch curls seated, compression socks 08/31/19: REIS    Consulted and Agree with Plan of Care  Patient       Patient will benefit from skilled therapeutic intervention in order to improve the following deficits and impairments:  Abnormal gait, Decreased endurance, Hypomobility, Decreased activity tolerance, Decreased strength, Decreased balance, Difficulty walking, Decreased mobility, Impaired flexibility, Decreased range of motion, Improper body mechanics  Visit Diagnosis: Other abnormalities of gait and mobility  Muscle weakness (generalized)     Problem List Patient Active Problem List   Diagnosis Date Noted  . OA (osteoarthritis) of hip 12/09/2016  . HLD (hyperlipidemia) 09/17/2016  . Essential hypertension 09/17/2016  . Stroke ([place]) 09/17/2016  . Chronic GERD  09/17/2016  . Osteoarthritis 09/17/2016  . Knee joint replacement status, right 09/17/2016  . Colon polyp 09/17/2016    4:35 PM, 08/31/19 Josue Hector PT DPT  Physical Therapist with [facility]  [phone]   [facility] [address] [place], [place], [address] Phone: [phone]   Fax:  [phone]  Name: Jeremiah Kelley MRN: [id number] Date of Birth: 12-22-1945

## 2019-09-05 ENCOUNTER — Ambulatory Visit (HOSPITAL_COMMUNITY): Payer: Medicare HMO | Admitting: Physical Therapy

## 2019-09-05 ENCOUNTER — Telehealth (HOSPITAL_COMMUNITY): Payer: Self-pay | Admitting: Physical Therapy

## 2019-09-05 NOTE — Telephone Encounter (Signed)
pt cancelled appts for today and thursday because his wife has a home health appt today and he [facility]

## 2019-09-07 ENCOUNTER — Ambulatory Visit (HOSPITAL_COMMUNITY): Payer: Medicare HMO | Admitting: Physical Therapy

## 2019-09-13 DIAGNOSIS — Z712 Person consulting for explanation of examination or test findings: Secondary | ICD-10-CM | POA: Diagnosis not present

## 2019-09-13 DIAGNOSIS — E782 Mixed hyperlipidemia: Secondary | ICD-10-CM | POA: Diagnosis not present

## 2019-09-13 DIAGNOSIS — M19041 Primary osteoarthritis, right hand: Secondary | ICD-10-CM | POA: Diagnosis not present

## 2019-09-13 DIAGNOSIS — Z6829 Body mass index (BMI) 29.0-29.9, adult: Secondary | ICD-10-CM | POA: Diagnosis not present

## 2019-09-13 DIAGNOSIS — M67432 Ganglion, left wrist: Secondary | ICD-10-CM | POA: Diagnosis not present

## 2019-09-13 DIAGNOSIS — I10 Essential (primary) hypertension: Secondary | ICD-10-CM | POA: Diagnosis not present

## 2019-09-13 DIAGNOSIS — M19042 Primary osteoarthritis, left hand: Secondary | ICD-10-CM | POA: Diagnosis not present

## 2019-09-13 DIAGNOSIS — Z0000 Encounter for general adult medical examination without abnormal findings: Secondary | ICD-10-CM | POA: Diagnosis not present

## 2019-09-13 DIAGNOSIS — K219 Gastro-esophageal reflux disease without esophagitis: Secondary | ICD-10-CM | POA: Diagnosis not present

## 2019-09-18 ENCOUNTER — Telehealth (HOSPITAL_COMMUNITY): Payer: Self-pay | Admitting: Physical Therapy

## 2019-09-18 NOTE — Telephone Encounter (Signed)
Need re-Auth from insurance when pt returns - s/w pt and he will call us back to schedule after his visit with Dr. Wende Neighbors. NF 09/18/19

## 2019-09-19 DIAGNOSIS — E782 Mixed hyperlipidemia: Secondary | ICD-10-CM | POA: Diagnosis not present

## 2019-09-19 DIAGNOSIS — M19042 Primary osteoarthritis, left hand: Secondary | ICD-10-CM | POA: Diagnosis not present

## 2019-09-19 DIAGNOSIS — I10 Essential (primary) hypertension: Secondary | ICD-10-CM | POA: Diagnosis not present

## 2019-09-19 DIAGNOSIS — R7301 Impaired fasting glucose: Secondary | ICD-10-CM | POA: Diagnosis not present

## 2019-09-19 DIAGNOSIS — G4700 Insomnia, unspecified: Secondary | ICD-10-CM | POA: Diagnosis not present

## 2019-09-19 DIAGNOSIS — M1990 Unspecified osteoarthritis, unspecified site: Secondary | ICD-10-CM | POA: Diagnosis not present

## 2019-09-19 DIAGNOSIS — Z0001 Encounter for general adult medical examination with abnormal findings: Secondary | ICD-10-CM | POA: Diagnosis not present

## 2019-09-19 DIAGNOSIS — M19041 Primary osteoarthritis, right hand: Secondary | ICD-10-CM | POA: Diagnosis not present

## 2019-09-19 DIAGNOSIS — M67432 Ganglion, left wrist: Secondary | ICD-10-CM | POA: Diagnosis not present

## 2019-09-19 DIAGNOSIS — K219 Gastro-esophageal reflux disease without esophagitis: Secondary | ICD-10-CM | POA: Diagnosis not present

## 2019-09-26 DIAGNOSIS — R7301 Impaired fasting glucose: Secondary | ICD-10-CM | POA: Diagnosis not present

## 2019-09-26 DIAGNOSIS — M19041 Primary osteoarthritis, right hand: Secondary | ICD-10-CM | POA: Diagnosis not present

## 2019-09-26 DIAGNOSIS — E782 Mixed hyperlipidemia: Secondary | ICD-10-CM | POA: Diagnosis not present

## 2019-09-26 DIAGNOSIS — M67432 Ganglion, left wrist: Secondary | ICD-10-CM | POA: Diagnosis not present

## 2019-09-26 DIAGNOSIS — M19042 Primary osteoarthritis, left hand: Secondary | ICD-10-CM | POA: Diagnosis not present

## 2019-09-26 DIAGNOSIS — G4700 Insomnia, unspecified: Secondary | ICD-10-CM | POA: Diagnosis not present

## 2019-09-26 DIAGNOSIS — M1990 Unspecified osteoarthritis, unspecified site: Secondary | ICD-10-CM | POA: Diagnosis not present

## 2019-09-26 DIAGNOSIS — I10 Essential (primary) hypertension: Secondary | ICD-10-CM | POA: Diagnosis not present

## 2019-09-26 DIAGNOSIS — K219 Gastro-esophageal reflux disease without esophagitis: Secondary | ICD-10-CM | POA: Diagnosis not present

## 2019-10-12 ENCOUNTER — Telehealth (HOSPITAL_COMMUNITY): Payer: Self-pay | Admitting: Physical Therapy

## 2019-10-12 NOTE — Telephone Encounter (Signed)
per the pt he is ok with being discharged since he does not have a f/u doctor at this time.

## 2019-10-12 NOTE — Telephone Encounter (Signed)
s/w the pt to see what was the status of him coming back to therapy. the last note entered was that he was going to see dr [employer]. Per the pt he has ssen the dm and does not know what he wants him to do. Per pt dr hall is not practicing anymore.

## 2020-01-11 DIAGNOSIS — M1712 Unilateral primary osteoarthritis, left knee: Secondary | ICD-10-CM | POA: Diagnosis not present

## 2020-04-30 IMAGING — CT CT WRIST*L* W/O CM
3 of 4 series · 11 of 20 positions shown, 13 images · non-contrast
Comparison: None.

CLINICAL DATA: History of wrist fusion. Patient has increased pain
at the second and third metacarpals.

EXAM:
CT OF THE LEFT WRIST WITHOUT CONTRAST
TECHNIQUE: Multidetector CT imaging was performed according to the standard
protocol. Multiplanar CT image reconstructions were also generated.

[Series 3: ext soft · axial · 0.35mm/px · z∈[+81,+209]mm · 5 of 98 slices shown]
[im 17/98  soft-tissue]
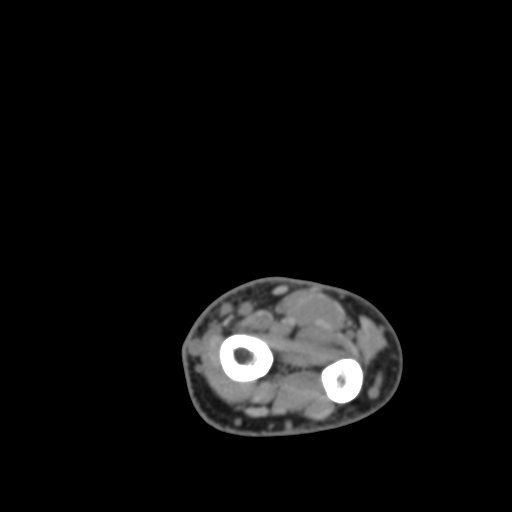
[im 33/98  soft-tissue]
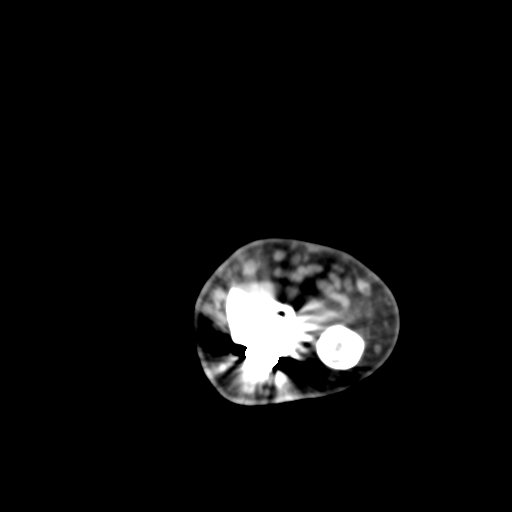
[im 49/98  soft-tissue]
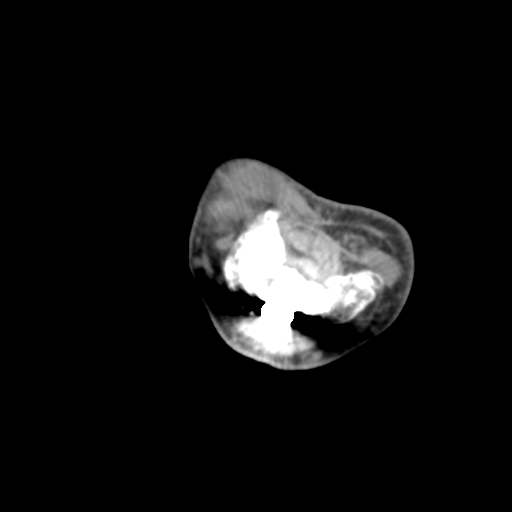
[im 65/98  soft-tissue]
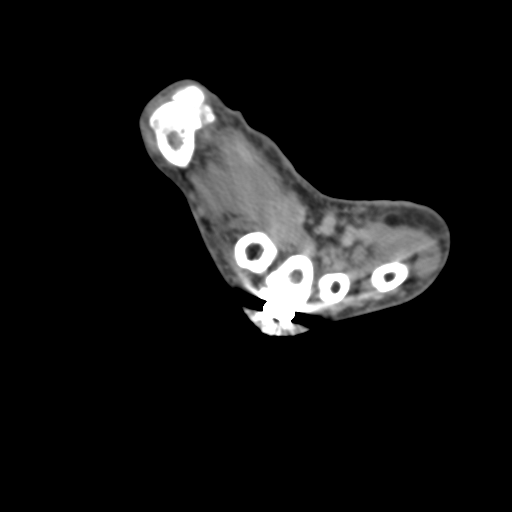
[im 81/98  soft-tissue]
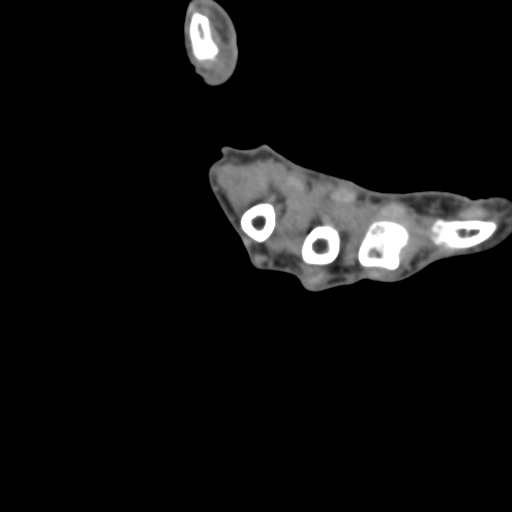

[Series 7: metal reduction (person_name) · axial · 0.35mm/px · z∈[+81,+209]mm · 5 of 98 slices shown, 7 images]
[im 17/98  soft-tissue]
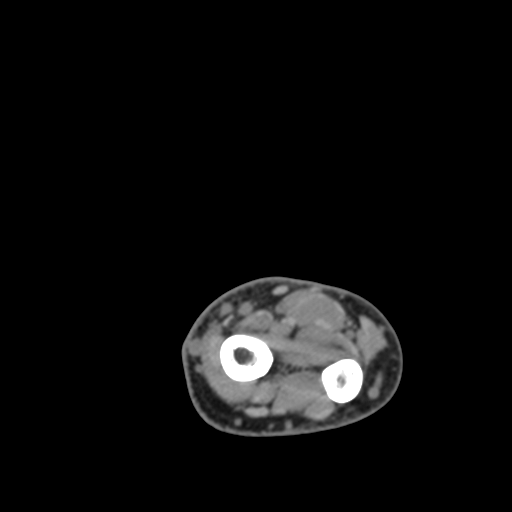
[im 17/98  bone]
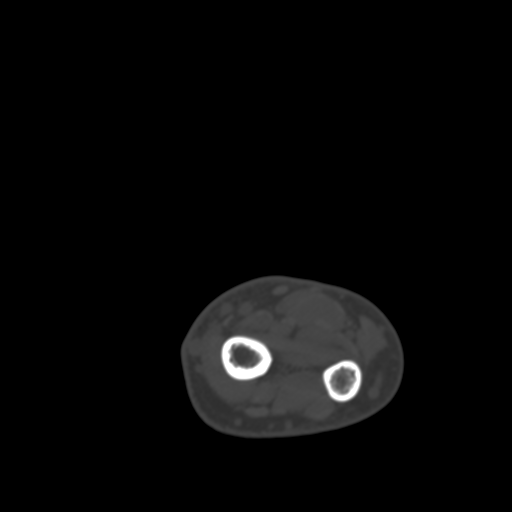
[im 33/98  bone]
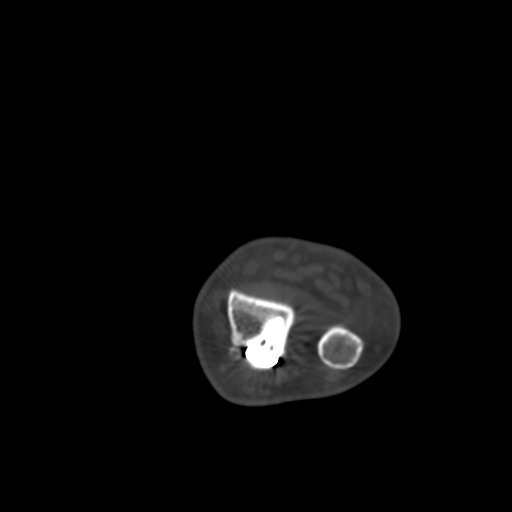
[im 49/98  bone]
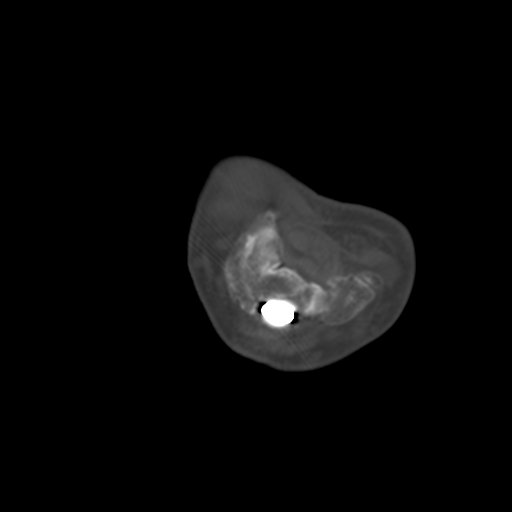
[im 65/98  bone]
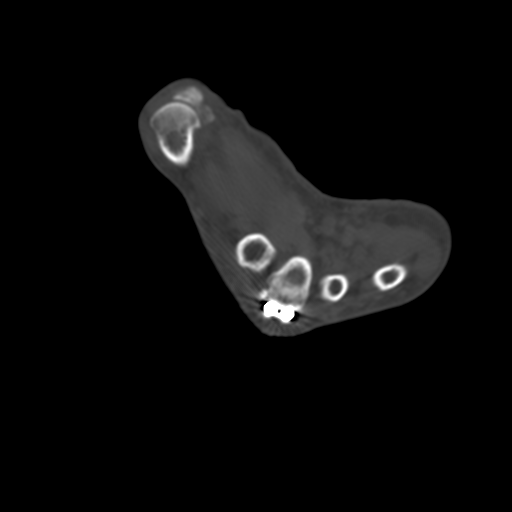
[im 81/98  soft-tissue]
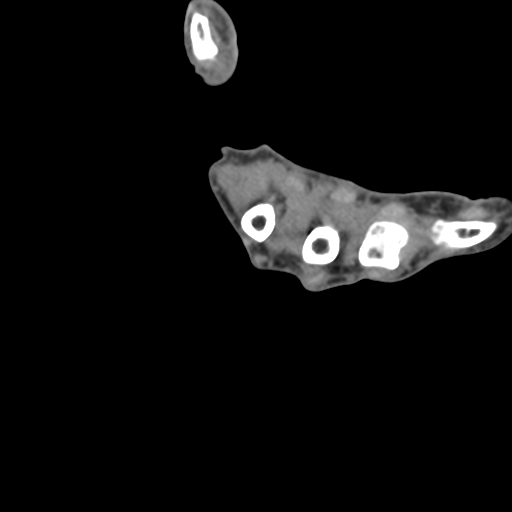
[im 81/98  bone]
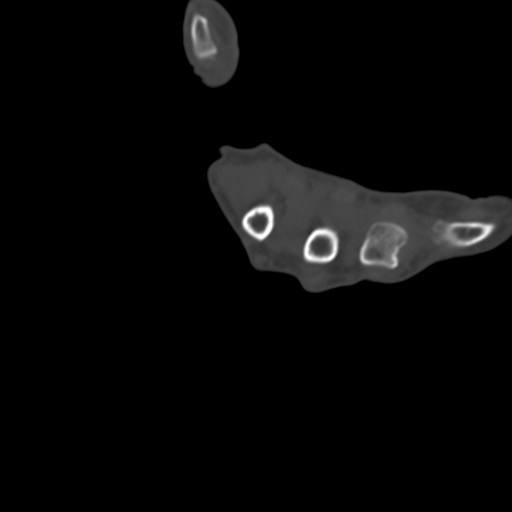

[Series 602: cor bone · coronal · 0.38mm/px · 1 of 43 slices shown]
[im 22/43  bone]
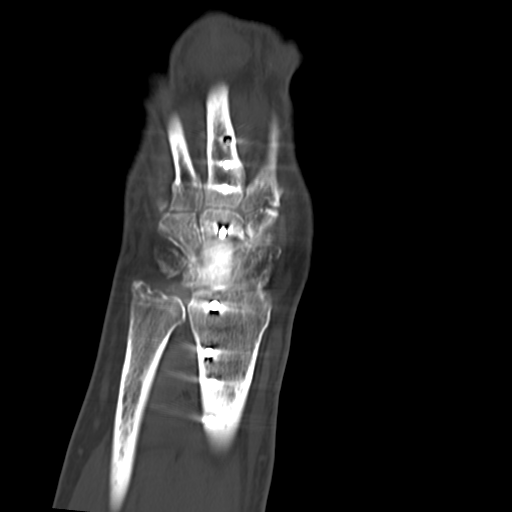

[11 of 20 positions shown; findings below may reference images not displayed]

FINDINGS: There is a long formed dorsal plate and multiple screws along the
dorsum of the wrist. The hardware appears intact without
complicating features. Central radiocarpal and intercarpal fusion
changes. The fusion appears solid centrally and radially. There is
also partial fusion at the capitate articulation with the base of
the third metacarpal. The hardware extends along the proximal shaft
of the third metacarpal.

The distal most aspect of the plate is projecting dorsally along the
third metacarpal. It is approximately 7.5 mm from the dorsal cortex
and is tenting the skin. It also appears to create traction on the
extensor tendons of the second and third metacarpals which are
pulled dorsally and centrally. There is also moderate, probable
chronic, tenosynovitis involving the extensor tendon of the third
digit. This may explain the patient's pain.

Moderate to advanced degenerative changes at the carpometacarpal
joint of the thumb. There are also degenerative changes at the
scaphoid articulation with the trapezium and trapezoid bones.
Mild-to-moderate radioulnar joint degenerative changes and probable
chondrocalcinosis. There are hooked osteophytes involving the second
and third metacarpal heads and mild degenerative changes at the
metacarpophalangeal joints which could be due to CPPD arthropathy.
IMPRESSION: 1. Intact fusion hardware and solid-appearing central and radial
radiocarpal and intercarpal fusion.
2. The distal aspect of the dorsal plate projects dorsally and is
tenting the skin and also appears to have a traction affect on the
second and third extensor tendons as detailed above. There is also
probable chronic tenosynovitis involving the extensor tendon of the
third digit.
3. Suspect underlying changes of CPPD arthropathy.
4. Advanced degenerative changes at the carpometacarpal joint of the
thumb.

## 2020-05-20 DIAGNOSIS — M19041 Primary osteoarthritis, right hand: Secondary | ICD-10-CM | POA: Diagnosis not present

## 2020-05-20 DIAGNOSIS — M19042 Primary osteoarthritis, left hand: Secondary | ICD-10-CM | POA: Diagnosis not present

## 2020-05-20 DIAGNOSIS — G4700 Insomnia, unspecified: Secondary | ICD-10-CM | POA: Diagnosis not present

## 2020-05-20 DIAGNOSIS — M67432 Ganglion, left wrist: Secondary | ICD-10-CM | POA: Diagnosis not present

## 2020-05-20 DIAGNOSIS — K219 Gastro-esophageal reflux disease without esophagitis: Secondary | ICD-10-CM | POA: Diagnosis not present

## 2020-05-20 DIAGNOSIS — E782 Mixed hyperlipidemia: Secondary | ICD-10-CM | POA: Diagnosis not present

## 2020-05-20 DIAGNOSIS — M1990 Unspecified osteoarthritis, unspecified site: Secondary | ICD-10-CM | POA: Diagnosis not present

## 2020-05-20 DIAGNOSIS — R7301 Impaired fasting glucose: Secondary | ICD-10-CM | POA: Diagnosis not present

## 2020-05-20 DIAGNOSIS — I10 Essential (primary) hypertension: Secondary | ICD-10-CM | POA: Diagnosis not present

## 2020-05-27 NOTE — Progress Notes (Addendum)
COVID Vaccine Completed:  x1 Date COVID Vaccine completed:  April 2021 COVID vaccine manufacturer: [employer]   PCP - Allyn Kenner, MD Cardiologist -   Chest x-ray -  EKG - 05-28-20 in Epic Stress Test -  ECHO - Results on chart from [place] in [place],  Cardiac Cath -  Pacemaker/ICD device last checked:  Sleep Study -  CPAP -   Fasting Blood Sugar -  Checks Blood Sugar _____ times a day  Blood Thinner Instructions: Aspirin Instructions:  ASA 81 mg Last Dose:  05-27-20  Anesthesia review:  LBBB on EKG  Patient denies shortness of breath, fever, cough and chest pain at PAT appointment.  Pt able to climb a flight of stairs and perform ADL's without assistance.   Patient verbalized understanding of instructions that were given to them at the PAT appointment. Patient was also instructed that they will need to review over the PAT instructions again at home before surgery.

## 2020-05-27 NOTE — Patient Instructions (Addendum)
DUE TO COVID-19 ONLY ONE VISITOR IS ALLOWED TO COME WITH YOU AND STAY IN THE WAITING ROOM ONLY DURING PRE OP AND PROCEDURE.   IF YOU WILL BE ADMITTED INTO THE HOSPITAL YOU ARE ALLOWED ONE SUPPORT PERSON DURING VISITATION HOURS ONLY (10AM -8PM)   . The support person may change daily. . The support person must pass our screening, gel in and out, and wear a mask at all times, including in the patient's room. . Patients must also wear a mask when staff or their support person are in the room.   COVID SWAB TESTING MUST BE COMPLETED ON:  Thursday, 05-30-20 @ 11:00 AM   [address] [place], [place] [address]  (Must self quarantine after testing. Follow instructions on handout.)        Your procedure is scheduled on:  Monday, 06-03-20   Report to [facility] Main  Entrance   Report to Short Stay at 5:50 AM   [facility])    Call this number if you have problems the morning of surgery [phone]   Do not eat food :After Midnight.   May have liquids until  5:15 AM  day of surgery  CLEAR LIQUID DIET  Foods Allowed                                                                     Foods Excluded  Water, Black Coffee and tea, regular and decaf             liquids that you cannot  Plain Jell-O in any flavor  (No red)                                    see through such as: Fruit ices (not with fruit pulp)                                      milk, soups, orange juice              Iced Popsicles (No red)                                      All solid food                                   Apple juices Sports drinks like Gatorade (No red) Lightly seasoned clear broth or consume(fat free) Sugar, honey syrup     Complete one Ensure drink the morning of surgery at 5:15 AM  the day of surgery.     Oral Hygiene is also important to reduce your risk of infection.                                    Remember - BRUSH YOUR TEETH THE MORNING OF SURGERY WITH YOUR REGULAR  TOOTHPASTE   Do NOT smoke after  Midnight   Take these medicines the morning of surgery with A SIP OF WATER:  Amlodipine, Protonix, Simvastatin                              You may not have any metal on your body including  jewelry, and body piercings             Do not wear  lotions, powders, perfumes/cologne, or deodorant             Men may shave face and neck.   Do not bring valuables to the hospital. [place].   Contacts, dentures or bridgework may not be worn into surgery.   Bring small overnight bag day of surgery.                Please read over the following fact sheets you were given: IF YOU HAVE QUESTIONS ABOUT YOUR PRE OP INSTRUCTIONS PLEASE CALL [phone]   [place] - Preparing for Surgery Before surgery, you can play an important role.  Because skin is not sterile, your skin needs to be as free of germs as possible.  You can reduce the number of germs on your skin by washing with CHG (chlorahexidine gluconate) soap before surgery.  CHG is an antiseptic cleaner which kills germs and bonds with the skin to continue killing germs even after washing. Please DO NOT use if you have an allergy to CHG or antibacterial soaps.  If your skin becomes reddened/irritated stop using the CHG and inform your nurse when you arrive at Short Stay. Do not shave (including legs and underarms) for at least 48 hours prior to the first CHG shower.  You may shave your face/neck.  Please follow these instructions carefully:  1.  Shower with CHG Soap the night before surgery and the  morning of surgery.  2.  If you choose to wash your hair, wash your hair first as usual with your normal  shampoo.  3.  After you shampoo, rinse your hair and body thoroughly to remove the shampoo.                             4.  Use CHG as you would any other liquid soap.  You can apply chg directly to the skin and wash.  Gently with a scrungie or clean washcloth.  5.  Apply the  CHG Soap to your body ONLY FROM THE NECK DOWN.   Do   not use on face/ open                           Wound or open sores. Avoid contact with eyes, ears mouth and   genitals (private parts).                       Wash face,  Genitals (private parts) with your normal soap.             6.  Wash thoroughly, paying special attention to the area where your    surgery  will be performed.  7.  Thoroughly rinse your body with warm water from the neck down.  8.  DO NOT shower/wash with your normal soap after using and rinsing off the CHG Soap.  9.  Pat yourself dry with a clean towel.            10.  Wear clean pajamas.            11.  Place clean sheets on your bed the night of your first shower and do not  sleep with pets. Day of Surgery : Do not apply any lotions/deodorants the morning of surgery.  Please wear clean clothes to the hospital/surgery center.  FAILURE TO FOLLOW THESE INSTRUCTIONS MAY RESULT IN THE CANCELLATION OF YOUR SURGERY  PATIENT SIGNATURE_________________________________  NURSE SIGNATURE__________________________________  ________________________________________________________________________   Adam Phenix  An incentive spirometer is a tool that can help keep your lungs clear and active. This tool measures how well you are filling your lungs with each breath. Taking long deep breaths may help reverse or decrease the chance of developing breathing (pulmonary) problems (especially infection) following:  A long period of time when you are unable to move or be active. BEFORE THE PROCEDURE   If the spirometer includes an indicator to show your best effort, your nurse or respiratory therapist will set it to a desired goal.  If possible, sit up straight or lean slightly forward. Try not to slouch.  Hold the incentive spirometer in an upright position. INSTRUCTIONS FOR USE  1. Sit on the edge of your bed if possible, or sit up as far as you can in bed or  on a chair. 2. Hold the incentive spirometer in an upright position. 3. Breathe out normally. 4. Place the mouthpiece in your mouth and seal your lips tightly around it. 5. Breathe in slowly and as deeply as possible, raising the piston or the ball toward the top of the column. 6. Hold your breath for 3-5 seconds or for as long as possible. Allow the piston or ball to fall to the bottom of the column. 7. Remove the mouthpiece from your mouth and breathe out normally. 8. Rest for a few seconds and repeat Steps 1 through 7 at least 10 times every 1-2 hours when you are awake. Take your time and take a few normal breaths between deep breaths. 9. The spirometer may include an indicator to show your best effort. Use the indicator as a goal to work toward during each repetition. 10. After each set of 10 deep breaths, practice coughing to be sure your lungs are clear. If you have an incision (the cut made at the time of surgery), support your incision when coughing by placing a pillow or rolled up towels firmly against it. Once you are able to get out of bed, walk around indoors and cough well. You may stop using the incentive spirometer when instructed by your caregiver.  RISKS AND COMPLICATIONS  Take your time so you do not get dizzy or light-headed.  If you are in pain, you may need to take or ask for pain medication before doing incentive spirometry. It is harder to take a deep breath if you are having pain. AFTER USE  Rest and breathe slowly and easily.  It can be helpful to keep track of a log of your progress. Your caregiver can provide you with a simple table to help with this. If you are using the spirometer at home, follow these instructions: [place] IF:   You are having difficultly using the spirometer.  You have trouble using the spirometer as often as instructed.  Your pain medication is not giving enough relief while using the spirometer.  You  develop fever of 100.5 F  (38.1 C) or higher. SEEK IMMEDIATE MEDICAL CARE IF:   You cough up bloody sputum that had not been present before.  You develop fever of 102 F (38.9 C) or greater.  You develop worsening pain at or near the incision site. MAKE SURE YOU:   Understand these instructions.  Will watch your condition.  Will get help right away if you are not doing well or get worse. Document Released: 10/26/2006 Document Revised: 09/07/2011 Document Reviewed: 12/27/2006 ExitCare Patient Information 2014 ExitCare, [place].   ________________________________________________________________________  WHAT IS A BLOOD TRANSFUSION? Blood Transfusion Information  A transfusion is the replacement of blood or some of its parts. Blood is made up of multiple cells which provide different functions.  Red blood cells carry oxygen and are used for blood loss replacement.  White blood cells fight against infection.  Platelets control bleeding.  Plasma helps clot blood.  Other blood products are available for specialized needs, such as hemophilia or other clotting disorders. BEFORE THE TRANSFUSION  Who gives blood for transfusions?   Healthy volunteers who are fully evaluated to make sure their blood is safe. This is blood bank blood. Transfusion therapy is the safest it has ever been in the practice of medicine. Before blood is taken from a donor, a complete history is taken to make sure that person has no history of diseases nor engages in risky social behavior (examples are intravenous drug use or sexual activity with multiple partners). The donor's travel history is screened to minimize risk of transmitting infections, such as malaria. The donated blood is tested for signs of infectious diseases, such as HIV and hepatitis. The blood is then tested to be sure it is compatible with you in order to minimize the chance of a transfusion reaction. If you or a relative donates blood, this is often done in anticipation  of surgery and is not appropriate for emergency situations. It takes many days to process the donated blood. RISKS AND COMPLICATIONS Although transfusion therapy is very safe and saves many lives, the main dangers of transfusion include:   Getting an infectious disease.  Developing a transfusion reaction. This is an allergic reaction to something in the blood you were given. Every precaution is taken to prevent this. The decision to have a blood transfusion has been considered carefully by your caregiver before blood is given. Blood is not given unless the benefits outweigh the risks. AFTER THE TRANSFUSION  Right after receiving a blood transfusion, you will usually feel much better and more energetic. This is especially true if your red blood cells have gotten low (anemic). The transfusion raises the level of the red blood cells which carry oxygen, and this usually causes an energy increase.  The nurse administering the transfusion will monitor you carefully for complications. HOME CARE INSTRUCTIONS  No special instructions are needed after a transfusion. You may find your energy is better. Speak with your caregiver about any limitations on activity for underlying diseases you may have. SEEK MEDICAL CARE IF:   Your condition is not improving after your transfusion.  You develop redness or irritation at the intravenous (IV) site. SEEK IMMEDIATE MEDICAL CARE IF:  Any of the following symptoms occur over the next 12 hours:  Shaking chills.  You have a temperature by mouth above 102 F (38.9 C), not controlled by medicine.  Chest, back, or muscle pain.  People around you feel you are not acting correctly or are confused.  Shortness of  breath or difficulty breathing.  Dizziness and fainting.  You get a rash or develop hives.  You have a decrease in urine output.  Your urine turns a dark color or changes to pink, red, or brown. Any of the following symptoms occur over the next 10  days:  You have a temperature by mouth above 102 F (38.9 C), not controlled by medicine.  Shortness of breath.  Weakness after normal activity.  The white part of the eye turns yellow (jaundice).  You have a decrease in the amount of urine or are urinating less often.  Your urine turns a dark color or changes to pink, red, or brown. Document Released: 06/12/2000 Document Revised: 09/07/2011 Document Reviewed: 01/30/2008 [facility] Patient Information 2014 [place], [place].  _______________________________________________________________________

## 2020-05-28 ENCOUNTER — Encounter (HOSPITAL_COMMUNITY)
Admission: RE | Admit: 2020-05-28 | Discharge: 2020-05-28 | Disposition: A | Discharge status: Home / Self Care | Payer: No Typology Code available for payment source | Source: Ambulatory Visit | Attending: Orthopedic Surgery | Admitting: Orthopedic Surgery

## 2020-05-28 ENCOUNTER — Other Ambulatory Visit: Payer: Self-pay

## 2020-05-28 ENCOUNTER — Encounter (HOSPITAL_COMMUNITY): Payer: Self-pay

## 2020-05-28 DIAGNOSIS — I10 Essential (primary) hypertension: Secondary | ICD-10-CM | POA: Diagnosis not present

## 2020-05-28 DIAGNOSIS — Z01818 Encounter for other preprocedural examination: Secondary | ICD-10-CM | POA: Diagnosis present

## 2020-05-28 LAB — PROTIME-INR
INR: 1 (ref 0.8–1.2)
Prothrombin Time: 13.1 seconds (ref 11.4–15.2)

## 2020-05-28 LAB — COMPREHENSIVE METABOLIC PANEL
ALT: 19 U/L (ref 0–44)
AST: 28 U/L (ref 15–41)
Albumin: 4.4 g/dL (ref 3.5–5.0)
Alkaline Phosphatase: 73 U/L (ref 38–126)
Anion gap: 10 (ref 5–15)
BUN: 9 mg/dL (ref 8–23)
CO2: 27 mmol/L (ref 22–32)
Calcium: 8.9 mg/dL (ref 8.9–10.3)
Chloride: 102 mmol/L (ref 98–111)
Creatinine, Ser: 1.16 mg/dL (ref 0.61–1.24)
GFR, Estimated: >60 mL/min (ref >60)
Glucose, Bld: 95 mg/dL (ref 70–99)
Potassium: 3.5 mmol/L (ref 3.5–5.1)
Sodium: 139 mmol/L (ref 135–145)
Total Bilirubin: 0.9 mg/dL (ref 0.3–1.2)
Total Protein: 7.3 g/dL (ref 6.5–8.1)

## 2020-05-28 LAB — CBC
HCT: 44.7 % (ref 39.0–52.0)
Hemoglobin: 15.6 g/dL (ref 13.0–17.0)
MCH: 30.6 pg (ref 26.0–34.0)
MCHC: 34.9 g/dL (ref 30.0–36.0)
MCV: 87.8 fL (ref 80.0–100.0)
Platelets: 178 10*3/uL (ref 150–400)
RBC: 5.09 MIL/uL (ref 4.22–5.81)
RDW: 14.1 % (ref 11.5–15.5)
WBC: 6.7 10*3/uL (ref 4.0–10.5)
nRBC: 0 % (ref 0.0–0.2)

## 2020-05-28 LAB — APTT: aPTT: 31 seconds (ref 24–36)

## 2020-05-28 LAB — SURGICAL PCR SCREEN
MRSA, PCR: NEGATIVE
Staphylococcus aureus: NEGATIVE

## 2020-05-29 NOTE — H&P (Signed)
TOTAL KNEE ADMISSION H&P  Patient is being admitted for left total knee arthroplasty.  Subjective:  Chief Complaint: Left knee pain.  HPI: Jeremiah Kelley, 74 y.o. male has a history of pain and functional disability in the left knee due to arthritis and has failed non-surgical conservative treatments for greater than 12 weeks to include NSAID's and/or analgesics, corticosteriod injections and activity modification. Onset of symptoms was gradual, starting several years ago with gradually worsening course since that time. The patient noted no past surgery on the left knee.  Patient currently rates pain in the left knee at 8 out of 10 with activity. Patient has worsening of pain with activity and weight bearing, pain that interferes with activities of daily living and crepitus. Patient has evidence of severe tricompartmental bone-on-bone disease with massive osteophyte formation by imaging studies. There is no active infection.  Patient Active Problem List   Diagnosis Date Noted  . OA (osteoarthritis) of hip 12/09/2016  . HLD (hyperlipidemia) 09/17/2016  . Essential hypertension 09/17/2016  . Stroke ([place]) 09/17/2016  . Chronic GERD 09/17/2016  . Osteoarthritis 09/17/2016  . Knee joint replacement status, right 09/17/2016  . Colon polyp 09/17/2016    Past Medical History:  Diagnosis Date  . Arthritis    osteoarthritis knees hips  . GERD (gastroesophageal reflux disease)   . Hyperlipidemia   . Hypertension   . Stroke [facility])     Past Surgical History:  Procedure Laterality Date  . JOINT REPLACEMENT     right knee  . KNEE SURGERY Right 2017  . TONSILLECTOMY     age 60  . TONSILLECTOMY    . TOTAL HIP ARTHROPLASTY Left 12/09/2016   Procedure: LEFT TOTAL HIP ARTHROPLASTY ANTERIOR APPROACH;  Surgeon: Gaynelle Arabian, MD;  Location: [facility];  Service: Orthopedics;  Laterality: Left;  . WRIST SURGERY Left     Prior to Admission medications   Medication Sig Start Date End Date Taking?  Authorizing Provider  amLODipine (NORVASC) 5 MG tablet Take 5 mg by mouth daily.    Yes [provider]  aspirin EC 81 MG tablet Take 81 mg by mouth daily.   Yes [provider]  Carboxymethylcellul-Glycerin (OPTIVE) 0.5-0.9 % SOLN Place 1 drop into both eyes in the morning and at bedtime.    Yes [provider]  celecoxib (CELEBREX) 100 MG capsule Take 100 mg by mouth daily as needed for moderate pain. 03/09/20  Yes [provider]  cholecalciferol (VITAMIN D3) 25 MCG (1000 UNIT) tablet Take 1,000 Units by mouth daily.   Yes [provider]  diclofenac Sodium (VOLTAREN) 1 % GEL Apply 2 g topically 2 (two) times daily as needed (knee pain).   Yes [provider]  hydrochlorothiazide (HYDRODIURIL) 25 MG tablet Take 25 mg by mouth daily.   Yes [provider]  pantoprazole (PROTONIX) 40 MG tablet Take 40 mg by mouth daily.   Yes [provider]  simvastatin (ZOCOR) 20 MG tablet Take 20 mg by mouth daily.   Yes [provider]  vitamin B-12 (CYANOCOBALAMIN) 500 MCG tablet Take 500 mcg by mouth daily.    Yes [provider]  vitamin E 180 MG (400 UNITS) capsule Take 400 Units by mouth daily.   Yes [provider]  methocarbamol (ROBAXIN) 500 MG tablet Take 1 tablet (500 mg total) by mouth every 6 (six) hours as needed for muscle spasms. Patient not taking: Reported on 05/21/2020 12/10/16   Dara Lords, Alexzandrew L, PA-C  oxyCODONE (OXY IR/ROXICODONE)  5 MG immediate release tablet Take 1-2 tablets (5-10 mg total) by mouth every 4 (four) hours as needed for moderate pain or severe pain. Patient not taking: Reported on 05/21/2020 12/10/16   Dara Lords, Alexzandrew L, PA-C  rivaroxaban (XARELTO) 10 MG TABS tablet Take 1 tablet (10 mg total) by mouth daily with breakfast. Take Xarelto for two and a half more weeks following discharge from the hospital, then discontinue Xarelto. Once the patient has completed the  Xarelto, they may resume the 81 mg Aspirin. Patient not taking: Reported on 05/21/2020 12/10/16   Dara Lords, Alexzandrew L, PA-C  traMADol (ULTRAM) 50 MG tablet Take 1-2 tablets (50-100 mg total) by mouth every 6 (six) hours as needed for moderate pain. Patient not taking: Reported on 05/21/2020 12/10/16   Dara Lords, Alexzandrew L, PA-C    No Known Allergies  Social History   Socioeconomic History  . Marital status: Married    Spouse name: Nevin Bloodgood  . Number of children: 2  . Years of education: 12  . Highest education level: Not on file  Occupational History  . Occupation: retired    Comment: [employer]    Comment: sherriff  Tobacco Use  . Smoking status: Former Smoker    Packs/day: 0.30    Types: Cigarettes    Start date: 06/29/2008  . Smokeless tobacco: Never Used  . Tobacco comment: stopped 3 months ago  Vaping Use  . Vaping Use: Never used  Substance and Sexual Activity  . Alcohol use: Yes    Comment: social drink occas  . Drug use: No  . Sexual activity: Not Currently  Other Topics Concern  . Not on file  Social History Narrative   Lives with wife paula   She is in a wheelchair   Both children are adults and live in [place]   Retired in [place] from [employer] Strain:   . Difficulty of Paying Living Expenses: Not on file  Food Insecurity:   . Worried About Charity fundraiser in the Last Year: Not on file  . Ran Out of Food in the Last Year: Not on file  Transportation Needs:   . Lack of Transportation (Medical): Not on file  . Lack of Transportation (Non-Medical): Not on file  Physical Activity:   . Days of Exercise per Week: Not on file  . Minutes of Exercise per Session: Not on file  Stress:   . Feeling of Stress : Not on file  Social Connections:   . Frequency of Communication with Friends and Family: Not on file  . Frequency of Social Gatherings with Friends and Family: Not on file  . Attends Religious Services: Not on  file  . Active Member of Clubs or Organizations: Not on file  . Attends Archivist Meetings: Not on file  . Marital Status: Not on file  Intimate Partner Violence:   . Fear of Current or Ex-Partner: Not on file  . Emotionally Abused: Not on file  . Physically Abused: Not on file  . Sexually Abused: Not on file      Tobacco Use: Medium Risk  . Smoking Tobacco Use: Former Smoker  . Smokeless Tobacco Use: Never Used   Social History   Substance and Sexual Activity  Alcohol Use Yes   Comment: social drink occas    Family History  Problem Relation Age of Onset  . Arthritis Mother   . Cancer Mother  breast  . Hypertension Mother   . Alzheimer's disease Mother   . Cirrhosis Maternal Grandmother     Review of Systems  Constitutional: Negative for chills and fever.  HENT: Negative for congestion, sore throat and tinnitus.   Eyes: Negative for double vision, photophobia and pain.  Respiratory: Negative for cough, shortness of breath and wheezing.   Cardiovascular: Negative for chest pain, palpitations and orthopnea.  Gastrointestinal: Negative for heartburn, nausea and vomiting.  Genitourinary: Negative for dysuria, frequency and urgency.  Musculoskeletal: Positive for joint pain.  Neurological: Negative for dizziness, weakness and headaches.    Objective:  Physical Exam: Well nourished and well developed.  General: Alert and oriented x3, cooperative and pleasant, no acute distress.  Head: normocephalic, atraumatic, neck supple.  Eyes: EOMI.  Respiratory: breath sounds clear in all fields, no wheezing, rales, or rhonchi. Cardiovascular: Regular rate and rhythm, no murmurs, gallops or rubs.  Abdomen: non-tender to palpation and soft, normoactive bowel sounds. Musculoskeletal:  Left Knee Exam:  No effusion.  Varus deformity.  Range of motion is 10-degree flexion contracture to 105 degrees of flexion.  Marked crepitus on range of motion of the knee.   Positive medial joint line tenderness.  No lateral joint line tenderness.  Stable knee.   Calves soft and nontender. Motor function intact in LE. Strength 5/5 LE bilaterally. Neuro: Distal pulses 2+. Sensation to light touch intact in LE.  Vital signs in last 24 hours: Temp:  [98.1 F (36.7 C)] 98.1 F (36.7 C) (11/30 1321) Pulse Rate:  [63] 63 (11/30 1321) Resp:  [16] 16 (11/30 1321) BP: (157)/(80) 157/80 (11/30 1321) SpO2:  [99 %] 99 % (11/30 1321) Weight:  [111.6 kg] 111.6 kg (11/30 1321)  Imaging Review Plain radiographs demonstrate severe degenerative joint disease of the left knee. The overall alignment is neutral. The bone quality appears to be adequate for age and reported activity level.  Assessment/Plan:  End stage arthritis, left knee   The patient history, physical examination, clinical judgment of the provider and imaging studies are consistent with end stage degenerative joint disease of the left knee and total knee arthroplasty is deemed medically necessary. The treatment options including medical management, injection therapy arthroscopy and arthroplasty were discussed at length. The risks and benefits of total knee arthroplasty were presented and reviewed. The risks due to aseptic loosening, infection, stiffness, patella tracking problems, thromboembolic complications and other imponderables were discussed. The patient acknowledged the explanation, agreed to proceed with the plan and consent was signed. Patient is being admitted for inpatient treatment for surgery, pain control, PT, OT, prophylactic antibiotics, VTE prophylaxis, progressive ambulation and ADLs and discharge planning. The patient is planning to be discharged home.   Patient's anticipated LOS is less than 2 midnights, meeting these requirements: - Younger than 101 - Lives within 1 hour of care - Has a competent adult at home to recover with post-op recover - NO history of  - Chronic pain requiring  opiods  - Diabetes  - Coronary Artery Disease  - Heart failure  - Heart attack  - Stroke  - DVT/VTE  - Cardiac arrhythmia  - Respiratory Failure/COPD  - Renal failure  - Anemia  - Advanced Liver disease   Therapy Plans: Outpatient therapy at [facility] Linna Hoff) Disposition: Home with daughter Planned DVT Prophylaxis: Aspirin 325 mg BID DME Needed: None PCP: Delphina Cahill, MD TXA: IV Allergies: NKDA Anesthesia Concerns: None BMI: 37 Last HgbA1c: Not diabetic Pharmacy: [facility])  Other: - History of  opioid induced constipation   - Patient was instructed on what medications to stop prior to surgery. - Follow-up visit in 2 weeks with Dr. Wynelle Link - Begin physical therapy following surgery - Pre-operative lab work as pre-surgical testing - Prescriptions will be provided in hospital at time of discharge  Theresa Duty, PA-C Orthopedic Surgery [facility] [place]

## 2020-05-30 ENCOUNTER — Other Ambulatory Visit (HOSPITAL_COMMUNITY)
Admission: RE | Admit: 2020-05-30 | Discharge: 2020-05-30 | Disposition: A | Discharge status: Home / Self Care | Payer: No Typology Code available for payment source | Source: Ambulatory Visit | Attending: Orthopedic Surgery | Admitting: Orthopedic Surgery

## 2020-05-30 DIAGNOSIS — Z01812 Encounter for preprocedural laboratory examination: Secondary | ICD-10-CM | POA: Diagnosis present

## 2020-05-30 DIAGNOSIS — Z20822 Contact with and (suspected) exposure to covid-19: Secondary | ICD-10-CM | POA: Insufficient documentation

## 2020-05-30 LAB — SARS CORONAVIRUS 2 (TAT 6-24 HRS): SARS Coronavirus 2: NEGATIVE

## 2020-06-02 MED ORDER — BUPIVACAINE LIPOSOME 1.3 % IJ SUSP
20.0000 mL | Freq: Once | INTRAMUSCULAR | Status: AC
  Filled 2020-06-02: qty 20

## 2020-06-02 NOTE — Anesthesia Preprocedure Evaluation (Addendum)
Anesthesia Evaluation  Patient identified by MRN, date of birth, ID band Patient awake    Reviewed: Allergy & Precautions, H&P , NPO status , Patient's Chart, lab work & pertinent test results  Airway Mallampati: II  TM Distance: >3 FB Neck ROM: Full    Dental no notable dental hx. (+) Partial Lower, Partial Upper, Dental Advisory Given   Pulmonary neg pulmonary ROS, Patient abstained from smoking., former smoker,    Pulmonary exam normal breath sounds clear to auscultation       Cardiovascular Exercise Tolerance: Good hypertension, Pt. on medications  Rhythm:Regular Rate:Normal     Neuro/Psych CVA negative psych ROS   GI/Hepatic Neg liver ROS, GERD  ,  Endo/Other  Morbid obesity  Renal/GU negative Renal ROS  negative genitourinary   Musculoskeletal  (+) Arthritis ,   Abdominal   Peds  Hematology negative hematology ROS (+)   Anesthesia Other Findings   Reproductive/Obstetrics negative OB ROS                            Anesthesia Physical Anesthesia Plan  ASA: III  Anesthesia Plan: Spinal and MAC   Post-op Pain Management:  Regional for Post-op pain   Induction: Intravenous  PONV Risk Score and Plan: 2 and Propofol infusion, Dexamethasone and Ondansetron  Airway Management Planned: Simple Face Mask  Additional Equipment:   Intra-op Plan:   Post-operative Plan:   Informed Consent: I have reviewed the patients History and Physical, chart, labs and discussed the procedure including the risks, benefits and alternatives for the proposed anesthesia with the patient or authorized representative who has indicated his/her understanding and acceptance.     Dental advisory given  Plan Discussed with: CRNA  Anesthesia Plan Comments:        Anesthesia Quick Evaluation

## 2020-06-03 ENCOUNTER — Observation Stay (HOSPITAL_COMMUNITY)
Admission: RE | Admit: 2020-06-03 | Discharge: 2020-06-05 | Disposition: A | Discharge status: Home / Self Care | Payer: No Typology Code available for payment source | Attending: Orthopedic Surgery | Admitting: Orthopedic Surgery

## 2020-06-03 ENCOUNTER — Encounter (HOSPITAL_COMMUNITY)
Admission: RE | Disposition: A | Discharge status: Home / Self Care | Payer: Self-pay | Source: Home / Self Care | Attending: Orthopedic Surgery

## 2020-06-03 ENCOUNTER — Ambulatory Visit (HOSPITAL_COMMUNITY): Payer: No Typology Code available for payment source | Admitting: Anesthesiology

## 2020-06-03 ENCOUNTER — Ambulatory Visit (HOSPITAL_COMMUNITY): Payer: No Typology Code available for payment source | Admitting: Physician Assistant

## 2020-06-03 ENCOUNTER — Encounter (HOSPITAL_COMMUNITY): Payer: Self-pay | Admitting: Orthopedic Surgery

## 2020-06-03 ENCOUNTER — Other Ambulatory Visit: Payer: Self-pay

## 2020-06-03 DIAGNOSIS — Z96642 Presence of left artificial hip joint: Secondary | ICD-10-CM | POA: Insufficient documentation

## 2020-06-03 DIAGNOSIS — M1712 Unilateral primary osteoarthritis, left knee: Principal | ICD-10-CM | POA: Insufficient documentation

## 2020-06-03 DIAGNOSIS — G8918 Other acute postprocedural pain: Secondary | ICD-10-CM | POA: Diagnosis not present

## 2020-06-03 DIAGNOSIS — M1710 Unilateral primary osteoarthritis, unspecified knee: Secondary | ICD-10-CM

## 2020-06-03 DIAGNOSIS — I10 Essential (primary) hypertension: Secondary | ICD-10-CM | POA: Insufficient documentation

## 2020-06-03 DIAGNOSIS — Z87891 Personal history of nicotine dependence: Secondary | ICD-10-CM | POA: Diagnosis not present

## 2020-06-03 DIAGNOSIS — Z79899 Other long term (current) drug therapy: Secondary | ICD-10-CM | POA: Insufficient documentation

## 2020-06-03 DIAGNOSIS — M179 Osteoarthritis of knee, unspecified: Secondary | ICD-10-CM | POA: Diagnosis present

## 2020-06-03 DIAGNOSIS — Z96651 Presence of right artificial knee joint: Secondary | ICD-10-CM | POA: Insufficient documentation

## 2020-06-03 DIAGNOSIS — Z7982 Long term (current) use of aspirin: Secondary | ICD-10-CM | POA: Diagnosis not present

## 2020-06-03 DIAGNOSIS — K219 Gastro-esophageal reflux disease without esophagitis: Secondary | ICD-10-CM | POA: Diagnosis not present

## 2020-06-03 HISTORY — PX: TOTAL KNEE ARTHROPLASTY: SHX125

## 2020-06-03 LAB — TYPE AND SCREEN
ABO/RH(D): O POS
Antibody Screen: NEGATIVE

## 2020-06-03 SURGERY — ARTHROPLASTY, KNEE, TOTAL
Anesthesia: Monitor Anesthesia Care | Site: Knee | Laterality: Left

## 2020-06-03 MED ORDER — ONDANSETRON HCL 4 MG/2ML IJ SOLN: 4.0000 mg | Freq: Four times a day (QID) | INTRAMUSCULAR | Status: DC | PRN

## 2020-06-03 MED ORDER — TRAMADOL HCL 50 MG PO TABS
50.0000 mg | ORAL_TABLET | Freq: Four times a day (QID) | ORAL | Status: DC | PRN
  Administered 2020-06-04 – 2020-06-05 (×3): 50 mg via ORAL
  Filled 2020-06-03 (×3): qty 1

## 2020-06-03 MED ORDER — STERILE WATER FOR IRRIGATION IR SOLN
Status: DC | PRN
  Administered 2020-06-03: 2000 mL

## 2020-06-03 MED ORDER — LIDOCAINE HCL (CARDIAC) PF 100 MG/5ML IV SOSY
PREFILLED_SYRINGE | INTRAVENOUS | Status: DC | PRN
  Administered 2020-06-03: 80 mg via INTRAVENOUS

## 2020-06-03 MED ORDER — FENTANYL CITRATE (PF) 100 MCG/2ML IJ SOLN
50.0000 ug | INTRAMUSCULAR | Status: DC
  Administered 2020-06-03: 50 ug via INTRAVENOUS
  Filled 2020-06-03: qty 2

## 2020-06-03 MED ORDER — DIPHENHYDRAMINE HCL 12.5 MG/5ML PO ELIX: 12.5000 mg | ORAL_SOLUTION | ORAL | Status: DC | PRN

## 2020-06-03 MED ORDER — MIDAZOLAM HCL 2 MG/2ML IJ SOLN
1.0000 mg | INTRAMUSCULAR | Status: DC
  Administered 2020-06-03: 1 mg via INTRAVENOUS
  Filled 2020-06-03: qty 2

## 2020-06-03 MED ORDER — AMLODIPINE BESYLATE 5 MG PO TABS
5.0000 mg | ORAL_TABLET | Freq: Every day | ORAL | Status: DC
  Administered 2020-06-05: 5 mg via ORAL
  Filled 2020-06-03 (×2): qty 1

## 2020-06-03 MED ORDER — LACTATED RINGERS IV SOLN: INTRAVENOUS | Status: DC

## 2020-06-03 MED ORDER — EPHEDRINE SULFATE-NACL 50-0.9 MG/10ML-% IV SOSY
PREFILLED_SYRINGE | INTRAVENOUS | Status: DC | PRN
  Administered 2020-06-03 (×3): 10 mg via INTRAVENOUS
  Administered 2020-06-03: 5 mg via INTRAVENOUS
  Administered 2020-06-03: 10 mg via INTRAVENOUS

## 2020-06-03 MED ORDER — ONDANSETRON HCL 4 MG PO TABS: 4.0000 mg | ORAL_TABLET | Freq: Four times a day (QID) | ORAL | Status: DC | PRN

## 2020-06-03 MED ORDER — SODIUM CHLORIDE (PF) 0.9 % IJ SOLN
INTRAMUSCULAR | Status: AC
  Filled 2020-06-03: qty 10

## 2020-06-03 MED ORDER — PHENOL 1.4 % MT LIQD: 1.0000 | OROMUCOSAL | Status: DC | PRN

## 2020-06-03 MED ORDER — PROPOFOL 500 MG/50ML IV EMUL
INTRAVENOUS | Status: DC | PRN
  Administered 2020-06-03: 100 ug/kg/min via INTRAVENOUS

## 2020-06-03 MED ORDER — LIDOCAINE HCL (PF) 2 % IJ SOLN
INTRAMUSCULAR | Status: AC
  Filled 2020-06-03: qty 5

## 2020-06-03 MED ORDER — ONDANSETRON HCL 4 MG/2ML IJ SOLN
INTRAMUSCULAR | Status: DC | PRN
  Administered 2020-06-03: 4 mg via INTRAVENOUS

## 2020-06-03 MED ORDER — MORPHINE SULFATE (PF) 2 MG/ML IV SOLN: 0.5000 mg | INTRAVENOUS | Status: DC | PRN

## 2020-06-03 MED ORDER — HYDROMORPHONE HCL 1 MG/ML IJ SOLN: 0.2500 mg | INTRAMUSCULAR | Status: DC | PRN

## 2020-06-03 MED ORDER — ASPIRIN EC 325 MG PO TBEC
325.0000 mg | DELAYED_RELEASE_TABLET | Freq: Two times a day (BID) | ORAL | Status: DC
  Administered 2020-06-04 – 2020-06-05 (×3): 325 mg via ORAL
  Filled 2020-06-03 (×3): qty 1

## 2020-06-03 MED ORDER — SODIUM CHLORIDE 0.9 % IV SOLN: INTRAVENOUS | Status: DC

## 2020-06-03 MED ORDER — BUPIVACAINE LIPOSOME 1.3 % IJ SUSP
INTRAMUSCULAR | Status: DC | PRN
  Administered 2020-06-03: 20 mL

## 2020-06-03 MED ORDER — DEXAMETHASONE SODIUM PHOSPHATE 10 MG/ML IJ SOLN
INTRAMUSCULAR | Status: AC
  Filled 2020-06-03: qty 1

## 2020-06-03 MED ORDER — METOCLOPRAMIDE HCL 5 MG PO TABS: 5.0000 mg | ORAL_TABLET | Freq: Three times a day (TID) | ORAL | Status: DC | PRN

## 2020-06-03 MED ORDER — DEXAMETHASONE SODIUM PHOSPHATE 10 MG/ML IJ SOLN: 8.0000 mg | Freq: Once | INTRAMUSCULAR | Status: AC

## 2020-06-03 MED ORDER — LACTATED RINGERS IV SOLN: INTRAVENOUS | Status: DC | PRN

## 2020-06-03 MED ORDER — MENTHOL 3 MG MT LOZG: 1.0000 | LOZENGE | OROMUCOSAL | Status: DC | PRN

## 2020-06-03 MED ORDER — DEXAMETHASONE SODIUM PHOSPHATE 10 MG/ML IJ SOLN
INTRAMUSCULAR | Status: DC | PRN
  Administered 2020-06-03: 10 mg via INTRAVENOUS

## 2020-06-03 MED ORDER — ONDANSETRON HCL 4 MG/2ML IJ SOLN
INTRAMUSCULAR | Status: AC
  Filled 2020-06-03: qty 2

## 2020-06-03 MED ORDER — SODIUM CHLORIDE (PF) 0.9 % IJ SOLN
INTRAMUSCULAR | Status: AC
  Filled 2020-06-03: qty 50

## 2020-06-03 MED ORDER — CEFAZOLIN SODIUM-DEXTROSE 2-4 GM/100ML-% IV SOLN
2.0000 g | INTRAVENOUS | Status: AC
  Administered 2020-06-03: 2 g via INTRAVENOUS
  Filled 2020-06-03: qty 100

## 2020-06-03 MED ORDER — OXYCODONE HCL 5 MG PO TABS
5.0000 mg | ORAL_TABLET | ORAL | Status: DC | PRN
  Administered 2020-06-04 (×2): 10 mg via ORAL
  Filled 2020-06-03 (×2): qty 2

## 2020-06-03 MED ORDER — TRANEXAMIC ACID-NACL 1000-0.7 MG/100ML-% IV SOLN
1000.0000 mg | INTRAVENOUS | Status: AC
  Administered 2020-06-03: 1000 mg via INTRAVENOUS
  Filled 2020-06-03: qty 100

## 2020-06-03 MED ORDER — PROPOFOL 1000 MG/100ML IV EMUL
INTRAVENOUS | Status: AC
  Filled 2020-06-03: qty 200

## 2020-06-03 MED ORDER — CEFAZOLIN SODIUM-DEXTROSE 2-4 GM/100ML-% IV SOLN
2.0000 g | Freq: Four times a day (QID) | INTRAVENOUS | Status: AC
  Administered 2020-06-03 (×2): 2 g via INTRAVENOUS
  Filled 2020-06-03 (×2): qty 100

## 2020-06-03 MED ORDER — SIMVASTATIN 20 MG PO TABS
20.0000 mg | ORAL_TABLET | Freq: Every day | ORAL | Status: DC
  Administered 2020-06-04 – 2020-06-05 (×2): 20 mg via ORAL
  Filled 2020-06-03 (×2): qty 1

## 2020-06-03 MED ORDER — POLYVINYL ALCOHOL 1.4 % OP SOLN
1.0000 [drp] | OPHTHALMIC | Status: DC | PRN
  Filled 2020-06-03: qty 15

## 2020-06-03 MED ORDER — 0.9 % SODIUM CHLORIDE (POUR BTL) OPTIME
TOPICAL | Status: DC | PRN
  Administered 2020-06-03: 1000 mL

## 2020-06-03 MED ORDER — METOCLOPRAMIDE HCL 5 MG/ML IJ SOLN: 5.0000 mg | Freq: Three times a day (TID) | INTRAMUSCULAR | Status: DC | PRN

## 2020-06-03 MED ORDER — PHENYLEPHRINE HCL (PRESSORS) 10 MG/ML IV SOLN
INTRAVENOUS | Status: AC
  Filled 2020-06-03: qty 1

## 2020-06-03 MED ORDER — BISACODYL 10 MG RE SUPP: 10.0000 mg | Freq: Every day | RECTAL | Status: DC | PRN

## 2020-06-03 MED ORDER — DOCUSATE SODIUM 100 MG PO CAPS
100.0000 mg | ORAL_CAPSULE | Freq: Two times a day (BID) | ORAL | Status: DC
  Administered 2020-06-03 – 2020-06-05 (×4): 100 mg via ORAL
  Filled 2020-06-03 (×4): qty 1

## 2020-06-03 MED ORDER — HYDROCHLOROTHIAZIDE 25 MG PO TABS
25.0000 mg | ORAL_TABLET | Freq: Every day | ORAL | Status: DC
  Administered 2020-06-05: 25 mg via ORAL
  Filled 2020-06-03 (×2): qty 1

## 2020-06-03 MED ORDER — EPHEDRINE 5 MG/ML INJ
INTRAVENOUS | Status: AC
  Filled 2020-06-03: qty 10

## 2020-06-03 MED ORDER — DEXAMETHASONE SODIUM PHOSPHATE 10 MG/ML IJ SOLN
10.0000 mg | Freq: Once | INTRAMUSCULAR | Status: AC
  Administered 2020-06-04: 10 mg via INTRAVENOUS
  Filled 2020-06-03: qty 1

## 2020-06-03 MED ORDER — POLYETHYLENE GLYCOL 3350 17 G PO PACK: 17.0000 g | PACK | Freq: Every day | ORAL | Status: DC | PRN

## 2020-06-03 MED ORDER — POVIDONE-IODINE 10 % EX SWAB
2.0000 "application " | Freq: Once | CUTANEOUS | Status: AC
  Administered 2020-06-03: 2 via TOPICAL

## 2020-06-03 MED ORDER — ACETAMINOPHEN 10 MG/ML IV SOLN
1000.0000 mg | Freq: Four times a day (QID) | INTRAVENOUS | Status: DC
  Administered 2020-06-03: 1000 mg via INTRAVENOUS
  Filled 2020-06-03: qty 100

## 2020-06-03 MED ORDER — CHLORHEXIDINE GLUCONATE 0.12 % MT SOLN
15.0000 mL | Freq: Once | OROMUCOSAL | Status: AC
  Administered 2020-06-03: 15 mL via OROMUCOSAL

## 2020-06-03 MED ORDER — SODIUM CHLORIDE (PF) 0.9 % IJ SOLN
INTRAMUSCULAR | Status: DC | PRN
  Administered 2020-06-03: 60 mL

## 2020-06-03 MED ORDER — PROPOFOL 10 MG/ML IV BOLUS
INTRAVENOUS | Status: DC | PRN
  Administered 2020-06-03: 20 mg via INTRAVENOUS
  Administered 2020-06-03: 30 mg via INTRAVENOUS

## 2020-06-03 MED ORDER — METHOCARBAMOL 500 MG IVPB - SIMPLE MED
500.0000 mg | Freq: Four times a day (QID) | INTRAVENOUS | Status: DC | PRN
  Filled 2020-06-03: qty 50

## 2020-06-03 MED ORDER — SODIUM CHLORIDE 0.9 % IR SOLN
Status: DC | PRN
  Administered 2020-06-03: 1000 mL

## 2020-06-03 MED ORDER — PANTOPRAZOLE SODIUM 40 MG PO TBEC
40.0000 mg | DELAYED_RELEASE_TABLET | Freq: Every day | ORAL | Status: DC
  Administered 2020-06-03 – 2020-06-05 (×3): 40 mg via ORAL
  Filled 2020-06-03 (×3): qty 1

## 2020-06-03 MED ORDER — BUPIVACAINE IN DEXTROSE 0.75-8.25 % IT SOLN
INTRATHECAL | Status: DC | PRN
  Administered 2020-06-03: 1.6 mL via INTRATHECAL

## 2020-06-03 MED ORDER — ORAL CARE MOUTH RINSE: 15.0000 mL | Freq: Once | OROMUCOSAL | Status: AC

## 2020-06-03 MED ORDER — FLEET ENEMA 7-19 GM/118ML RE ENEM: 1.0000 | ENEMA | Freq: Once | RECTAL | Status: DC | PRN

## 2020-06-03 MED ORDER — BUPIVACAINE-EPINEPHRINE (PF) 0.5% -1:200000 IJ SOLN
INTRAMUSCULAR | Status: DC | PRN
  Administered 2020-06-03: 20 mL via PERINEURAL

## 2020-06-03 MED ORDER — GABAPENTIN 300 MG PO CAPS
300.0000 mg | ORAL_CAPSULE | Freq: Three times a day (TID) | ORAL | Status: DC
  Administered 2020-06-03 – 2020-06-05 (×6): 300 mg via ORAL
  Filled 2020-06-03 (×6): qty 1

## 2020-06-03 MED ORDER — METHOCARBAMOL 500 MG PO TABS
500.0000 mg | ORAL_TABLET | Freq: Four times a day (QID) | ORAL | Status: DC | PRN
  Administered 2020-06-04 – 2020-06-05 (×3): 500 mg via ORAL
  Filled 2020-06-03 (×3): qty 1

## 2020-06-03 MED ORDER — ACETAMINOPHEN 325 MG PO TABS
325.0000 mg | ORAL_TABLET | Freq: Four times a day (QID) | ORAL | Status: DC | PRN
  Administered 2020-06-03: 325 mg via ORAL
  Administered 2020-06-04 – 2020-06-05 (×3): 650 mg via ORAL
  Filled 2020-06-03: qty 1
  Filled 2020-06-03 (×3): qty 2

## 2020-06-03 SURGICAL SUPPLY — 59 items
ATTUNE MED DOME PAT 41 KNEE (Knees) ×2 IMPLANT
ATTUNE MED DOME PAT 41MM KNEE (Knees) ×1 IMPLANT
ATTUNE PS FEM LT SZ 8 CEM KNEE (Femur) ×3 IMPLANT
ATTUNE PSRP INSR SZ8 12 KNEE (Insert) ×2 IMPLANT
ATTUNE PSRP INSR SZ8 12MMKNEE (Insert) ×1 IMPLANT
BAG ZIPLOCK 12X15 (MISCELLANEOUS) ×3 IMPLANT
BASE TIBIAL ROT PLAT SZ 8 KNEE (Knees) ×1 IMPLANT
BLADE SAG 18X100X1.27 (BLADE) ×3 IMPLANT
BLADE SAW SGTL 11.0X1.19X90.0M (BLADE) ×3 IMPLANT
BLADE SURG SZ10 CARB STEEL (BLADE) ×6 IMPLANT
BNDG ELASTIC 6X5.8 VLCR STR LF (GAUZE/BANDAGES/DRESSINGS) ×3 IMPLANT
BOWL SMART MIX CTS (DISPOSABLE) ×3 IMPLANT
CEMENT HV SMART SET (Cement) ×6 IMPLANT
CLOSURE WOUND 1/2 X4 (GAUZE/BANDAGES/DRESSINGS) ×2
COVER SURGICAL LIGHT HANDLE (MISCELLANEOUS) ×3 IMPLANT
COVER WAND RF STERILE (DRAPES) IMPLANT
CUFF TOURN SGL QUICK 34 (TOURNIQUET CUFF) ×2
CUFF TRNQT CYL 34X4.125X (TOURNIQUET CUFF) ×1 IMPLANT
DECANTER SPIKE VIAL GLASS SM (MISCELLANEOUS) ×3 IMPLANT
DRAPE U-SHAPE 47X51 STRL (DRAPES) ×3 IMPLANT
DRESSING AQUACEL AG SP 3.5X10 (GAUZE/BANDAGES/DRESSINGS) ×1 IMPLANT
DRSG AQUACEL AG ADV 3.5X10 (GAUZE/BANDAGES/DRESSINGS) ×3 IMPLANT
DRSG AQUACEL AG SP 3.5X10 (GAUZE/BANDAGES/DRESSINGS) ×3
DURAPREP 26ML APPLICATOR (WOUND CARE) ×3 IMPLANT
ELECT REM PT RETURN 15FT ADLT (MISCELLANEOUS) ×3 IMPLANT
GLOVE BIO SURGEON STRL SZ7 (GLOVE) ×3 IMPLANT
GLOVE BIO SURGEON STRL SZ8 (GLOVE) ×3 IMPLANT
GLOVE BIOGEL PI IND STRL 7.0 (GLOVE) ×1 IMPLANT
GLOVE BIOGEL PI IND STRL 8 (GLOVE) ×1 IMPLANT
GLOVE BIOGEL PI INDICATOR 7.0 (GLOVE) ×2
GLOVE BIOGEL PI INDICATOR 8 (GLOVE) ×2
GOWN STRL REUS W/TWL LRG LVL3 (GOWN DISPOSABLE) ×6 IMPLANT
HANDPIECE INTERPULSE COAX TIP (DISPOSABLE) ×2
HOLDER FOLEY CATH W/STRAP (MISCELLANEOUS) ×3 IMPLANT
IMMOBILIZER KNEE 20 (SOFTGOODS) ×3
IMMOBILIZER KNEE 20 THIGH 36 (SOFTGOODS) ×1 IMPLANT
KIT TURNOVER KIT A (KITS) IMPLANT
MANIFOLD NEPTUNE II (INSTRUMENTS) ×3 IMPLANT
NS IRRIG 1000ML POUR BTL (IV SOLUTION) ×3 IMPLANT
PACK TOTAL KNEE CUSTOM (KITS) ×3 IMPLANT
PADDING CAST COTTON 6X4 STRL (CAST SUPPLIES) ×3 IMPLANT
PENCIL SMOKE EVACUATOR (MISCELLANEOUS) ×3 IMPLANT
PIN DRILL FIX HALF THREAD (BIT) ×3 IMPLANT
PIN STEINMAN FIXATION KNEE (PIN) ×3 IMPLANT
PROTECTOR NERVE ULNAR (MISCELLANEOUS) ×3 IMPLANT
SET HNDPC FAN SPRY TIP SCT (DISPOSABLE) ×1 IMPLANT
STRIP CLOSURE SKIN 1/2X4 (GAUZE/BANDAGES/DRESSINGS) ×4 IMPLANT
SUT MNCRL AB 4-0 PS2 18 (SUTURE) ×3 IMPLANT
SUT STRATAFIX 0 PDS 27 VIOLET (SUTURE) ×3
SUT VIC AB 2-0 CT1 27 (SUTURE) ×8
SUT VIC AB 2-0 CT1 TAPERPNT 27 (SUTURE) ×4 IMPLANT
SUTURE STRATFX 0 PDS 27 VIOLET (SUTURE) ×1 IMPLANT
TIBIAL BASE ROT PLAT SZ 8 KNEE (Knees) ×3 IMPLANT
TRAY FOLEY MTR SLVR 16FR STAT (SET/KITS/TRAYS/PACK) ×3 IMPLANT
TUBING CONNECTING 10 (TUBING) ×2 IMPLANT
TUBING CONNECTING 10' (TUBING) ×1
WATER STERILE IRR 1000ML POUR (IV SOLUTION) ×6 IMPLANT
WRAP KNEE MAXI GEL POST OP (GAUZE/BANDAGES/DRESSINGS) ×3 IMPLANT
YANKAUER SUCT BULB TIP NO VENT (SUCTIONS) ×3 IMPLANT

## 2020-06-03 NOTE — Evaluation (Signed)
Physical Therapy Evaluation Patient Details Name: Jeremiah Kelley MRN: [id number] DOB: April 05, 1946 Today's Date: 06/03/2020   History of Present Illness  Pt s/p L TKR and with hx fo L THR, R TKR, CVA and L wrist fusioni  Clinical Impression  Pt s/p L TKR and presents with decreased L LE strength/ROM and post op pain limiting functional mobility.  Pt should progress to dc home with assist of dtr.  Pt reports first OP PT appt will be Thursday 06/06/20.    Follow Up Recommendations Follow surgeon's recommendation for DC plan and follow-up therapies    Equipment Recommendations  Rolling walker with 5" wheels    Recommendations for Other Services       Precautions / Restrictions Precautions Precautions: Fall;Knee Restrictions Weight Bearing Restrictions: No Other Position/Activity Restrictions: WBAT      Mobility  Bed Mobility Overal bed mobility: Needs Assistance Bed Mobility: Supine to Sit     Supine to sit: Supervision     General bed mobility comments: for safety    Transfers Overall transfer level: Needs assistance Equipment used: Rolling walker (2 wheeled) Transfers: Sit to/from Stand Sit to Stand: Min assist;From elevated surface         General transfer comment: cues for LE management and use of UEs to self assist;  min assist for balance  Ambulation/Gait Ambulation/Gait assistance: Min assist Gait Distance (Feet): 50 Feet Assistive device: Rolling walker (2 wheeled) Gait Pattern/deviations: Step-to pattern;Decreased step length - right;Decreased step length - left;Shuffle;Wide base of support Gait velocity: decr   General Gait Details: cues for sequence, posture and position from RW.  Stairs            Wheelchair Mobility    Modified Rankin (Stroke Patients Only)       Balance Overall balance assessment: Needs assistance Sitting-balance support: Feet supported;No upper extremity supported Sitting balance-Leahy Scale: Fair     Standing  balance support: Bilateral upper extremity supported Standing balance-Leahy Scale: Poor Standing balance comment: posterior drift                             Pertinent Vitals/Pain Pain Assessment: No/denies pain    Home Living Family/patient expects to be discharged to:: Private residence Living Arrangements: Spouse/significant other Available Help at Discharge: Family;Available 24 hours/day Type of Home: House Home Access: Ramped entrance     Home Layout: One level Home Equipment: Walker - 2 wheels;Bedside commode Additional Comments: 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 Dtr will be assisting    Prior Function Level of Independence: Independent               Hand Dominance        Extremity/Trunk Assessment   Upper Extremity Assessment Upper Extremity Assessment: Overall WFL for tasks assessed;LUE deficits/detail LUE Deficits / Details: wrist fusion    Lower Extremity Assessment Lower Extremity Assessment: LLE deficits/detail LLE Deficits / Details: IND SLR; AROM flex at knee to 80 in sitting       Communication   Communication: No difficulties  Cognition Arousal/Alertness: Awake/alert Behavior During Therapy: WFL for tasks assessed/performed Overall Cognitive Status: Within Functional Limits for tasks assessed                                        General Comments      Exercises Total Joint Exercises Ankle Circles/Pumps:  AROM;Both;15 reps;Supine   Assessment/Plan    PT Assessment Patient needs continued PT services  PT Problem List Decreased strength;Decreased range of motion;Decreased activity tolerance;Decreased balance;Decreased mobility;Decreased knowledge of use of DME;Obesity;Pain       PT Treatment Interventions DME instruction;Gait training;Stair training;Functional mobility training;Therapeutic activities;Therapeutic exercise;Patient/family education    PT Goals (Current goals can be found in the  Care Plan section)  Acute Rehab PT Goals Patient Stated Goal: Regain IND to be able to care for his disabled spouse PT Goal Formulation: With patient Time For Goal Achievement: 06/10/20 Potential to Achieve Goals: Good    Frequency 7X/week   Barriers to discharge        Co-evaluation               AM-PAC PT "6 Clicks" Mobility  Outcome Measure Help needed turning from your back to your side while in a flat bed without using bedrails?: A Little Help needed moving from lying on your back to sitting on the side of a flat bed without using bedrails?: A Little Help needed moving to and from a bed to a chair (including a wheelchair)?: A Little Help needed standing up from a chair using your arms (e.g., wheelchair or bedside chair)?: A Little Help needed to walk in hospital room?: A Little Help needed climbing 3-5 steps with a railing? : A Lot 6 Click Score: 17    End of Session Equipment Utilized During Treatment: Gait belt Activity Tolerance: Patient tolerated treatment well Patient left: in chair;with call bell/phone within reach;with chair alarm set Nurse Communication: Mobility status PT Visit Diagnosis: Difficulty in walking, not elsewhere classified (R26.2)    Time: 6945-0388 PT Time Calculation (min) (ACUTE ONLY): 33 min   Charges:   PT Evaluation $PT Eval Low Complexity: 1 Low PT Treatments $Gait Training: 8-22 mins        [place] Pager [phone] Office [phone]   Madline Oesterling 06/03/2020, 5:56 PM

## 2020-06-03 NOTE — Anesthesia Procedure Notes (Signed)
Anesthesia Regional Block: Adductor canal block   Pre-Anesthetic Checklist: ,, timeout performed, Correct Patient, Correct Site, Correct Laterality, Correct Procedure, Correct Position, site marked, Risks and benefits discussed, pre-op evaluation,  At surgeon's request and post-op pain management  Laterality: Left  Prep: Maximum Sterile Barrier Precautions used, chloraprep       Needles:  Injection technique: Single-shot  Needle Type: Echogenic Stimulator Needle     Needle Length: 9cm  Needle Gauge: 21     Additional Needles:   Procedures:,,,, ultrasound used (permanent image in chart),,,,  Narrative:  Start time: 06/03/2020 7:39 AM End time: 06/03/2020 7:49 AM Injection made incrementally with aspirations every 5 mL.  Performed by: Personally  Anesthesiologist: Roderic Palau, MD  Additional Notes: 2% Lidocaine skin wheel.

## 2020-06-03 NOTE — Anesthesia Postprocedure Evaluation (Signed)
Anesthesia Post Note  Patient: Jeremiah Kelley  Procedure(s) Performed: TOTAL KNEE ARTHROPLASTY (Left Knee)     Patient location during evaluation: PACU Anesthesia Type: MAC, Spinal and Regional Level of consciousness: oriented and awake and alert Pain management: pain level controlled Vital Signs Assessment: post-procedure vital signs reviewed and stable Respiratory status: spontaneous breathing, respiratory function stable and patient connected to nasal cannula oxygen Cardiovascular status: blood pressure returned to baseline and stable Postop Assessment: no headache, no backache, no apparent nausea or vomiting, spinal receding and patient able to bend at knees Anesthetic complications: no   No complications documented.  Last Vitals:  Vitals:   06/03/20 1124 06/03/20 1224  BP: (!) 146/89 126/72  Pulse: (!) 55 63  Resp: 15 16  Temp:    SpO2: 100% 100%    Last Pain:  Vitals:   06/03/20 1128  TempSrc:   PainSc: 0-No pain                 Claudio Mondry,W. EDMOND

## 2020-06-03 NOTE — Transfer of Care (Signed)
Immediate Anesthesia Transfer of Care Note  Patient: Jeremiah Kelley  Procedure(s) Performed: TOTAL KNEE ARTHROPLASTY (Left Knee)  Patient Location: PACU  Anesthesia Type:Spinal  Level of Consciousness: awake, alert , oriented and patient cooperative  Airway & Oxygen Therapy: Patient Spontanous Breathing and Patient connected to face mask oxygen  Post-op Assessment: Report given to RN and Post -op Vital signs reviewed and stable  Post vital signs: Reviewed and stable  Last Vitals:  Vitals Value Taken Time  BP    Temp    Pulse 73 06/03/20 1021  Resp 12 06/03/20 1021  SpO2 100 % 06/03/20 1021  Vitals shown include unvalidated device data.  Last Pain:  Vitals:   06/03/20 0615  TempSrc:   PainSc: 7       Patients Stated Pain Goal: 4 (12/75/17 0017)  Complications: No complications documented.

## 2020-06-03 NOTE — Progress Notes (Signed)
Assisted Dr. Edmond Fitzgerald with left, ultrasound guided, adductor canal block. Side rails up, monitors on throughout procedure. See vital signs in flow sheet. Tolerated Procedure well. 

## 2020-06-03 NOTE — Anesthesia Procedure Notes (Signed)
Spinal  Patient location during procedure: OR End time: 06/03/2020 8:45 AM Staffing Performed: resident/CRNA  Resident/CRNA: Caryl Pina T, CRNA Preanesthetic Checklist Completed: patient identified, IV checked, site marked, risks and benefits discussed, surgical consent, monitors and equipment checked, pre-op evaluation and timeout performed Spinal Block Patient position: sitting Prep: DuraPrep Patient monitoring: heart rate, cardiac monitor, continuous pulse ox and blood pressure Approach: midline Location: L3-4 Injection technique: single-shot Needle Needle type: Sprotte and Pencan  Needle gauge: 24 G Needle length: 9 cm Assessment Sensory level: T4 Additional Notes IV functioning, monitors applied to pt. Expiration date of kit checked and confirmed to be in date. Sterile prep and drape, hand hygiene and sterile gloved used. Pt was positioned and spine was prepped in sterile fashion. Skin was anesthetized with lidocaine. Free flow of clear CSF obtained prior to injecting local anesthetic into CSF x 1 attempt. Spinal needle aspirated freely following injection. Needle was carefully withdrawn, and pt tolerated procedure well. Loss of motor and sensory on exam post injection.

## 2020-06-03 NOTE — Progress Notes (Signed)
Orthopedic Tech Progress Note Patient Details:  Jeremiah Kelley 1945-09-06 [id number]  CPM Left Knee CPM Left Knee: On Left Knee Flexion (Degrees): 40 Left Knee Extension (Degrees): 10  Post Interventions Patient Tolerated: Well Instructions Provided: Care of device     Post Interventions Patient Tolerated: Well Instructions Provided: Care of device   Braulio Bosch 06/03/2020, 10:50 AM

## 2020-06-03 NOTE — Progress Notes (Signed)
Orthopedic Tech Progress Note Patient Details:  Jeremiah Kelley 1946/01/30 [id number] Patient already has knee immobilizer. Patient ID: Jeremiah Kelley, male   DOB: April 16, 1946, 74 y.o.   MRN: [id number]   Braulio Bosch 06/03/2020, 11:54 AM

## 2020-06-03 NOTE — Discharge Instructions (Signed)
 Frank [name], MD Total Joint Specialist [facility] [place] [address] , [place] [address] [phone]  TOTAL KNEE REPLACEMENT POSTOPERATIVE DIRECTIONS    Knee Rehabilitation, Guidelines Following Surgery  Results after knee surgery are often greatly improved when you follow the exercise, range of motion and muscle strengthening exercises prescribed by your doctor. Safety measures are also important to protect the knee from further injury. If any of these exercises cause you to have increased pain or swelling in your knee joint, decrease the amount until you are comfortable again and slowly increase them. If you have problems or questions, call your caregiver or physical therapist for advice.   BLOOD CLOT PREVENTION . Take a 325 mg Aspirin two times a day for three weeks following surgery. Then resume one 81 mg Aspirin once a day. . You may resume your vitamins/supplements upon discharge from the hospital. . Do not take any NSAIDs (Advil, Aleve, Ibuprofen, Meloxicam, etc.) until you have discontinued the 325 mg Aspirin.  HOME CARE INSTRUCTIONS  . Remove items at home which could result in a fall. This includes throw rugs or furniture in walking pathways.  . ICE to the affected knee as much as tolerated. Icing helps control swelling. If the swelling is well controlled you will be more comfortable and rehab easier. Continue to use ice on the knee for pain and swelling from surgery. You may notice swelling that will progress down to the foot and ankle. This is normal after surgery. Elevate the leg when you are not up walking on it.    . Continue to use the breathing machine which will help keep your temperature down. It is common for your temperature to cycle up and down following surgery, especially at night when you are not up moving around and exerting yourself. The breathing machine keeps your lungs expanded and your temperature down. . Do not place pillow  under the operative knee, focus on keeping the knee straight while resting  DIET You may resume your previous home diet once you are discharged from the hospital.  DRESSING / WOUND CARE / SHOWERING . Keep your bulky bandage on for 2 days. On the third post-operative day you may remove the Ace bandage and gauze. There is a waterproof adhesive bandage on your skin which will stay in place until your first follow-up appointment. Once you remove this you will not need to place another bandage . You may begin showering 3 days following surgery, but do not submerge the incision under water.  ACTIVITY For the first 5 days, the key is rest and control of pain and swelling . Do your home exercises twice a day starting on post-operative day 3. On the days you go to physical therapy, just do the home exercises once that day. . You should rest, ice and elevate the leg for 50 minutes out of every hour. Get up and walk/stretch for 10 minutes per hour. After 5 days you can increase your activity slowly as tolerated. . Walk with your walker as instructed. Use the walker until you are comfortable transitioning to a cane. Walk with the cane in the opposite hand of the operative leg. You may discontinue the cane once you are comfortable and walking steadily. . Avoid periods of inactivity such as sitting longer than an hour when not asleep. This helps prevent blood clots.  . You may discontinue the knee immobilizer once you are able to perform a straight leg raise while lying down. .   You may resume a sexual relationship in one month or when given the OK by your doctor.  . You may return to work once you are cleared by your doctor.  . Do not drive a car for 6 weeks or until released by your surgeon.  . Do not drive while taking narcotics.  TED HOSE STOCKINGS Wear the elastic stockings on both legs for three weeks following surgery during the day. You may remove them at night for sleeping.  WEIGHT BEARING Weight  bearing as tolerated with assist device (walker, cane, etc) as directed, use it as long as suggested by your surgeon or therapist, typically at least 4-6 weeks.  POSTOPERATIVE CONSTIPATION PROTOCOL Constipation - defined medically as fewer than three stools per week and severe constipation as less than one stool per week.  One of the most common issues patients have following surgery is constipation.  Even if you have a regular bowel pattern at home, your normal regimen is likely to be disrupted due to multiple reasons following surgery.  Combination of anesthesia, postoperative narcotics, change in appetite and fluid intake all can affect your bowels.  In order to avoid complications following surgery, here are some recommendations in order to help you during your recovery period.  . Colace (docusate) - Pick up an over-the-counter form of Colace or another stool softener and take twice a day as long as you are requiring postoperative pain medications.  Take with a full glass of water daily.  If you experience loose stools or diarrhea, hold the colace until you stool forms back up. If your symptoms do not get better within 1 week or if they get worse, check with your doctor. . Dulcolax (bisacodyl) - Pick up over-the-counter and take as directed by the product packaging as needed to assist with the movement of your bowels.  Take with a full glass of water.  Use this product as needed if not relieved by Colace only.  . MiraLax (polyethylene glycol) - Pick up over-the-counter to have on hand. MiraLax is a solution that will increase the amount of water in your bowels to assist with bowel movements.  Take as directed and can mix with a glass of water, juice, soda, coffee, or tea. Take if you go more than two days without a movement. Do not use MiraLax more than once per day. Call your doctor if you are still constipated or irregular after using this medication for 7 days in a row.  If you continue to have  problems with postoperative constipation, please contact the office for further assistance and recommendations.  If you experience "the worst abdominal pain ever" or develop nausea or vomiting, please contact the office immediatly for further recommendations for treatment.  ITCHING If you experience itching with your medications, try taking only a single pain pill, or even half a pain pill at a time.  You can also use Benadryl over the counter for itching or also to help with sleep.   MEDICATIONS See your medication summary on the "After Visit Summary" that the nursing staff will review with you prior to discharge.  You may have some home medications which will be placed on hold until you complete the course of blood thinner medication.  It is important for you to complete the blood thinner medication as prescribed by your surgeon.  Continue your approved medications as instructed at time of discharge.  PRECAUTIONS . If you experience chest pain or shortness of breath - call 911 immediately for   transfer to the hospital emergency department.  . If you develop a fever greater that 101 F, purulent drainage from wound, increased redness or drainage from wound, foul odor from the wound/dressing, or calf pain - CONTACT YOUR SURGEON.                                                   FOLLOW-UP APPOINTMENTS Make sure you keep all of your appointments after your operation with your surgeon and caregivers. You should call the office at the above phone number and make an appointment for approximately two weeks after the date of your surgery or on the date instructed by your surgeon outlined in the "After Visit Summary".  RANGE OF MOTION AND STRENGTHENING EXERCISES  Rehabilitation of the knee is important following a knee injury or an operation. After just a few days of immobilization, the muscles of the thigh which control the knee become weakened and shrink (atrophy). Knee exercises are designed to build up the  tone and strength of the thigh muscles and to improve knee motion. Often times heat used for twenty to thirty minutes before working out will loosen up your tissues and help with improving the range of motion but do not use heat for the first two weeks following surgery. These exercises can be done on a training (exercise) mat, on the floor, on a table or on a bed. Use what ever works the best and is most comfortable for you Knee exercises include:  . Leg Lifts - While your knee is still immobilized in a splint or cast, you can do straight leg raises. Lift the leg to 60 degrees, hold for 3 sec, and slowly lower the leg. Repeat 10-20 times 2-3 times daily. Perform this exercise against resistance later as your knee gets better.  . Quad and Hamstring Sets - Tighten up the muscle on the front of the thigh (Quad) and hold for 5-10 sec. Repeat this 10-20 times hourly. Hamstring sets are done by pushing the foot backward against an object and holding for 5-10 sec. Repeat as with quad sets.   Leg Slides: Lying on your back, slowly slide your foot toward your buttocks, bending your knee up off the floor (only go as far as is comfortable). Then slowly slide your foot back down until your leg is flat on the floor again.  Angel Wings: Lying on your back spread your legs to the side as far apart as you can without causing discomfort.  A rehabilitation program following serious knee injuries can speed recovery and prevent re-injury in the future due to weakened muscles. Contact your doctor or a physical therapist for more information on knee rehabilitation.   IF YOU ARE TRANSFERRED TO A SKILLED REHAB FACILITY If the patient is transferred to a skilled rehab facility following release from the hospital, a list of the current medications will be sent to the facility for the patient to continue.  When discharged from the skilled rehab facility, please have the facility set up the patient's Home Health Physical Therapy  prior to being released. Also, the skilled facility will be responsible for providing the patient with their medications at time of release from the facility to include their pain medication, the muscle relaxants, and their blood thinner medication. If the patient is still at the rehab facility at time of the   two week follow up appointment, the skilled rehab facility will also need to assist the patient in arranging follow up appointment in our office and any transportation needs.  MAKE SURE YOU:  . Understand these instructions.  . Get help right away if you are not doing well or get worse.   DENTAL ANTIBIOTICS:  In most cases prophylactic antibiotics for Dental procdeures after total joint surgery are not necessary.  Exceptions are as follows:  1. History of prior total joint infection  2. Severely immunocompromised (Organ Transplant, cancer chemotherapy, Rheumatoid biologic meds such as Humera)  3. Poorly controlled diabetes (A1C &gt; 8.0, blood glucose over 200)  If you have one of these conditions, contact your surgeon for an antibiotic prescription, prior to your dental procedure.    Pick up stool softner and laxative for home use following surgery while on pain medications. Do not submerge incision under water. Please use good hand washing techniques while changing dressing each day. May shower starting three days after surgery. Please use a clean towel to pat the incision dry following showers. Continue to use ice for pain and swelling after surgery. Do not use any lotions or creams on the incision until instructed by your surgeon.  

## 2020-06-03 NOTE — Interval H&P Note (Signed)
History and Physical Interval Note:  06/03/2020 6:34 AM  Jeremiah Kelley  has presented today for surgery, with the diagnosis of left knee osteoarthritis.  The various methods of treatment have been discussed with the patient and family. After consideration of risks, benefits and other options for treatment, the patient has consented to  Procedure(s) with comments: TOTAL KNEE ARTHROPLASTY (Left) - 27min as a surgical intervention.  The patient's history has been reviewed, patient examined, no change in status, stable for surgery.  I have reviewed the patient's chart and labs.  Questions were answered to the patient's satisfaction.     Pilar Plate Sabri Teal

## 2020-06-03 NOTE — Op Note (Signed)
OPERATIVE REPORT-TOTAL KNEE ARTHROPLASTY   Pre-operative diagnosis- Osteoarthritis  Left knee(s)  Post-operative diagnosis- Osteoarthritis Left knee(s)  Procedure-  Left  Total Knee Arthroplasty  Surgeon- [name]. Ellanore Vanhook, MD  Assistant- Molli Barrows, PA-C   Anesthesia-  Adductor canal block and spinal  EBL-20 mL   Drains None  Tourniquet time-  Total Tourniquet Time Documented: Thigh (Left) - 41 minutes Total: Thigh (Left) - 41 minutes     Complications- None  Condition-PACU - hemodynamically stable.   Brief Clinical Note   Jeremiah Kelley is a 74 y.o. year old male with end stage OA of his left knee with progressively worsening pain and dysfunction. He has constant pain, with activity and at rest and significant functional deficits with difficulties even with ADLs. He has had extensive non-op management including analgesics, injections of cortisone and viscosupplements, and home exercise program, but remains in significant pain with significant dysfunction. Radiographs show bone on bone arthritis all 3 compartments. He presents now for left Total Knee Arthroplasty.    Procedure in detail---   The patient is brought into the operating room and positioned supine on the operating table. After successful administration of  Adductor canal block and spinal,   a tourniquet is placed high on the  Left thigh(s) and the lower extremity is prepped and draped in the usual sterile fashion. Time out is performed by the operating team and then the  Left lower extremity is wrapped in Esmarch, knee flexed and the tourniquet inflated to 300 mmHg.       A midline incision is made with a ten blade through the subcutaneous tissue to the level of the extensor mechanism. A fresh blade is used to make a medial parapatellar arthrotomy. Soft tissue over the proximal medial tibia is subperiosteally elevated to the joint line with a knife and into the semimembranosus bursa with a Cobb elevator. Soft  tissue over the proximal lateral tibia is elevated with attention being paid to avoiding the patellar tendon on the tibial tubercle. The patella is everted, knee flexed 90 degrees and the ACL and PCL are removed. Findings are bone on bone all 3 compartments with massive global osteophytes.        The drill is used to create a starting hole in the distal femur and the canal is thoroughly irrigated with sterile saline to remove the fatty contents. The 5 degree Left  valgus alignment guide is placed into the femoral canal and the distal femoral cutting block is pinned to remove 10 mm off the distal femur. Resection is made with an oscillating saw.      The tibia is subluxed forward and the menisci are removed. The extramedullary alignment guide is placed referencing proximally at the medial aspect of the tibial tubercle and distally along the second metatarsal axis and tibial crest. The block is pinned to remove 26mm off the more deficient medial  side. Resection is made with an oscillating saw. Size 8is the most appropriate size for the tibia and the proximal tibia is prepared with the modular drill and keel punch for that size.      The femoral sizing guide is placed and size 8 is most appropriate. Rotation is marked off the epicondylar axis and confirmed by creating a rectangular flexion gap at 90 degrees. The size 8 cutting block is pinned in this rotation and the anterior, posterior and chamfer cuts are made with the oscillating saw. The intercondylar block is then placed and that cut is made.  Trial size 8 tibial component, trial size 8 posterior stabilized femur and a 12  mm posterior stabilized rotating platform insert trial is placed. Full extension is achieved with excellent varus/valgus and anterior/posterior balance throughout full range of motion. The patella is everted and thickness measured to be 27  mm. Free hand resection is taken to 15 mm, a 41 template is placed, lug holes are drilled, trial  patella is placed, and it tracks normally. Osteophytes are removed off the posterior femur with the trial in place. All trials are removed and the cut bone surfaces prepared with pulsatile lavage. Cement is mixed and once ready for implantation, the size 8 tibial implant, size  8 posterior stabilized femoral component, and the size 41 patella are cemented in place and the patella is held with the clamp. The trial insert is placed and the knee held in full extension. The Exparel (20 ml mixed with 60 ml saline) is injected into the extensor mechanism, posterior capsule, medial and lateral gutters and subcutaneous tissues.  All extruded cement is removed and once the cement is hard the permanent 12 mm posterior stabilized rotating platform insert is placed into the tibial tray.      The wound is copiously irrigated with saline solution and the extensor mechanism closed with # 0 Stratofix suture. The tourniquet is released for a total tourniquet time of 41  minutes. Flexion against gravity is 140 degrees and the patella tracks normally. Subcutaneous tissue is closed with 2.0 vicryl and subcuticular with running 4.0 Monocryl. The incision is cleaned and dried and steri-strips and a bulky sterile dressing are applied. The limb is placed into a knee immobilizer and the patient is awakened and transported to recovery in stable condition.      Please note that a surgical assistant was a medical necessity for this procedure in order to perform it in a safe and expeditious manner. Surgical assistant was necessary to retract the ligaments and vital neurovascular structures to prevent injury to them and also necessary for proper positioning of the limb to allow for anatomic placement of the prosthesis.   [name] Vansh Reckart, MD    06/03/2020, 9:47 AM

## 2020-06-04 ENCOUNTER — Encounter (HOSPITAL_COMMUNITY): Payer: Self-pay | Admitting: Orthopedic Surgery

## 2020-06-04 DIAGNOSIS — M1712 Unilateral primary osteoarthritis, left knee: Secondary | ICD-10-CM | POA: Diagnosis not present

## 2020-06-04 LAB — CBC
HCT: 32.3 % — ABNORMAL LOW (ref 39.0–52.0)
Hemoglobin: 11.3 g/dL — ABNORMAL LOW (ref 13.0–17.0)
MCH: 30.9 pg (ref 26.0–34.0)
MCHC: 35 g/dL (ref 30.0–36.0)
MCV: 88.3 fL (ref 80.0–100.0)
Platelets: 138 10*3/uL — ABNORMAL LOW (ref 150–400)
RBC: 3.66 MIL/uL — ABNORMAL LOW (ref 4.22–5.81)
RDW: 14 % (ref 11.5–15.5)
WBC: 10 10*3/uL (ref 4.0–10.5)
nRBC: 0 % (ref 0.0–0.2)

## 2020-06-04 LAB — BASIC METABOLIC PANEL
Anion gap: 11 (ref 5–15)
BUN: 15 mg/dL (ref 8–23)
CO2: 22 mmol/L (ref 22–32)
Calcium: 8.4 mg/dL — ABNORMAL LOW (ref 8.9–10.3)
Chloride: 103 mmol/L (ref 98–111)
Creatinine, Ser: 1.1 mg/dL (ref 0.61–1.24)
GFR, Estimated: >60 mL/min (ref >60)
Glucose, Bld: 160 mg/dL — ABNORMAL HIGH (ref 70–99)
Potassium: 3.5 mmol/L (ref 3.5–5.1)
Sodium: 136 mmol/L (ref 135–145)

## 2020-06-04 MED ORDER — METHOCARBAMOL 500 MG PO TABS
500.0000 mg | ORAL_TABLET | Freq: Four times a day (QID) | ORAL | 0 refills | Status: DC | PRN
Start: 2020-06-04 — End: 2022-08-19

## 2020-06-04 MED ORDER — GABAPENTIN 300 MG PO CAPS: ORAL_CAPSULE | ORAL | 0 refills | Status: DC

## 2020-06-04 MED ORDER — ASPIRIN 325 MG PO TBEC: 325.0000 mg | DELAYED_RELEASE_TABLET | Freq: Two times a day (BID) | ORAL | 0 refills | Status: AC

## 2020-06-04 MED ORDER — OXYCODONE HCL 5 MG PO TABS
5.0000 mg | ORAL_TABLET | Freq: Four times a day (QID) | ORAL | 0 refills | Status: DC | PRN
Start: 2020-06-04 — End: 2022-08-19

## 2020-06-04 MED ORDER — TRAMADOL HCL 50 MG PO TABS
50.0000 mg | ORAL_TABLET | Freq: Four times a day (QID) | ORAL | 0 refills | Status: DC | PRN
Start: 2020-06-04 — End: 2022-08-19

## 2020-06-04 NOTE — Progress Notes (Signed)
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 Pt reported he feels fatigued, but pain is better. Pt ambulated min assist with RW 16ft. Pt demonstrated decreased safety when fatigued and c/o lightheadedness. Pt is currently not safe to d/c home today. Pt has a disabled wife and his daughter will be assisting with their care. Pt reported he is scheduled for outpatient therapy and he would be driving himself. I would recommend HHPT services initially and then transition to outpatient. Pt will continue to benefit from acute PT services in anticipation of d/c home tomorrow after therapy.   Follow Up Recommendations  Follow surgeon's recommendation for DC plan and follow-up therapies;Home health PT     Equipment Recommendations  None recommended by PT    Recommendations for Other Services       Precautions / Restrictions Precautions Precautions: Fall;Knee Restrictions Other Position/Activity Restrictions: WBAT    Mobility  Bed Mobility Overal bed mobility: Needs Assistance                Transfers Overall transfer level: Needs assistance Equipment used: Rolling walker (2 wheeled) Transfers: Sit to/from Stand Sit to Stand: Min assist;From elevated surface         General transfer comment: increased effort and cues to increase forward trunk flexion  Ambulation/Gait Ambulation/Gait assistance: Min assist Gait Distance (Feet): 50 Feet Assistive device: Rolling walker (2 wheeled) Gait Pattern/deviations: Step-to pattern;Decreased step length - right;Decreased step length - left;Shuffle;Wide base of support Gait velocity: decr   General Gait Details: increased effort and slow cadence, cues for walker placement and stepping for improved safey  with RW, cues for more upright posture.   Stairs             Wheelchair Mobility    Modified Rankin (Stroke Patients Only)       Balance Overall balance assessment: Needs assistance Sitting-balance support: No upper extremity supported;Feet supported Sitting balance-Leahy Scale: Good     Standing balance support: Bilateral upper extremity supported Standing balance-Leahy Scale: Fair                              Cognition Arousal/Alertness: Awake/alert Behavior During Therapy: WFL for tasks assessed/performed Overall Cognitive Status: Within Functional Limits for tasks assessed                                        Exercises Total Joint Exercises Ankle Circles/Pumps: AROM;Both;15 reps;Supine Quad Sets: AROM;Strengthening;Left;Seated Heel Slides: AAROM;Strengthening;Left;10 reps;Seated Hip ABduction/ADduction: Strengthening;Left;10 reps;AAROM;Seated Straight Leg Raises: AAROM;Strengthening;Left;10 reps;Seated Long Arc Quad: AROM;Strengthening;Left;10 reps;Seated Knee Flexion: AROM;Strengthening;Left;10 reps;Seated Goniometric ROM: 80*    General Comments General comments (skin integrity, edema, etc.): Pt assisted to BR with min assist, pt limited by fatigue. Pt reported feeling "clammy" after walk and worn out.      Pertinent Vitals/Pain Pain Assessment: 0-10 Pain Score: 2  Pain Location: L knee Pain Descriptors / Indicators: Discomfort Pain Intervention(s): Limited activity within patient's tolerance;Repositioned;Monitored during session    Home Living  Prior Function            PT Goals (current goals can now be found in the care plan section) Progress towards PT goals: Progressing toward goals    Frequency    7X/week      PT Plan Current plan remains appropriate    Co-evaluation              AM-PAC PT "6 Clicks" Mobility   Outcome Measure  Help needed turning from your  back to your side while in a flat bed without using bedrails?: A Little Help needed moving from lying on your back to sitting on the side of a flat bed without using bedrails?: A Little Help needed moving to and from a bed to a chair (including a wheelchair)?: A Little Help needed standing up from a chair using your arms (e.g., wheelchair or bedside chair)?: A Little Help needed to walk in hospital room?: A Little Help needed climbing 3-5 steps with a railing? : A Lot 6 Click Score: 17    End of Session Equipment Utilized During Treatment: Gait belt Activity Tolerance: Patient limited by fatigue Patient left: in chair;with call bell/phone within reach;with chair alarm set Nurse Communication: Mobility status PT Visit Diagnosis: Difficulty in walking, not elsewhere classified (R26.2)     Time: 1025-8527 PT Time Calculation (min) (ACUTE ONLY): 27 min  Charges:  $Gait Training: 8-22 mins $Therapeutic Exercise: 8-22 mins                      Lelon Mast 06/04/2020, 2:46 PM

## 2020-06-04 NOTE — TOC Initial Note (Signed)
Transition of Care [facility]) - Initial/Assessment Note    Patient Details  Name: Cartier Washko MRN: [id number] Date of Birth: 1945-10-23  [facility]) CM/SW Contact:    Joaquin Courts, RN Phone Number: 06/04/2020, 9:32 AM  Clinical Narrative:   CM spoke with patient who confirms plan for OPPT and reports has rolling walker and 3in1 at home.                 Expected Discharge Plan: Home/Self Care Barriers to Discharge: No Barriers Identified   Patient Goals and CMS Choice Patient states their goals for this hospitalization and ongoing recovery are:: to go home with outpatient physical therapy      Expected Discharge Plan and Services Expected Discharge Plan: Home/Self Care   Discharge Planning Services: CM Consult     Expected Discharge Date: 06/04/20               DME Arranged: N/A DME Agency: NA       HH Arranged: NA          Prior Living Arrangements/Services     Patient language and need for interpreter reviewed:: Yes Do you feel safe going back to the place where you live?: Yes      Need for Family Participation in Patient Care: Yes (Comment) Care giver support system in place?: Yes (comment)   Criminal Activity/Legal Involvement Pertinent to Current Situation/Hospitalization: No - Comment as needed  Activities of Daily Living Home Assistive Devices/Equipment: Blood pressure cuff, Cane (specify quad or straight), Wheelchair, [employer], Wellsite geologist, Dentures (specify type) ADL Screening (condition at time of admission) Patient's cognitive ability adequate to safely complete daily activities?: Yes Is the patient deaf or have difficulty hearing?: No Does the patient have difficulty seeing, even when wearing glasses/contacts?: No Does the patient have difficulty concentrating, remembering, or making decisions?: No Patient able to express need for assistance with ADLs?: Yes Does the patient have difficulty dressing or bathing?:  No Independently performs ADLs?: Yes (appropriate for developmental age) Does the patient have difficulty walking or climbing stairs?: Yes Weakness of Legs: Left Weakness of Arms/Hands: None  Permission Sought/Granted                  Emotional Assessment Appearance:: Appears stated age Attitude/Demeanor/Rapport: Engaged Affect (typically observed): Accepting Orientation: : Oriented to Self, Oriented to Place, Oriented to  Time, Oriented to Situation   Psych Involvement: No (comment)  Admission diagnosis:  Primary osteoarthritis of left knee [M17.12] Patient Active Problem List   Diagnosis Date Noted  . Primary osteoarthritis of left knee 06/03/2020  . HLD (hyperlipidemia) 09/17/2016  . Essential hypertension 09/17/2016  . Stroke ([place]) 09/17/2016  . Chronic GERD 09/17/2016  . OA (osteoarthritis) of knee 09/17/2016  . Knee joint replacement status, right 09/17/2016  . Colon polyp 09/17/2016   PCP:  Celene Squibb, MD Pharmacy:   [id number] - , [address] [place] [address] Phone: [phone] Fax: [facility] [place] [place] [address] Phone: [phone] Fax: [phone]     Social Determinants of Health (SDOH) Interventions    Readmission Risk Interventions No flowsheet data found.

## 2020-06-04 NOTE — Progress Notes (Signed)
   Subjective: 1 Day Post-Op Procedure(s) (LRB): TOTAL KNEE ARTHROPLASTY (Left) Patient reports pain as mild.   Patient seen in rounds by Dr. Wynelle Link. Patient is well, and has had no acute complaints or problems other than pain in the left knee. No issues overnight. Did well with therapy yesterday, ambulated 50'. Denies chest pain, SOB, or calf pain. Foley catheter removed this AM. We will continue therapy today.   Objective: Vital signs in last 24 hours: Temp:  [97.4 F (36.3 C)-98.1 F (36.7 C)] 98.1 F (36.7 C) (12/07 0417) Pulse Rate:  [42-76] 75 (12/07 0417) Resp:  [11-19] 18 (12/07 0417) BP: (119-157)/(66-89) 126/78 (12/07 0417) SpO2:  [96 %-100 %] 100 % (12/07 0417)  Intake/Output from previous day:  Intake/Output Summary (Last 24 hours) at 06/04/2020 0720 Last data filed at 06/04/2020 0500 Gross per 24 hour  Intake 2923.8 ml  Output 1820 ml  Net 1103.8 ml     Intake/Output this shift: No intake/output data recorded.  Labs: Recent Labs    06/04/20 0357  HGB 11.3*   Recent Labs    06/04/20 0357  WBC 10.0  RBC 3.66*  HCT 32.3*  PLT 138*   Recent Labs    06/04/20 0357  NA 136  K 3.5  CL 103  CO2 22  BUN 15  CREATININE 1.10  GLUCOSE 160*  CALCIUM 8.4*   No results for input(s): LABPT, INR in the last 72 hours.  Exam: General - Patient is Alert and Oriented Extremity - Neurologically intact Neurovascular intact Sensation intact distally Dorsiflexion/Plantar flexion intact Dressing - dressing C/D/I Motor Function - intact, moving foot and toes well on exam.   Past Medical History:  Diagnosis Date  . Arthritis    osteoarthritis knees hips  . GERD (gastroesophageal reflux disease)   . Hyperlipidemia   . Hypertension   . Stroke [facility])     Assessment/Plan: 1 Day Post-Op Procedure(s) (LRB): TOTAL KNEE ARTHROPLASTY (Left) Principal Problem:   OA (osteoarthritis) of knee Active Problems:   Primary osteoarthritis of left knee  Estimated  body mass index is 36.33 kg/m as calculated from the following:   Height as of this encounter: 5\' 9"  (1.753 m).   Weight as of this encounter: 111.6 kg. Advance diet Up with therapy D/C IV fluids   Patient's anticipated LOS is less than 2 midnights, meeting these requirements: - Lives within 1 hour of care - Has a competent adult at home to recover with post-op recover - NO history of  - Chronic pain requiring opiods  - Diabetes  - Coronary Artery Disease  - Heart failure  - Heart attack  - Stroke  - DVT/VTE  - Cardiac arrhythmia  - Respiratory Failure/COPD  - Renal failure  - Anemia  - Advanced Liver disease  DVT Prophylaxis - Aspirin Weight bearing as tolerated. Continue therapy.  Plan is to go Home after hospital stay. Plan for discharge if progresses with therapy and is meeting his goals. Scheduled for OPPT at [facility]). Follow-up in the office in 2 weeks.   The PDMP database was reviewed today prior to any opioid medications being prescribed to this patient.   Theresa Duty, PA-C Orthopedic Surgery [phone] 06/04/2020, 7:20 AM

## 2020-06-04 NOTE — Progress Notes (Signed)
Physical Therapy Treatment Patient Details Name: Jeremiah Kelley MRN: [id number] DOB: 1946/06/04 Today's Date: 06/04/2020    History of Present Illness Pt s/p L TKR and with hx fo L THR, R TKR, CVA and L wrist fusioni    PT Comments    Pt was limited today by fatigue, nausea and dizziness. Pt did begin LE TKR exercises with min assist. Pt ambulated 50 ft with RW min assist and c/o dizziness requiring a chair brought over to prevent a fall. Pt felt better after a few minutes and BP was checked. Pt will need BID treatment today to reinforce mobility. Pt reports his wife is dependent and his daughter will be available to assist them after d/c home. Pt will continue to benefit from acute skilled PT to maximize mobility and Independence for d/c home.   Follow Up Recommendations  Follow surgeon's recommendation for DC plan and follow-up therapies     Equipment Recommendations  None recommended by PT    Recommendations for Other Services       Precautions / Restrictions Precautions Precautions: Fall;Knee Restrictions Other Position/Activity Restrictions: WBAT    Mobility  Bed Mobility Overal bed mobility: Needs Assistance Bed Mobility: Supine to Sit     Supine to sit: Min guard     General bed mobility comments: a little impulsive, needed to go to BR and attempted to move to EOB with bed railing still up  Transfers Overall transfer level: Needs assistance Equipment used: Rolling walker (2 wheeled) Transfers: Sit to/from Stand Sit to Stand: Min assist;From elevated surface         General transfer comment: cues for LE management and use of UEs to self assist;  min assist for balance, pt min assist for transfers from commode and cues for upright posture while at sink.  Ambulation/Gait Ambulation/Gait assistance: Min assist Gait Distance (Feet): 50 Feet Assistive device: Rolling walker (2 wheeled) Gait Pattern/deviations: Step-to pattern;Decreased step length -  right;Decreased step length - left;Shuffle;Wide base of support Gait velocity: decr   General Gait Details: cues for sequence, posture and position from RW. Pt c/o dizziness and nausea with gait. Pt needed a chair brought to him and a rest break. BP 132/64. Pt requested ambulation to bathroom and then returned to recliner.   Stairs             Wheelchair Mobility    Modified Rankin (Stroke Patients Only)       Balance Overall balance assessment: Needs assistance Sitting-balance support: No upper extremity supported;Feet supported Sitting balance-Leahy Scale: Good     Standing balance support: Bilateral upper extremity supported Standing balance-Leahy Scale: Fair                              Cognition Arousal/Alertness: Awake/alert Behavior During Therapy: WFL for tasks assessed/performed Overall Cognitive Status: Within Functional Limits for tasks assessed                                        Exercises Total Joint Exercises Quad Sets: AROM;Strengthening;Left;Seated Heel Slides: AAROM;Strengthening;Left;10 reps;Seated Hip ABduction/ADduction: Strengthening;Left;10 reps;AAROM;Seated Straight Leg Raises: AAROM;Strengthening;Left;10 reps;Seated Long Arc Quad: AROM;Strengthening;Left;10 reps;Seated Knee Flexion: AROM;Strengthening;Left;10 reps;Seated Goniometric ROM: 75*    General Comments General comments (skin integrity, edema, etc.): assisted pt to the BR. Pt was min assist to lower safely to commode.  Pertinent Vitals/Pain Pain Assessment: 0-10 Pain Score: 8  Pain Location: L knee Pain Descriptors / Indicators: Discomfort Pain Intervention(s): Limited activity within patient's tolerance;Monitored during session;Repositioned    Home Living                      Prior Function            PT Goals (current goals can now be found in the care plan section) Progress towards PT goals: Progressing toward goals     Frequency    7X/week      PT Plan Current plan remains appropriate    Co-evaluation              AM-PAC PT "6 Clicks" Mobility   Outcome Measure  Help needed turning from your back to your side while in a flat bed without using bedrails?: A Little Help needed moving from lying on your back to sitting on the side of a flat bed without using bedrails?: A Little Help needed moving to and from a bed to a chair (including a wheelchair)?: A Little Help needed standing up from a chair using your arms (e.g., wheelchair or bedside chair)?: A Little Help needed to walk in hospital room?: A Little Help needed climbing 3-5 steps with a railing? : A Lot 6 Click Score: 17    End of Session Equipment Utilized During Treatment: Gait belt Activity Tolerance: Patient limited by fatigue Patient left: in chair;with call bell/phone within reach;with chair alarm set Nurse Communication: Mobility status PT Visit Diagnosis: Difficulty in walking, not elsewhere classified (R26.2)     Time: 5035-4656 PT Time Calculation (min) (ACUTE ONLY): 47 min  Charges:  $Gait Training: 8-22 mins $Therapeutic Exercise: 8-22 mins $Therapeutic Activity: 8-22 mins                      Lelon Mast 06/04/2020, 10:13 AM

## 2020-06-05 ENCOUNTER — Ambulatory Visit (HOSPITAL_COMMUNITY): Payer: Medicare HMO | Admitting: Physical Therapy

## 2020-06-05 ENCOUNTER — Telehealth (HOSPITAL_COMMUNITY): Payer: Self-pay | Admitting: Physical Therapy

## 2020-06-05 ENCOUNTER — Encounter (HOSPITAL_COMMUNITY): Payer: Self-pay

## 2020-06-05 DIAGNOSIS — M1712 Unilateral primary osteoarthritis, left knee: Secondary | ICD-10-CM | POA: Diagnosis not present

## 2020-06-05 LAB — BASIC METABOLIC PANEL
Anion gap: 13 (ref 5–15)
BUN: 14 mg/dL (ref 8–23)
CO2: 22 mmol/L (ref 22–32)
Calcium: 8.3 mg/dL — ABNORMAL LOW (ref 8.9–10.3)
Chloride: 101 mmol/L (ref 98–111)
Creatinine, Ser: 0.9 mg/dL (ref 0.61–1.24)
GFR, Estimated: >60 mL/min (ref >60)
Glucose, Bld: 138 mg/dL — ABNORMAL HIGH (ref 70–99)
Potassium: 3.4 mmol/L — ABNORMAL LOW (ref 3.5–5.1)
Sodium: 136 mmol/L (ref 135–145)

## 2020-06-05 LAB — CBC
HCT: 29.9 % — ABNORMAL LOW (ref 39.0–52.0)
Hemoglobin: 10.2 g/dL — ABNORMAL LOW (ref 13.0–17.0)
MCH: 30.4 pg (ref 26.0–34.0)
MCHC: 34.1 g/dL (ref 30.0–36.0)
MCV: 89.3 fL (ref 80.0–100.0)
Platelets: 123 10*3/uL — ABNORMAL LOW (ref 150–400)
RBC: 3.35 MIL/uL — ABNORMAL LOW (ref 4.22–5.81)
RDW: 14.2 % (ref 11.5–15.5)
WBC: 9.8 10*3/uL (ref 4.0–10.5)
nRBC: 0 % (ref 0.0–0.2)

## 2020-06-05 MED ORDER — POTASSIUM CHLORIDE CRYS ER 20 MEQ PO TBCR
40.0000 meq | EXTENDED_RELEASE_TABLET | Freq: Two times a day (BID) | ORAL | Status: DC
  Administered 2020-06-05: 40 meq via ORAL
  Filled 2020-06-05: qty 2

## 2020-06-05 NOTE — Progress Notes (Signed)
Orthopedic Tech Progress Note Patient Details:  Brandley Aldrete Sep 22, 1945 [id number]  Patient ID: Jeremiah Kelley, male   DOB: Jan 27, 1946, 74 y.o.   MRN: [id number]   [place] Pink 06/05/2020, 8:24 AMPickup CPM

## 2020-06-05 NOTE — Progress Notes (Signed)
Physical Therapy Treatment Patient Details Name: Jeremiah Kelley MRN: [id number] DOB: [birth year] Today's Date: 06/05/2020    History of Present Illness Pt s/p L TKR and with hx fo L THR, R TKR, CVA and L wrist fusion    PT Comments    Pt reports already getting up to go to bathroom this morning.  Pt ambulated good distance in hallway and reports feeling much better today.  Pt declined performing exercises however has HEP handout and did not have any questions.  Pt feels ready for d/c home today.   Follow Up Recommendations  Follow surgeon's recommendation for DC plan and follow-up therapies;Home health PT     Equipment Recommendations  None recommended by PT    Recommendations for Other Services       Precautions / Restrictions Precautions Precautions: Fall;Knee Restrictions Other Position/Activity Restrictions: WBAT    Mobility  Bed Mobility               General bed mobility comments: pt up in recliner  Transfers Overall transfer level: Needs assistance Equipment used: Rolling walker (2 wheeled) Transfers: Sit to/from Stand Sit to Stand: Supervision         General transfer comment: verbal cues for technique, controlling descent  Ambulation/Gait Ambulation/Gait assistance: Supervision Gait Distance (Feet): 100 Feet Assistive device: Rolling walker (2 wheeled) Gait Pattern/deviations: Step-to pattern;Wide base of support;Antalgic Gait velocity: decr   General Gait Details: verbal cues for sequence, RW positioning, posture, heel strike   Stairs             Wheelchair Mobility    Modified Rankin (Stroke Patients Only)       Balance                                            Cognition Arousal/Alertness: Awake/alert Behavior During Therapy: WFL for tasks assessed/performed Overall Cognitive Status: Within Functional Limits for tasks assessed                                        Exercises       General Comments        Pertinent Vitals/Pain Pain Assessment: 0-10 Pain Score: 3  Pain Location: L knee Pain Descriptors / Indicators: Discomfort;Sore Pain Intervention(s): Repositioned;Monitored during session    Home Living                      Prior Function            PT Goals (current goals can now be found in the care plan section) Progress towards PT goals: Progressing toward goals    Frequency    7X/week      PT Plan Current plan remains appropriate    Co-evaluation              AM-PAC PT "6 Clicks" Mobility   Outcome Measure  Help needed turning from your back to your side while in a flat bed without using bedrails?: A Little Help needed moving from lying on your back to sitting on the side of a flat bed without using bedrails?: A Little Help needed moving to and from a bed to a chair (including a wheelchair)?: A Little Help needed standing up from a chair using your arms (e.g., wheelchair or  bedside chair)?: A Little Help needed to walk in hospital room?: A Little Help needed climbing 3-5 steps with a railing? : A Little 6 Click Score: 18    End of Session Equipment Utilized During Treatment: Gait belt Activity Tolerance: Patient tolerated treatment well Patient left: in chair;with call bell/phone within reach;with chair alarm set Nurse Communication: Mobility status PT Visit Diagnosis: Difficulty in walking, not elsewhere classified (R26.2)     Time: 0100-7121 PT Time Calculation (min) (ACUTE ONLY): 12 min  Charges:  $Gait Training: 8-22 mins                     Arlyce Dice, DPT Acute Rehabilitation Services Pager: [phone] Office: [phone]  Trena Platt 06/05/2020, 4:39 PM

## 2020-06-05 NOTE — Plan of Care (Signed)
Patient discharged home in stable condition. He is waiting on his ride

## 2020-06-05 NOTE — Progress Notes (Signed)
   Subjective: 2 Days Post-Op Procedure(s) (LRB): TOTAL KNEE ARTHROPLASTY (Left) Patient reports pain as mild.   Patient seen in rounds for Dr. Wynelle Link. Patient is well, and has had no acute complaints or problems other than soreness in his hamstrings. Denies chest pain, SOB, or calf pain. Positive flatus. Voiding without difficulty. No issues overnight. Did not pass PT yesterday. Plan is to go Home after hospital stay.  Objective: Vital signs in last 24 hours: Temp:  [97.7 F (36.5 C)-98.8 F (37.1 C)] 98.8 F (37.1 C) (12/08 0432) Pulse Rate:  [69-83] 69 (12/08 0432) Resp:  [16-18] 18 (12/08 0432) BP: (137-155)/(75-86) 142/76 (12/08 0432) SpO2:  [100 %] 100 % (12/08 0432)  Intake/Output from previous day:  Intake/Output Summary (Last 24 hours) at 06/05/2020 0834 Last data filed at 06/04/2020 1657 Gross per 24 hour  Intake 650 ml  Output 350 ml  Net 300 ml    Intake/Output this shift: No intake/output data recorded.  Labs: Recent Labs    06/04/20 0357 06/05/20 0326  HGB 11.3* 10.2*   Recent Labs    06/04/20 0357 06/05/20 0326  WBC 10.0 9.8  RBC 3.66* 3.35*  HCT 32.3* 29.9*  PLT 138* 123*   Recent Labs    06/04/20 0357 06/05/20 0326  NA 136 136  K 3.5 3.4*  CL 103 101  CO2 22 22  BUN 15 14  CREATININE 1.10 0.90  GLUCOSE 160* 138*  CALCIUM 8.4* 8.3*   No results for input(s): LABPT, INR in the last 72 hours.  Exam: General - Patient is Alert and Oriented Extremity - Neurologically intact Neurovascular intact Sensation intact distally Dorsiflexion/Plantar flexion intact Dressing/Incision - clean, dry, no drainage Motor Function - intact, moving foot and toes well on exam.   Past Medical History:  Diagnosis Date  . Arthritis    osteoarthritis knees hips  . GERD (gastroesophageal reflux disease)   . Hyperlipidemia   . Hypertension   . Stroke ([place])     Assessment/Plan: 2 Days Post-Op Procedure(s) (LRB): TOTAL KNEE ARTHROPLASTY  (Left) Principal Problem:   OA (osteoarthritis) of knee Active Problems:   Primary osteoarthritis of left knee  Estimated body mass index is 36.33 kg/m as calculated from the following:   Height as of this encounter: 5\' 9"  (1.753 m).   Weight as of this encounter: 111.6 kg. Advance diet Up with therapy D/C IV fluids  DVT Prophylaxis - Aspirin Weight-bearing as tolerated  HHPT order placed, patient does not have a reliable ride to outpatient PT. Plan for 2 weeks HH followed by outpatient therapy at [facility]). Plan for discharge once cleared by PT. Follow-up in the office December 21st.  Jakarri Lesko [place], PA-C Orthopedic Surgery [phone] 06/05/2020, 8:34 AM

## 2020-06-05 NOTE — Telephone Encounter (Signed)
Jonni Sanger states pt is still in hospital - should be d/c to home health for two week and then come here. Need to confirm d/c before this apptment date of eval 12/27

## 2020-06-05 NOTE — TOC Transition Note (Addendum)
Transition of Care [facility]) - CM/SW Discharge Note   Patient Details  Name: Yassin Scales MRN: [id number] Date of Birth: 03-Dec-1945  Transition of Care [facility]) CM/SW Contact:  Lia Hopping, LCSW Phone Number: 06/05/2020, 11:57 AM   Clinical Narrative:    Total Knee Arthroplasty (Left)surgical intervention.  Re: [place] met with the patient at bedside to discuss home physical therapy for the next two weeks. Patient reports his spouse is currently active with [facility]. Patient prefers to use the same agency and therapist, if possible.  CSW confirm staff availability with staff Amy. The pt. request the agency bill the [place] and not his [facility] plan. The agency has requested [facility] authorization through the [place]. The agency will reach out to the patient today.  Patient confirm he has DME.  No other needs identified.     Final next level of care: [place] Barriers to Discharge: No Barriers Identified   Patient Goals and CMS Choice Patient states their goals for this hospitalization and ongoing recovery are:: to go home with outpatient physical therapy      Discharge Placement                       Discharge Plan and Services   Discharge Planning Services: CM Consult            DME Arranged: N/A DME Agency: NA       HH Arranged: PT HH Agency: [facility] Date [place]: 06/05/20 Time HH Agency Contacted: 1000 Representative spoke with at [place]: [place] ([place]) Interventions     Readmission Risk Interventions No flowsheet data found.

## 2020-06-08 DIAGNOSIS — Z96652 Presence of left artificial knee joint: Secondary | ICD-10-CM | POA: Diagnosis not present

## 2020-06-08 DIAGNOSIS — E785 Hyperlipidemia, unspecified: Secondary | ICD-10-CM | POA: Diagnosis not present

## 2020-06-08 DIAGNOSIS — Z96642 Presence of left artificial hip joint: Secondary | ICD-10-CM | POA: Diagnosis not present

## 2020-06-08 DIAGNOSIS — Z96651 Presence of right artificial knee joint: Secondary | ICD-10-CM | POA: Diagnosis not present

## 2020-06-08 DIAGNOSIS — I10 Essential (primary) hypertension: Secondary | ICD-10-CM | POA: Diagnosis not present

## 2020-06-08 DIAGNOSIS — K219 Gastro-esophageal reflux disease without esophagitis: Secondary | ICD-10-CM | POA: Diagnosis not present

## 2020-06-08 DIAGNOSIS — Z471 Aftercare following joint replacement surgery: Secondary | ICD-10-CM | POA: Diagnosis not present

## 2020-06-08 DIAGNOSIS — I6930 Unspecified sequelae of cerebral infarction: Secondary | ICD-10-CM | POA: Diagnosis not present

## 2020-06-10 ENCOUNTER — Ambulatory Visit (HOSPITAL_COMMUNITY): Payer: Medicare HMO | Admitting: Physical Therapy

## 2020-06-10 NOTE — Discharge Summary (Signed)
Physician Discharge Summary   Patient ID: Jeremiah Kelley MRN: [id number] DOB/AGE: 1945-11-10 74 y.o.  Admit date: 06/03/2020 Discharge date: 06/05/2020  Primary Diagnosis: Osteoarthritis, left knee   Admission Diagnoses:  Past Medical History:  Diagnosis Date  . Arthritis    osteoarthritis knees hips  . GERD (gastroesophageal reflux disease)   . Hyperlipidemia   . Hypertension   . Stroke [facility])    Discharge Diagnoses:   Principal Problem:   OA (osteoarthritis) of knee Active Problems:   Primary osteoarthritis of left knee  Estimated body mass index is 36.33 kg/m as calculated from the following:   Height as of this encounter: 5\' 9"  (1.753 m).   Weight as of this encounter: 111.6 kg.  Procedure:  Procedure(s) (LRB): TOTAL KNEE ARTHROPLASTY (Left)   Consults: None  HPI: Jeremiah Kelley is a 74 y.o. year old male with end stage OA of his left knee with progressively worsening pain and dysfunction. He has constant pain, with activity and at rest and significant functional deficits with difficulties even with ADLs. He has had extensive non-op management including analgesics, injections of cortisone and viscosupplements, and home exercise program, but remains in significant pain with significant dysfunction. Radiographs show bone on bone arthritis all 3 compartments. He presents now for left Total Knee Arthroplasty.    Laboratory Data: Admission on 06/03/2020, Discharged on 06/05/2020  Component Date Value Ref Range Status  . WBC 06/04/2020 10.0  4.0 - 10.5 K/uL Final  . RBC 06/04/2020 3.66* 4.22 - 5.81 MIL/uL Final  . Hemoglobin 06/04/2020 11.3* 13.0 - 17.0 g/dL Final  . HCT 06/04/2020 32.3* 39.0 - 52.0 % Final  . MCV 06/04/2020 88.3  80.0 - 100.0 fL Final  . MCH 06/04/2020 30.9  26.0 - 34.0 pg Final  . MCHC 06/04/2020 35.0  30.0 - 36.0 g/dL Final  . RDW 06/04/2020 14.0  11.5 - 15.5 % Final  . Platelets 06/04/2020 138* 150 - 400 K/uL Final  . nRBC 06/04/2020 0.0  0.0 - 0.2 %  Final   Performed at [facility], [place] [address], [place], [place] [address]  . Sodium 06/04/2020 136  135 - 145 mmol/L Final  . Potassium 06/04/2020 3.5  3.5 - 5.1 mmol/L Final  . Chloride 06/04/2020 103  98 - 111 mmol/L Final  . CO2 06/04/2020 22  22 - 32 mmol/L Final  . Glucose, Bld 06/04/2020 160* 70 - 99 mg/dL Final   Glucose reference range applies only to samples taken after fasting for at least 8 hours.  . BUN 06/04/2020 15  8 - 23 mg/dL Final  . Creatinine, Ser 06/04/2020 1.10  0.61 - 1.24 mg/dL Final  . Calcium 06/04/2020 8.4* 8.9 - 10.3 mg/dL Final  . GFR, Estimated 06/04/2020 >60  >60 mL/min Final   Comment: (NOTE) Calculated using the CKD-EPI Creatinine Equation (2021)   . Anion gap 06/04/2020 11  5 - 15 Final   Performed at [facility], [place] [address] [place], [place] [address]  . WBC 06/05/2020 9.8  4.0 - 10.5 K/uL Final  . RBC 06/05/2020 3.35* 4.22 - 5.81 MIL/uL Final  . Hemoglobin 06/05/2020 10.2* 13.0 - 17.0 g/dL Final  . HCT 06/05/2020 29.9* 39.0 - 52.0 % Final  . MCV 06/05/2020 89.3  80.0 - 100.0 fL Final  . MCH 06/05/2020 30.4  26.0 - 34.0 pg Final  . MCHC 06/05/2020 34.1  30.0 - 36.0 g/dL Final  . RDW 06/05/2020 14.2  11.5 - 15.5 % Final  .  Platelets 06/05/2020 123* 150 - 400 K/uL Final  . nRBC 06/05/2020 0.0  0.0 - 0.2 % Final   Performed at [facility], [place] [address] [place], [place] [address]  . Sodium 06/05/2020 136  135 - 145 mmol/L Final  . Potassium 06/05/2020 3.4* 3.5 - 5.1 mmol/L Final  . Chloride 06/05/2020 101  98 - 111 mmol/L Final  . CO2 06/05/2020 22  22 - 32 mmol/L Final  . Glucose, Bld 06/05/2020 138* 70 - 99 mg/dL Final   Glucose reference range applies only to samples taken after fasting for at least 8 hours.  . BUN 06/05/2020 14  8 - 23 mg/dL Final  . Creatinine, Ser 06/05/2020 0.90  0.61 - 1.24 mg/dL Final  . Calcium 06/05/2020 8.3* 8.9 - 10.3 mg/dL Final  . GFR, Estimated  06/05/2020 >60  >60 mL/min Final   Comment: (NOTE) Calculated using the CKD-EPI Creatinine Equation (2021)   . Anion gap 06/05/2020 13  5 - 15 Final   Performed at [facility], [place] [address] [place], [place] [address]  Hospital Outpatient Visit on 05/30/2020  Component Date Value Ref Range Status  . SARS Coronavirus 2 05/30/2020 NEGATIVE  NEGATIVE Final   Comment: (NOTE) SARS-CoV-2 target nucleic acids are NOT DETECTED.  The SARS-CoV-2 RNA is generally detectable in upper and lower respiratory specimens during the acute phase of infection. Negative results do not preclude SARS-CoV-2 infection, do not rule out co-infections with other pathogens, and should not be used as the sole basis for treatment or other patient management decisions. Negative results must be combined with clinical observations, patient history, and epidemiological information. The expected result is Negative.  Fact Sheet for Patients: SugarRoll.be  Fact Sheet for Healthcare Providers: https://www.woods-mathews.com/  This test is not yet approved or cleared by the [place] FDA and  has been authorized for detection and/or diagnosis of SARS-CoV-2 by FDA under an Emergency Use Authorization (EUA). This EUA will remain  in effect (meaning this test can be used) for the duration of the COVID-19 declaration under Se                          ction 564(b)(1) of the Act, 21 U.S.C. section 360bbb-3(b)(1), unless the authorization is terminated or revoked sooner.  Performed at [facility], [place] [address] [place], [place] [address]   Hospital Outpatient Visit on 05/28/2020  Component Date Value Ref Range Status  . MRSA, PCR 05/28/2020 NEGATIVE  NEGATIVE Final  . Staphylococcus aureus 05/28/2020 NEGATIVE  NEGATIVE Final   Comment: (NOTE) The Xpert SA Assay (FDA approved for NASAL specimens in patients 30 years of age and older), is one  component of a comprehensive surveillance program. It is not intended to diagnose infection nor to guide or monitor treatment. Performed at [facility], [place] [address] [place], [place] [address]   . aPTT 05/28/2020 31  24 - 36 seconds Final   Performed at [facility], [place] [address], [place], [place] [address]  . WBC 05/28/2020 6.7  4.0 - 10.5 K/uL Final  . RBC 05/28/2020 5.09  4.22 - 5.81 MIL/uL Final  . Hemoglobin 05/28/2020 15.6  13.0 - 17.0 g/dL Final  . HCT 05/28/2020 44.7  39.0 - 52.0 % Final  . MCV 05/28/2020 87.8  80.0 - 100.0 fL Final  . MCH 05/28/2020 30.6  26.0 - 34.0 pg Final  . MCHC 05/28/2020 34.9  30.0 - 36.0 g/dL Final  . RDW 05/28/2020 14.1  11.5 - 15.5 % Final  . Platelets 05/28/2020 178  150 - 400 K/uL Final  . nRBC 05/28/2020 0.0  0.0 - 0.2 % Final   Performed at [facility], [place] [address], [place], [place] [address]  . Sodium 05/28/2020 139  135 - 145 mmol/L Final  . Potassium 05/28/2020 3.5  3.5 - 5.1 mmol/L Final  . Chloride 05/28/2020 102  98 - 111 mmol/L Final  . CO2 05/28/2020 27  22 - 32 mmol/L Final  . Glucose, Bld 05/28/2020 95  70 - 99 mg/dL Final   Glucose reference range applies only to samples taken after fasting for at least 8 hours.  . BUN 05/28/2020 9  8 - 23 mg/dL Final  . Creatinine, Ser 05/28/2020 1.16  0.61 - 1.24 mg/dL Final  . Calcium 05/28/2020 8.9  8.9 - 10.3 mg/dL Final  . Total Protein 05/28/2020 7.3  6.5 - 8.1 g/dL Final  . Albumin 05/28/2020 4.4  3.5 - 5.0 g/dL Final  . AST 05/28/2020 28  15 - 41 U/L Final  . ALT 05/28/2020 19  0 - 44 U/L Final  . Alkaline Phosphatase 05/28/2020 73  38 - 126 U/L Final  . Total Bilirubin 05/28/2020 0.9  0.3 - 1.2 mg/dL Final  . GFR, Estimated 05/28/2020 >60  >60 mL/min Final   Comment: (NOTE) Calculated using the CKD-EPI Creatinine Equation (2021)   . Anion gap 05/28/2020 10  5 - 15 Final   Performed at [facility],  [place] [address] [place], [place] [address]  . Prothrombin Time 05/28/2020 13.1  11.4 - 15.2 seconds Final  . INR 05/28/2020 1.0  0.8 - 1.2 Final   Comment: (NOTE) INR goal varies based on device and disease states. Performed at [facility], [place] [address], [place], [place] [address]   . ABO/RH(D) 05/28/2020 O POS   Final  . Antibody Screen 05/28/2020 NEG   Final  . Sample Expiration 05/28/2020 06/06/2020,2359   Final  . Extend sample reason 05/28/2020    Final                   Value:NO TRANSFUSIONS OR PREGNANCY IN THE PAST 3 MONTHS Performed at [facility], [place] [address], [place],  [address]      X-Rays:No results found.  EKG: Orders placed or performed during the hospital encounter of 05/28/20  . EKG 12 lead per protocol  . EKG 12 lead per protocol     Hospital Course: Jeremiah Kelley is a 74 y.o. who was admitted to [facility]. They were brought to the operating room on 06/03/2020 and underwent Procedure(s): TOTAL KNEE ARTHROPLASTY.  Patient tolerated the procedure well and was later transferred to the recovery room and then to the orthopaedic floor for postoperative care. They were given PO and IV analgesics for pain control following their surgery. They were given 24 hours of postoperative antibiotics of  Anti-infectives (From admission, onward)   Start     Dose/Rate Route Frequency Ordered Stop   06/03/20 1430  ceFAZolin (ANCEF) IVPB 2g/100 mL premix        2 g 200 mL/hr over 30 Minutes Intravenous Every 6 hours 06/03/20 1127 06/04/20 0542   06/03/20 0600  ceFAZolin (ANCEF) IVPB 2g/100 mL premix        2 g 200 mL/hr over 30 Minutes Intravenous On call to O.R. 06/03/20 7741 06/03/20  0848     and started on DVT prophylaxis in the form of Aspirin.   PT and OT were ordered for total joint protocol. Discharge planning consulted to help with postop disposition and equipment needs. Patient had a good night on the evening of  surgery. They started to get up OOB with therapy on POD #0. Continued to work with therapy into POD #2. Pt was seen during rounds on day two and was ready to go home pending progress with therapy. Dressing was changed and the incision was clean, dry, and intact with no drainage. Pt worked with therapy for two additional sessions and was meeting their goals. He was discharged to home later that day in stable condition.  Diet: Regular diet Activity: WBAT Follow-up: in 2 weeks Disposition: Home with HHPT, to transition to OPPT in two weeks Discharged Condition: stable   Discharge Instructions    Call MD / Call 911   Complete by: As directed    If you experience chest pain or shortness of breath, CALL 911 and be transported to the hospital emergency room.  If you develope a fever above 101 F, pus (white drainage) or increased drainage or redness at the wound, or calf pain, call your surgeon's office.   Change dressing   Complete by: As directed    You may remove the bulky bandage (ACE wrap and gauze) two days after surgery. You will have an adhesive waterproof bandage underneath. Leave this in place until your first follow-up appointment.   Constipation Prevention   Complete by: As directed    Drink plenty of fluids.  Prune juice may be helpful.  You may use a stool softener, such as Colace (over the counter) 100 mg twice a day.  Use MiraLax (over the counter) for constipation as needed.   Diet - low sodium heart healthy   Complete by: As directed    Do not put a pillow under the knee. Place it under the heel.   Complete by: As directed    Driving restrictions   Complete by: As directed    No driving for two weeks   TED hose   Complete by: As directed    Use stockings (TED hose) for three weeks on both leg(s).  You may remove them at night for sleeping.   Weight bearing as tolerated   Complete by: As directed      Allergies as of 06/05/2020   No Known Allergies     Medication List     STOP taking these medications   celecoxib 100 MG capsule Commonly known as: CELEBREX   diclofenac Sodium 1 % Gel Commonly known as: VOLTAREN   rivaroxaban 10 MG Tabs tablet Commonly known as: XARELTO     TAKE these medications   amLODipine 5 MG tablet Commonly known as: NORVASC Take 5 mg by mouth daily.   aspirin 325 MG EC tablet Take 1 tablet (325 mg total) by mouth 2 (two) times daily for 20 days. Then resume one 81 mg aspirin once a day. What changed:   medication strength  how much to take  when to take this  additional instructions   cholecalciferol 25 MCG (1000 UNIT) tablet Commonly known as: VITAMIN D3 Take 1,000 Units by mouth daily.   gabapentin 300 MG capsule Commonly known as: NEURONTIN Take a 300 mg capsule three times a day for two weeks following surgery.Then take a 300 mg capsule two times a day for two weeks. Then take a 300  mg capsule once a day for two weeks. Then discontinue.   hydrochlorothiazide 25 MG tablet Commonly known as: HYDRODIURIL Take 25 mg by mouth daily.   methocarbamol 500 MG tablet Commonly known as: ROBAXIN Take 1 tablet (500 mg total) by mouth every 6 (six) hours as needed for muscle spasms.   OPTIVE 0.5-0.9 % ophthalmic solution Generic drug: carboxymethylcellul-glycerin Place 1 drop into both eyes in the morning and at bedtime.   oxyCODONE 5 MG immediate release tablet Commonly known as: Oxy IR/ROXICODONE Take 1-2 tablets (5-10 mg total) by mouth every 6 (six) hours as needed for severe pain. What changed:   when to take this  reasons to take this   pantoprazole 40 MG tablet Commonly known as: PROTONIX Take 40 mg by mouth daily.   simvastatin 20 MG tablet Commonly known as: ZOCOR Take 20 mg by mouth daily.   traMADol 50 MG tablet Commonly known as: ULTRAM Take 1-2 tablets (50-100 mg total) by mouth every 6 (six) hours as needed for moderate pain.   vitamin B-12 500 MCG tablet Commonly known as:  CYANOCOBALAMIN Take 500 mcg by mouth daily.   vitamin E 180 MG (400 UNITS) capsule Take 400 Units by mouth daily.            Discharge Care Instructions  (From admission, onward)         Start     Ordered   06/04/20 0000  Weight bearing as tolerated        06/04/20 0724   06/04/20 0000  Change dressing       Comments: You may remove the bulky bandage (ACE wrap and gauze) two days after surgery. You will have an adhesive waterproof bandage underneath. Leave this in place until your first follow-up appointment.   06/04/20 0724          Follow-up Information    Gaynelle Arabian, MD. Schedule an appointment as soon as possible for a visit in 2 week(s).   Specialty: Orthopedic Surgery Contact information: [address] [place] [address] [phone]        [facility] Follow up.   Specialty: Home Health Services Why: This agency will provide home physical therapy. The agency will contact you prior to the first visit.  Contact information: [place] [address] [phone]               Signed: Theresa Duty, PA-C Orthopedic Surgery 06/10/2020, 1:37 PM

## 2020-06-12 ENCOUNTER — Encounter (HOSPITAL_COMMUNITY): Payer: Medicare HMO | Admitting: Physical Therapy

## 2020-06-14 ENCOUNTER — Encounter (HOSPITAL_COMMUNITY): Payer: Medicare HMO

## 2020-06-17 ENCOUNTER — Ambulatory Visit (HOSPITAL_COMMUNITY): Payer: Medicare HMO | Admitting: Physical Therapy

## 2020-06-19 ENCOUNTER — Encounter (HOSPITAL_COMMUNITY): Payer: Medicare HMO | Admitting: Physical Therapy

## 2020-06-20 ENCOUNTER — Encounter (HOSPITAL_COMMUNITY): Payer: Medicare HMO | Admitting: Physical Therapy

## 2020-06-24 ENCOUNTER — Ambulatory Visit (HOSPITAL_COMMUNITY): Payer: No Typology Code available for payment source | Attending: Student | Admitting: Physical Therapy

## 2020-06-24 ENCOUNTER — Encounter (HOSPITAL_COMMUNITY): Payer: Self-pay | Admitting: Physical Therapy

## 2020-06-24 ENCOUNTER — Other Ambulatory Visit: Payer: Self-pay

## 2020-06-24 DIAGNOSIS — R2689 Other abnormalities of gait and mobility: Secondary | ICD-10-CM | POA: Diagnosis present

## 2020-06-24 DIAGNOSIS — M25562 Pain in left knee: Secondary | ICD-10-CM

## 2020-06-24 DIAGNOSIS — M25662 Stiffness of left knee, not elsewhere classified: Secondary | ICD-10-CM

## 2020-06-24 DIAGNOSIS — M6281 Muscle weakness (generalized): Secondary | ICD-10-CM | POA: Diagnosis present

## 2020-06-24 NOTE — Patient Instructions (Signed)
Access Code: [email or web address] URL: [email or web address] Date: 06/24/2020 Prepared by: Mitzi Hansen Vinia Jemmott  Exercises Supine Heel Slide with Strap - 3 x daily - 7 x weekly - 10 reps - 10 second hold Seated Long Arc Quad - 1 x daily - 7 x weekly - 10 reps - 10 hold

## 2020-06-24 NOTE — Therapy (Signed)
[place], [place], [address] Phone: [phone]   Fax:  [phone]  Physical Therapy Evaluation  Patient Details  Name: Jeremiah Kelley MRN: [id number] Date of Birth: 1946-02-02 Referring Provider (PT): Theresa Duty PA-C   Encounter Date: 06/24/2020   PT End of Session - 06/24/20 1450    Visit Number 1    Number of Visits 12    Date for PT Re-Evaluation 08/05/20    Authorization Type [employer]    Authorization Time Period 12 visits requested from 06/23/20 to 08/05/20 - check auth    Authorization - Visit Number 1    Authorization - Number of Visits 1    Progress Note Due on Visit 10    PT Start Time 1400    PT Stop Time 1445    PT Time Calculation (min) 45 min    Activity Tolerance Patient tolerated treatment well    Behavior During Therapy [facility] for tasks assessed/performed           Past Medical History:  Diagnosis Date  . Arthritis    osteoarthritis knees hips  . GERD (gastroesophageal reflux disease)   . Hyperlipidemia   . Hypertension   . Stroke [facility])     Past Surgical History:  Procedure Laterality Date  . JOINT REPLACEMENT     right knee  . KNEE SURGERY Right 2017  . TONSILLECTOMY     age 25  . TONSILLECTOMY    . TOTAL HIP ARTHROPLASTY Left 12/09/2016   Procedure: LEFT TOTAL HIP ARTHROPLASTY ANTERIOR APPROACH;  Surgeon: Gaynelle Arabian, MD;  Location: [facility];  Service: Orthopedics;  Laterality: Left;  . TOTAL KNEE ARTHROPLASTY Left 06/03/2020   Procedure: TOTAL KNEE ARTHROPLASTY;  Surgeon: Gaynelle Arabian, MD;  Location: [facility];  Service: Orthopedics;  Laterality: Left;  88min  . WRIST SURGERY Left     There were no vitals filed for this visit.    Subjective Assessment - 06/24/20 1402    Subjective Patient is a 74 y.o. male who presents to physical therapy s/p L TKA on 06/03/20. Patient states he has had some bad nights sleeping. He had some home health PT for several visits. He  is currently limited with walking, transfers, and stairs. His main goal to get back at least 90% strength and movement.    Limitations Walking;Standing;House hold activities    Patient Stated Goals to get back at least 90% strength and movement    Currently in Pain? No/denies   worst 4-5/10             [facility] PT Assessment - 06/24/20 0001      Assessment   Medical Diagnosis L TKA    Referring Provider (PT) Theresa Duty PA-C    Onset Date/Surgical Date 06/03/20    Next MD Visit Jul 09, 2020    Prior Therapy yes      Restrictions   Weight Bearing Restrictions No      Balance Screen   Has the patient fallen in the past 6 months No    Has the patient had a decrease in activity level because of a fear of falling?  No    Is the patient reluctant to leave their home because of a fear of falling?  No      Prior Function   Level of Independence Independent    Vocation Retired      Associate Professor   Overall Cognitive Status Within Functional Limits for tasks assessed  Observation/Other Assessments   Observations Ambulates with SPC, analgic gait, limited knee flexion/ext    Focus on Therapeutic Outcomes (FOTO)  complete next session      ROM / Strength   AROM / PROM / Strength AROM;Strength      AROM   AROM Assessment Site Hip;Knee;Ankle    Right/Left Knee Right;Left    Left Knee Extension 9   lacking   Left Knee Flexion 97      Strength   Strength Assessment Site Hip;Knee;Ankle    Right/Left Hip Right;Left    Right Hip Flexion 5/5    Left Hip Flexion 5/5    Right/Left Knee Right;Left    Right Knee Flexion 5/5    Right Knee Extension 5/5    Left Knee Flexion 5/5    Left Knee Extension 4+/5    Right/Left Ankle Right;Left    Right Ankle Dorsiflexion 5/5    Left Ankle Dorsiflexion 5/5      Palpation   Patella mobility WFL, limited by edema    Palpation comment edema throughout knee      Transfers   Comments relies on RLE      Ambulation/Gait   Ambulation/Gait  Yes    Ambulation Distance (Feet) 226 Feet    Assistive device None    Gait Pattern Decreased hip/knee flexion - left;Left foot flat;Left flexed knee in stance;Antalgic    Ambulation Surface Level;Indoor    Gait velocity decreased    Gait Comments 2MWT, fatigued and stopped at 1:45                      Objective measurements completed on examination: See above findings.       [facility] Adult PT Treatment/Exercise - 06/24/20 0001      Exercises   Exercises Knee/Hip      Knee/Hip Exercises: Aerobic   Recumbent Bike 5 minutes rocking for ROM      Knee/Hip Exercises: Seated   Long Arc Quad Left;10 reps    Long Arc Quad Limitations 5-10 second holds      Knee/Hip Exercises: Supine   Heel Slides 10 reps    Heel Slides Limitations 5-10 second holds                  PT Education - 06/24/20 1356    Education Details Patient educated on exam findings, POC, scope of PT, HEP, heel prop for ext., elevation for edema    Person(s) Educated Patient    Methods Explanation;Demonstration;Handout    Comprehension Verbalized understanding;Returned demonstration            PT Short Term Goals - 06/24/20 1459      PT SHORT TERM GOAL #1   Title Patient will be independent with HEP in order to improve functional outcomes.    Time 3    Period Weeks    Status New    Target Date 07/15/20      PT SHORT TERM GOAL #2   Title Patient will report at least 25% improvement in symptoms for improved quality of life.    Time 3    Period Weeks    Status New    Target Date 07/15/20             PT Long Term Goals - 06/24/20 1459      PT LONG TERM GOAL #1   Title Patient will report at least 75% improvement in symptoms for improved quality of life.    Time  6    Period Weeks    Status New    Target Date 08/05/20      PT LONG TERM GOAL #2   Title Patient will improve FOTO score by at least 10 points in order to indicate improved tolerance to activity.    Time 6     Period Weeks    Status New    Target Date 08/05/20      PT LONG TERM GOAL #3   Title Patient will be able to ambulate at least 400 feet during 2MWT with LRAD to demonstrate improved ability to perform functional mobility and associated tasks.    Time 6    Period Weeks    Status New    Target Date 08/05/20      PT LONG TERM GOAL #4   Title Patient will be able to navigate stairs with reciprocal pattern without compensation in order to demonstrate improved LE strength.    Time 6    Period Weeks    Status New    Target Date 08/05/20                  Plan - 06/24/20 1453    Clinical Impression Statement Patient is a 74 y.o. male who presents to physical therapy s/p L TKA on 06/03/20. He presents with pain limited deficits in L knee strength, ROM, endurance, gait, transfers, edema, and functional mobility with ADL. He is having to modify and restrict ADL as indicated by subjective information and objective measures which is affecting overall participation. Patient will benefit from skilled physical therapy in order to improve function and reduce impairment.    Personal Factors and Comorbidities Age;Past/Current Experience;Behavior Pattern    Examination-Activity Limitations Sleep;Bed Mobility;Bend;Squat;Lift;Locomotion Level;Stand;Transfers;Stairs    Examination-Participation Restrictions Cleaning;Community Activity;Yard Work;Shop;Volunteer    Stability/Clinical Decision Making Stable/Uncomplicated    Clinical Decision Making Low    Rehab Potential Good    PT Frequency 2x / week    PT Duration 6 weeks    PT Treatment/Interventions ADLs/Self Care Home Management;Aquatic Therapy;Biofeedback;Canalith Repostioning;Cryotherapy;Electrical Stimulation;Traction;Moist Heat;Neuromuscular re-education;DME Instruction;Gait training;Stair training;Functional mobility training;Therapeutic activities;Therapeutic exercise;Balance training;Patient/family education;Manual techniques;Manual lymph  drainage;Compression bandaging;Scar mobilization;Passive range of motion;Dry needling;Energy conservation;Splinting;Taping;Vasopneumatic Device;Spinal Manipulations;Joint Manipulations    PT Next Visit Plan complete FOTO if it is added, manual for edema, improve L knee ROM and strength and progress as tolerated, possibly continue with bike and heel slides, add SLR, TKE, steps, STS    PT Home Exercise Plan 12/27 LAQ, heel slides    Consulted and Agree with Plan of Care Patient           Patient will benefit from skilled therapeutic intervention in order to improve the following deficits and impairments:  Decreased endurance,Abnormal gait,Decreased activity tolerance,Decreased balance,Difficulty walking,Decreased strength,Pain,Increased muscle spasms,Improper body mechanics,Impaired flexibility,Decreased range of motion  Visit Diagnosis: Left knee pain, unspecified chronicity  Muscle weakness (generalized)  Other abnormalities of gait and mobility  Stiffness of left knee, not elsewhere classified     Problem List Patient Active Problem List   Diagnosis Date Noted  . Primary osteoarthritis of left knee 06/03/2020  . HLD (hyperlipidemia) 09/17/2016  . Essential hypertension 09/17/2016  . Stroke ([facility]) 09/17/2016  . Chronic GERD 09/17/2016  . OA (osteoarthritis) of knee 09/17/2016  . Knee joint replacement status, right 09/17/2016  . Colon polyp 09/17/2016    3:02 PM, 06/24/20 Mearl Latin PT, DPT Physical Therapist at [facility]  [address] [place], [place], [address] Phone: [phone]   Fax:  [phone]  Name: Jeremiah Kelley MRN: [id number] Date of Birth: 1945-10-18

## 2020-06-26 ENCOUNTER — Ambulatory Visit (HOSPITAL_COMMUNITY): Payer: No Typology Code available for payment source | Admitting: Physical Therapy

## 2020-06-26 ENCOUNTER — Other Ambulatory Visit: Payer: Self-pay

## 2020-06-26 DIAGNOSIS — M25662 Stiffness of left knee, not elsewhere classified: Secondary | ICD-10-CM

## 2020-06-26 DIAGNOSIS — R2689 Other abnormalities of gait and mobility: Secondary | ICD-10-CM

## 2020-06-26 DIAGNOSIS — M6281 Muscle weakness (generalized): Secondary | ICD-10-CM

## 2020-06-26 DIAGNOSIS — M25562 Pain in left knee: Secondary | ICD-10-CM | POA: Diagnosis not present

## 2020-06-26 NOTE — Therapy (Signed)
[facility] [address] [place], [place], [address] Phone: [phone]   Fax:  [phone]  Physical Therapy Treatment  Patient Details  Name: Jeremiah Kelley MRN: [id number] Date of Birth: December 06, 1945 Referring Provider (PT): Arther Abbott PA-C   Encounter Date: 06/26/2020   PT End of Session - 06/26/20 1401    Visit Number 2    Number of Visits 12    Date for PT Re-Evaluation 08/05/20    Authorization Type VA    Authorization Time Period VA approved PT 15 visits    Authorization - Visit Number 2    Authorization - Number of Visits 15    Progress Note Due on Visit 10    PT Start Time 1401    PT Stop Time 1445    PT Time Calculation (min) 44 min    Activity Tolerance Patient tolerated treatment well    Behavior During Therapy WFL for tasks assessed/performed           Past Medical History:  Diagnosis Date   Arthritis    osteoarthritis knees hips   GERD (gastroesophageal reflux disease)    Hyperlipidemia    Hypertension    Stroke [facility])     Past Surgical History:  Procedure Laterality Date   JOINT REPLACEMENT     right knee   KNEE SURGERY Right 2017   TONSILLECTOMY     age 88   TONSILLECTOMY     TOTAL HIP ARTHROPLASTY Left 12/09/2016   Procedure: LEFT TOTAL HIP ARTHROPLASTY ANTERIOR APPROACH;  Surgeon: Ollen Gross, MD;  Location: [facility];  Service: Orthopedics;  Laterality: Left;   TOTAL KNEE ARTHROPLASTY Left 06/03/2020   Procedure: TOTAL KNEE ARTHROPLASTY;  Surgeon: Ollen Gross, MD;  Location: [facility];  Service: Orthopedics;  Laterality: Left;    WRIST SURGERY Left     There were no vitals filed for this visit.   Subjective Assessment - 06/26/20 1401    Subjective Patient states that he is warn out today from helping is wife. He has been standing alot.    Limitations Walking;Standing;House hold activities    Patient Stated Goals to get back at least 90% strength and movement    Currently  in Pain? Yes    Pain Score 3     Pain Location Knee    Pain Orientation Left    Pain Type Surgical pain                             [facility] Adult PT Treatment/Exercise - 06/26/20 0001      Knee/Hip Exercises: Stretches   Gastroc Stretch 3 reps;30 seconds    Gastroc Stretch Limitations slant board      Knee/Hip Exercises: Aerobic   Recumbent Bike 5 minutes rocking for ROM      Knee/Hip Exercises: Standing   Heel Raises 10 reps    Heel Raises Limitations TR 10x    Knee Flexion Both;20 reps    Hip Flexion 10 reps      Knee/Hip Exercises: Supine   Heel Slides 5 reps    Heel Slides Limitations 5-10 second holds    Bridges Both;10 reps;2 sets    Bridges Limitations cueing for glute activation    Straight Leg Raises Left;2 sets;10 reps    Knee Extension AROM    Knee Extension Limitations lacking 7    Knee Flexion AROM    Knee Flexion Limitations 103   110  following manual therapy     Knee/Hip Exercises: Sidelying   Hip ABduction Left;1 set;10 reps      Manual Therapy   Manual Therapy Edema management    Manual therapy comments all manual interventions performed indepedently of other interventions    Edema Management retrograde massage with L propped for edema reduction                  PT Education - 06/26/20 1405    Education Details Patient educated on HEP, exercise mechanics    Person(s) Educated Patient    Methods Explanation;Demonstration    Comprehension Verbalized understanding;Returned demonstration            PT Short Term Goals - 06/24/20 1459      PT SHORT TERM GOAL #1   Title Patient will be independent with HEP in order to improve functional outcomes.    Time 3    Period Weeks    Status New    Target Date 07/15/20      PT SHORT TERM GOAL #2   Title Patient will report at least 25% improvement in symptoms for improved quality of life.    Time 3    Period Weeks    Status New    Target Date 07/15/20             PT  Long Term Goals - 06/24/20 1459      PT LONG TERM GOAL #1   Title Patient will report at least 75% improvement in symptoms for improved quality of life.    Time 6    Period Weeks    Status New    Target Date 08/05/20      PT LONG TERM GOAL #2   Title Patient will improve FOTO score by at least 10 points in order to indicate improved tolerance to activity.    Time 6    Period Weeks    Status New    Target Date 08/05/20      PT LONG TERM GOAL #3   Title Patient will be able to ambulate at least 400 feet during 2MWT with LRAD to demonstrate improved ability to perform functional mobility and associated tasks.    Time 6    Period Weeks    Status New    Target Date 08/05/20      PT LONG TERM GOAL #4   Title Patient will be able to navigate stairs with reciprocal pattern without compensation in order to demonstrate improved LE strength.    Time 6    Period Weeks    Status New    Target Date 08/05/20                 Plan - 06/26/20 1401    Clinical Impression Statement Patient begins on bike for dynamic warm up with partial revolutions with cueing for holds on end range. Patient showing improving ROM from lacking 7 to 103 today following bike. Performed retrograde massage for excessive knee edema today which assisted with pain reduction. Patient ROM improves to 110 degrees flexion following manual therapy and heel slides. Patient able to complete SLR without extensor lag today but is lacking end range extension. Patient with fatigue today overall but is able to complete exercises with good mechanics with minimal cueing. Patient will continue to benefit from skilled physical therapy in order to reduce impairment and improve function.    Personal Factors and Comorbidities Age;Past/Current Experience;Behavior Pattern    Examination-Activity Limitations Sleep;Bed Mobility;Bend;Squat;Lift;Locomotion  Level;Stand;Transfers;Stairs    Examination-Participation Restrictions  Cleaning;Community Activity;Yard Work;Shop;Volunteer    Stability/Clinical Decision Making Stable/Uncomplicated    Rehab Potential Good    PT Frequency 2x / week    PT Duration 6 weeks    PT Treatment/Interventions ADLs/Self Care Home Management;Aquatic Therapy;Biofeedback;Canalith Repostioning;Cryotherapy;Electrical Stimulation;Traction;Moist Heat;Neuromuscular re-education;DME Instruction;Gait training;Stair training;Functional mobility training;Therapeutic activities;Therapeutic exercise;Balance training;Patient/family education;Manual techniques;Manual lymph drainage;Compression bandaging;Scar mobilization;Passive range of motion;Dry needling;Energy conservation;Splinting;Taping;Vasopneumatic Device;Spinal Manipulations;Joint Manipulations    PT Next Visit Plan complete FOTO if it is added, manual for edema, improve L knee ROM and strength and progress as tolerated, possibly continue with bike and heel slides, SLR, TKE, steps, STS    PT Home Exercise Plan 12/27 LAQ, heel slides 12/29 SLR and continue HHPT exercises    Consulted and Agree with Plan of Care Patient           Patient will benefit from skilled therapeutic intervention in order to improve the following deficits and impairments:  Decreased endurance,Abnormal gait,Decreased activity tolerance,Decreased balance,Difficulty walking,Decreased strength,Pain,Increased muscle spasms,Improper body mechanics,Impaired flexibility,Decreased range of motion  Visit Diagnosis: Left knee pain, unspecified chronicity  Muscle weakness (generalized)  Other abnormalities of gait and mobility  Stiffness of left knee, not elsewhere classified     Problem List Patient Active Problem List   Diagnosis Date Noted   Primary osteoarthritis of left knee 06/03/2020   HLD (hyperlipidemia) 09/17/2016   Essential hypertension 09/17/2016   Stroke ([facility]) 09/17/2016   Chronic GERD 09/17/2016   OA (osteoarthritis) of knee 09/17/2016   Knee joint  replacement status, right 09/17/2016   Colon polyp 09/17/2016    3:00 PM, 06/26/20 Wyman Songster PT, DPT Physical Therapist at [facility]   [place] [facility] [address] [place], [place], [address] Phone: [phone]   Fax:  [phone]  Name: Jeremiah Kelley MRN: [id number] Date of Birth: [birth year]

## 2020-06-26 NOTE — Patient Instructions (Signed)
Access Code: [email or web address] URL: [email or web address] Date: 06/26/2020 Prepared by: Greig Castilla Danetra Glock  Exercises Supine Active Straight Leg Raise - 1 x daily - 7 x weekly - 2 sets - 10 reps

## 2020-06-27 ENCOUNTER — Ambulatory Visit (HOSPITAL_COMMUNITY): Payer: No Typology Code available for payment source | Admitting: Physical Therapy

## 2020-06-28 DIAGNOSIS — M19041 Primary osteoarthritis, right hand: Secondary | ICD-10-CM | POA: Diagnosis not present

## 2020-06-28 DIAGNOSIS — I10 Essential (primary) hypertension: Secondary | ICD-10-CM | POA: Diagnosis not present

## 2020-06-28 DIAGNOSIS — G4700 Insomnia, unspecified: Secondary | ICD-10-CM | POA: Diagnosis not present

## 2020-06-28 DIAGNOSIS — M1990 Unspecified osteoarthritis, unspecified site: Secondary | ICD-10-CM | POA: Diagnosis not present

## 2020-06-28 DIAGNOSIS — M19042 Primary osteoarthritis, left hand: Secondary | ICD-10-CM | POA: Diagnosis not present

## 2020-06-28 DIAGNOSIS — E782 Mixed hyperlipidemia: Secondary | ICD-10-CM | POA: Diagnosis not present

## 2020-06-28 DIAGNOSIS — M67432 Ganglion, left wrist: Secondary | ICD-10-CM | POA: Diagnosis not present

## 2020-06-28 DIAGNOSIS — R7301 Impaired fasting glucose: Secondary | ICD-10-CM | POA: Diagnosis not present

## 2020-06-28 DIAGNOSIS — K219 Gastro-esophageal reflux disease without esophagitis: Secondary | ICD-10-CM | POA: Diagnosis not present

## 2020-07-01 ENCOUNTER — Other Ambulatory Visit: Payer: Self-pay

## 2020-07-01 ENCOUNTER — Encounter (HOSPITAL_COMMUNITY): Payer: Self-pay | Admitting: Physical Therapy

## 2020-07-01 ENCOUNTER — Ambulatory Visit (HOSPITAL_COMMUNITY): Payer: No Typology Code available for payment source | Attending: Student | Admitting: Physical Therapy

## 2020-07-01 DIAGNOSIS — M25562 Pain in left knee: Secondary | ICD-10-CM | POA: Diagnosis present

## 2020-07-01 DIAGNOSIS — M25662 Stiffness of left knee, not elsewhere classified: Secondary | ICD-10-CM | POA: Diagnosis present

## 2020-07-01 DIAGNOSIS — M6281 Muscle weakness (generalized): Secondary | ICD-10-CM | POA: Insufficient documentation

## 2020-07-01 DIAGNOSIS — R2689 Other abnormalities of gait and mobility: Secondary | ICD-10-CM | POA: Diagnosis present

## 2020-07-01 NOTE — Therapy (Signed)
[facility] [address] [place], [place], [address] Phone: [phone]   Fax:  [phone]  Physical Therapy Treatment  Patient Details  Name: Jeremiah Kelley MRN: [id number] Date of Birth: 1946/06/22 Referring Provider (PT): Arther Abbott PA-C   Encounter Date: 07/01/2020   PT End of Session - 07/01/20 1001    Visit Number 3    Number of Visits 12    Date for PT Re-Evaluation 08/05/20    Authorization Type VA    Authorization Time Period VA approved PT 15 visits    Authorization - Visit Number 3    Authorization - Number of Visits 15    Progress Note Due on Visit 10    PT Start Time 1001    PT Stop Time 1040    PT Time Calculation (min) 39 min    Activity Tolerance Patient tolerated treatment well    Behavior During Therapy [facility] for tasks assessed/performed           Past Medical History:  Diagnosis Date  . Arthritis    osteoarthritis knees hips  . GERD (gastroesophageal reflux disease)   . Hyperlipidemia   . Hypertension   . Stroke [facility])     Past Surgical History:  Procedure Laterality Date  . JOINT REPLACEMENT     right knee  . KNEE SURGERY Right 2017  . TONSILLECTOMY     age 26  . TONSILLECTOMY    . TOTAL HIP ARTHROPLASTY Left 12/09/2016   Procedure: LEFT TOTAL HIP ARTHROPLASTY ANTERIOR APPROACH;  Surgeon: Ollen Gross, MD;  Location: [facility];  Service: Orthopedics;  Laterality: Left;  . TOTAL KNEE ARTHROPLASTY Left 06/03/2020   Procedure: TOTAL KNEE ARTHROPLASTY;  Surgeon: Ollen Gross, MD;  Location: [facility];  Service: Orthopedics;  Laterality: Left;   . WRIST SURGERY Left     There were no vitals filed for this visit.   Subjective Assessment - 07/01/20 1009    Subjective States he has been having some pain, almost hypersensitivity along the front of his shin. States that he has 1/10 pain at rest and has no pain on bike.    Limitations Walking;Standing;House hold activities    Patient Stated Goals  to get back at least 90% strength and movement    Currently in Pain? Yes    Pain Location Knee    Pain Orientation Left    Pain Descriptors / Indicators Tightness              [facility] PT Assessment - 07/01/20 0001      Assessment   Medical Diagnosis L TKA    Referring Provider (PT) Arther Abbott PA-C    Next MD Visit Jul 09, 2020      Observation/Other Assessments   Observations swelling throughout leg      Special Tests   Other special tests negative homa's sign on left                         [facility] Adult PT Treatment/Exercise - 07/01/20 0001      Ambulation/Gait   Stairs Yes    Stairs Assistance 6: Modified independent (Device/Increase time);5: Supervision    Stair Management Technique Two rails;Alternating pattern    Number of Stairs 4    Height of Stairs 7      Knee/Hip Exercises: Stretches   Active Hamstring Stretch Left;4 reps;30 seconds   on step   Gastroc Stretch 4 reps;Left;30 seconds  slant board     Knee/Hip Exercises: Aerobic   Recumbent Bike 5 minutes - able to complete full revolutions seat #17      Knee/Hip Exercises: Seated   Long Arc Quad Left;10 reps   5" holds     Knee/Hip Exercises: Supine   Heel Slides Left;5 reps   20 second holds in each position   Knee Extension AROM;Left    Knee Extension Limitations lacking 8    Knee Flexion AROM;Left    Knee Flexion Limitations 105      Manual Therapy   Manual Therapy Edema management;Joint mobilization    Manual therapy comments all manual interventions performed indepedently of other interventions    Edema Management retrograde massage with L propped for edema reduction    Joint Mobilization left patella mobilization - tolerated well                  PT Education - 07/01/20 1019    Education Details on benefits of compression garments, measured and provided aptietn with measurements.    Person(s) Educated Patient    Methods Explanation    Comprehension Verbalized  understanding            PT Short Term Goals - 06/24/20 1459      PT SHORT TERM GOAL #1   Title Patient will be independent with HEP in order to improve functional outcomes.    Time 3    Period Weeks    Status New    Target Date 07/15/20      PT SHORT TERM GOAL #2   Title Patient will report at least 25% improvement in symptoms for improved quality of life.    Time 3    Period Weeks    Status New    Target Date 07/15/20             PT Long Term Goals - 06/24/20 1459      PT LONG TERM GOAL #1   Title Patient will report at least 75% improvement in symptoms for improved quality of life.    Time 6    Period Weeks    Status New    Target Date 08/05/20      PT LONG TERM GOAL #2   Title Patient will improve FOTO score by at least 10 points in order to indicate improved tolerance to activity.    Time 6    Period Weeks    Status New    Target Date 08/05/20      PT LONG TERM GOAL #3   Title Patient will be able to ambulate at least 400 feet during with LRAD to demonstrate improved ability to perform functional mobility and associated tasks.    Time 6    Period Weeks    Status New    Target Date 08/05/20      PT LONG TERM GOAL #4   Title Patient will be able to navigate stairs with reciprocal pattern without compensation in order to demonstrate improved LE strength.    Time 6    Period Weeks    Status New    Target Date 08/05/20                 Plan - 07/01/20 1001    Clinical Impression Statement Continued swelling throughout leg. Educated patient in benefit of compression garments and measured patient to obtain garments to wear all the time. Continued with ROM exercises and edema massage. Will follow up with use  of compression garments next session. Able to ascend and descend stairs with cane and use of railing with step through gait pattern on this date.    Personal Factors and Comorbidities Age;Past/Current Experience;Behavior Pattern     Examination-Activity Limitations Sleep;Bed Mobility;Bend;Squat;Lift;Locomotion Level;Stand;Transfers;Stairs    Examination-Participation Restrictions Cleaning;Community Activity;Yard Work;Shop;Volunteer    Stability/Clinical Decision Making Stable/Uncomplicated    Rehab Potential Good    PT Frequency 2x / week    PT Duration 6 weeks    PT Treatment/Interventions ADLs/Self Care Home Management;Aquatic Therapy;Biofeedback;Canalith Repostioning;Cryotherapy;Electrical Stimulation;Traction;Moist Heat;Neuromuscular re-education;DME Instruction;Gait training;Stair training;Functional mobility training;Therapeutic activities;Therapeutic exercise;Balance training;Patient/family education;Manual techniques;Manual lymph drainage;Compression bandaging;Scar mobilization;Passive range of motion;Dry needling;Energy conservation;Splinting;Taping;Vasopneumatic Device;Spinal Manipulations;Joint Manipulations    PT Next Visit Plan complete FOTO if it is added, manual for edema, improve L knee ROM and strength and progress as tolerated, possibly continue with bike and heel slides, SLR, TKE, steps, STS    PT Home Exercise Plan 12/27 LAQ, heel slides 12/29 SLR and continue HHPT exercises    Consulted and Agree with Plan of Care Patient           Patient will benefit from skilled therapeutic intervention in order to improve the following deficits and impairments:  Decreased endurance,Abnormal gait,Decreased activity tolerance,Decreased balance,Difficulty walking,Decreased strength,Pain,Increased muscle spasms,Improper body mechanics,Impaired flexibility,Decreased range of motion  Visit Diagnosis: Left knee pain, unspecified chronicity  Muscle weakness (generalized)  Other abnormalities of gait and mobility  Stiffness of left knee, not elsewhere classified     Problem List Patient Active Problem List   Diagnosis Date Noted  . Primary osteoarthritis of left knee 06/03/2020  . HLD (hyperlipidemia) 09/17/2016   . Essential hypertension 09/17/2016  . Stroke ([place]) 09/17/2016  . Chronic GERD 09/17/2016  . OA (osteoarthritis) of knee 09/17/2016  . Knee joint replacement status, right 09/17/2016  . Colon polyp 09/17/2016   10:45 AM, 07/01/20 Jerene Pitch, DPT Physical Therapy with [facility]  [phone] office  [place] [address] [place], [place], [address] Phone: [phone]   Fax:  [phone]  Name: Nezar Langer MRN: [id number] Date of Birth: Jun 01, 1946

## 2020-07-03 ENCOUNTER — Other Ambulatory Visit: Payer: Self-pay

## 2020-07-03 ENCOUNTER — Encounter (HOSPITAL_COMMUNITY): Payer: Self-pay

## 2020-07-03 ENCOUNTER — Ambulatory Visit (HOSPITAL_COMMUNITY): Payer: No Typology Code available for payment source

## 2020-07-03 DIAGNOSIS — R2689 Other abnormalities of gait and mobility: Secondary | ICD-10-CM

## 2020-07-03 DIAGNOSIS — M25662 Stiffness of left knee, not elsewhere classified: Secondary | ICD-10-CM

## 2020-07-03 DIAGNOSIS — M25562 Pain in left knee: Secondary | ICD-10-CM | POA: Diagnosis not present

## 2020-07-03 DIAGNOSIS — M6281 Muscle weakness (generalized): Secondary | ICD-10-CM

## 2020-07-03 NOTE — Therapy (Signed)
[place], [place], [address] Phone: [phone]   Fax:  [phone]  Physical Therapy Treatment  Patient Details  Name: Jeremiah Kelley MRN: [id number] Date of Birth: 09-Sep-1945 Referring Provider (PT): Theresa Duty PA-C   Encounter Date: 07/03/2020   PT End of Session - 07/03/20 1125    Visit Number 4    Number of Visits 12    Date for PT Re-Evaluation 08/05/20    Authorization Type VA    Authorization Time Period VA approved PT 15 visits    Authorization - Visit Number 4    Authorization - Number of Visits 15    Progress Note Due on Visit 10    PT Start Time 1120    PT Stop Time 1213    PT Time Calculation (min) 53 min    Activity Tolerance Patient tolerated treatment well    Behavior During Therapy [facility] for tasks assessed/performed           Past Medical History:  Diagnosis Date  . Arthritis    osteoarthritis knees hips  . GERD (gastroesophageal reflux disease)   . Hyperlipidemia   . Hypertension   . Stroke [facility])     Past Surgical History:  Procedure Laterality Date  . JOINT REPLACEMENT     right knee  . KNEE SURGERY Right 2017  . TONSILLECTOMY     age 75  . TONSILLECTOMY    . TOTAL HIP ARTHROPLASTY Left 12/09/2016   Procedure: LEFT TOTAL HIP ARTHROPLASTY ANTERIOR APPROACH;  Surgeon: Gaynelle Arabian, MD;  Location: [facility];  Service: Orthopedics;  Laterality: Left;  . TOTAL KNEE ARTHROPLASTY Left 06/03/2020   Procedure: TOTAL KNEE ARTHROPLASTY;  Surgeon: Gaynelle Arabian, MD;  Location: [facility];  Service: Orthopedics;  Laterality: Left;  27min  . WRIST SURGERY Left     There were no vitals filed for this visit.   Subjective Assessment - 07/03/20 1124    Subjective Pt arrived wiht compression hose on. repost some difficulty putting on.  Pain minimal 0-1/10 sharp pain in shin bothers him at night.  No real pain in knee today.    Patient Stated Goals to get back at least 90% strength and  movement    Currently in Pain? Yes    Pain Score 1     Pain Location Leg    Pain Orientation Distal;Anterior    Pain Descriptors / Indicators Sore;Tightness    Pain Type Surgical pain              [facility] PT Assessment - 07/03/20 0001      Assessment   Medical Diagnosis L TKA    Referring Provider (PT) Theresa Duty PA-C    Next MD Visit Jul 09, 2020                         [facility] Adult PT Treatment/Exercise - 07/03/20 0001      Knee/Hip Exercises: Stretches   Active Hamstring Stretch Left;3 reps;30 seconds    Active Hamstring Stretch Limitations supine with hands behind knee x1, belt assistance x 2    Knee: Self-Stretch to increase Flexion 5 reps;10 seconds    Knee: Self-Stretch Limitations knee drives for flexion on 12in step    Gastroc Stretch 3 reps;30 seconds    Gastroc Stretch Limitations slant board      Knee/Hip Exercises: Aerobic   Recumbent Bike 5 minutes - able to complete full revolutions seat 16  Knee/Hip Exercises: Standing   Heel Raises 10 reps    Heel Raises Limitations Toe and heel raises on incline slope    Terminal Knee Extension 10 reps;Theraband    Theraband Level (Terminal Knee Extension) Level 2 (Red)    Terminal Knee Extension Limitations 5" holds      Knee/Hip Exercises: Supine   Quad Sets 10 reps    Short Arc Quad Sets 15 reps    Short Arc Quad Sets Limitations 3" holds    Heel Slides 10 reps    Straight Leg Raises 10 reps    Straight Leg Raises Limitations quad set prior raise    Knee Extension AROM    Knee Extension Limitations lacking 7    Knee Flexion AROM;Left    Knee Flexion Limitations 106      Manual Therapy   Manual Therapy Edema management;Joint mobilization    Manual therapy comments all manual interventions performed indepedently of other interventions    Edema Management retrograde massage with L propped for edema reduction    Joint Mobilization left patella mobilization - tolerated well                   PT Education - 07/03/20 1206    Education Details Educated techniques to assist with donning compression garments, butler.  Discussed proper wear time.  Educated importance of posture.    Person(s) Educated Patient    Methods Explanation;Demonstration    Comprehension Verbalized understanding            PT Short Term Goals - 06/24/20 1459      PT SHORT TERM GOAL #1   Title Patient will be independent with HEP in order to improve functional outcomes.    Time 3    Period Weeks    Status New    Target Date 07/15/20      PT SHORT TERM GOAL #2   Title Patient will report at least 25% improvement in symptoms for improved quality of life.    Time 3    Period Weeks    Status New    Target Date 07/15/20             PT Long Term Goals - 06/24/20 1459      PT LONG TERM GOAL #1   Title Patient will report at least 75% improvement in symptoms for improved quality of life.    Time 6    Period Weeks    Status New    Target Date 08/05/20      PT LONG TERM GOAL #2   Title Patient will improve FOTO score by at least 10 points in order to indicate improved tolerance to activity.    Time 6    Period Weeks    Status New    Target Date 08/05/20      PT LONG TERM GOAL #3   Title Patient will be able to ambulate at least 400 feet during with LRAD to demonstrate improved ability to perform functional mobility and associated tasks.    Time 6    Period Weeks    Status New    Target Date 08/05/20      PT LONG TERM GOAL #4   Title Patient will be able to navigate stairs with reciprocal pattern without compensation in order to demonstrate improved LE strength.    Time 6    Period Weeks    Status New    Target Date 08/05/20  Plan - 07/03/20 1219    Clinical Impression Statement FOTO complete with 45% limitations.  Pt arrived with compression garments on, reports he slid them down at night as difficult to donn garment.  Pt educated on  importance of even compression for LE and educated on technqiues to assist with donning as well as shown butler to assist.  Session focus with ROM.  Added SAQ and standing TKE for extension and knee drives to assist with flexion.   Pt able to complete whole session without reports of pain.  Educated on importnace of posture to reduce pressure anterior knee, required reminding through session.  Continued wiht retrograde massage with LE elevated to assist with edema control.    Personal Factors and Comorbidities Age;Past/Current Experience;Behavior Pattern    Examination-Activity Limitations Sleep;Bed Mobility;Bend;Squat;Lift;Locomotion Level;Stand;Transfers;Stairs    Examination-Participation Restrictions Cleaning;Community Activity;Yard Work;Shop;Volunteer    Stability/Clinical Decision Making Stable/Uncomplicated    Clinical Decision Making Low    Rehab Potential Good    PT Frequency 2x / week    PT Duration 6 weeks    PT Treatment/Interventions ADLs/Self Care Home Management;Aquatic Therapy;Biofeedback;Canalith Repostioning;Cryotherapy;Electrical Stimulation;Traction;Moist Heat;Neuromuscular re-education;DME Instruction;Gait training;Stair training;Functional mobility training;Therapeutic activities;Therapeutic exercise;Balance training;Patient/family education;Manual techniques;Manual lymph drainage;Compression bandaging;Scar mobilization;Passive range of motion;Dry needling;Energy conservation;Splinting;Taping;Vasopneumatic Device;Spinal Manipulations;Joint Manipulations    PT Next Visit Plan Add thomas stretch to address hip flexor tightness.  Continue ROM based exercises and progress to strengthening as ROM improves.  Manual for edema PRN.  Continue bike, TKE, STS and progress to stair training as ROM improves.    PT Home Exercise Plan 12/27 LAQ, heel slides 12/29 SLR and continue HHPT exercises           Patient will benefit from skilled therapeutic intervention in order to improve the  following deficits and impairments:  Decreased endurance,Abnormal gait,Decreased activity tolerance,Decreased balance,Difficulty walking,Decreased strength,Pain,Increased muscle spasms,Improper body mechanics,Impaired flexibility,Decreased range of motion  Visit Diagnosis: Muscle weakness (generalized)  Other abnormalities of gait and mobility  Stiffness of left knee, not elsewhere classified  Left knee pain, unspecified chronicity     Problem List Patient Active Problem List   Diagnosis Date Noted  . Primary osteoarthritis of left knee 06/03/2020  . HLD (hyperlipidemia) 09/17/2016  . Essential hypertension 09/17/2016  . Stroke ([place]) 09/17/2016  . Chronic GERD 09/17/2016  . OA (osteoarthritis) of knee 09/17/2016  . Knee joint replacement status, right 09/17/2016  . Colon polyp 09/17/2016   Jeremiah Kelley, LPTA/CLT; CBIS [phone]  Jeremiah Kelley 07/03/2020, 12:26 PM  [place] [address] [place], [place], [address] Phone: [phone]   Fax:  [phone]  Name: Jeremiah Kelley MRN: [id number] Date of Birth: 1945-09-14

## 2020-07-05 ENCOUNTER — Ambulatory Visit (HOSPITAL_COMMUNITY): Payer: No Typology Code available for payment source

## 2020-07-08 ENCOUNTER — Other Ambulatory Visit: Payer: Self-pay

## 2020-07-08 ENCOUNTER — Ambulatory Visit (HOSPITAL_COMMUNITY): Payer: No Typology Code available for payment source | Admitting: Physical Therapy

## 2020-07-08 DIAGNOSIS — M25662 Stiffness of left knee, not elsewhere classified: Secondary | ICD-10-CM

## 2020-07-08 DIAGNOSIS — M6281 Muscle weakness (generalized): Secondary | ICD-10-CM

## 2020-07-08 DIAGNOSIS — M25562 Pain in left knee: Secondary | ICD-10-CM

## 2020-07-08 DIAGNOSIS — R2689 Other abnormalities of gait and mobility: Secondary | ICD-10-CM

## 2020-07-08 NOTE — Therapy (Signed)
[place], [place], [address] Phone: [phone]   Fax:  [phone]  Physical Therapy Treatment  Patient Details  Name: Jeremiah Kelley MRN: [id number] Date of Birth: [birth year] Referring Provider (PT): Theresa Duty PA-C   Encounter Date: 07/08/2020   PT End of Session - 07/08/20 1257    Visit Number 5    Number of Visits 12    Date for PT Re-Evaluation 08/05/20    Authorization Type VA    Authorization Time Period VA approved PT 15 visits    Authorization - Visit Number 5    Authorization - Number of Visits 15    Progress Note Due on Visit 10    PT Start Time 1138    PT Stop Time 1217    PT Time Calculation (min) 39 min    Activity Tolerance Patient tolerated treatment well    Behavior During Therapy [facility] for tasks assessed/performed           Past Medical History:  Diagnosis Date  . Arthritis    osteoarthritis knees hips  . GERD (gastroesophageal reflux disease)   . Hyperlipidemia   . Hypertension   . Stroke [facility])     Past Surgical History:  Procedure Laterality Date  . JOINT REPLACEMENT     right knee  . KNEE SURGERY Right 2017  . TONSILLECTOMY     age 89  . TONSILLECTOMY    . TOTAL HIP ARTHROPLASTY Left 12/09/2016   Procedure: LEFT TOTAL HIP ARTHROPLASTY ANTERIOR APPROACH;  Surgeon: Gaynelle Arabian, MD;  Location: [facility];  Service: Orthopedics;  Laterality: Left;  . TOTAL KNEE ARTHROPLASTY Left 06/03/2020   Procedure: TOTAL KNEE ARTHROPLASTY;  Surgeon: Gaynelle Arabian, MD;  Location: [facility];  Service: Orthopedics;  Laterality: Left;  89min  . WRIST SURGERY Left     There were no vitals filed for this visit.   Subjective Assessment - 07/08/20 1153    Subjective pt states his knee is feeling fine today, no pain.  doing his HEP.    Currently in Pain? No/denies                             [facility] Adult PT Treatment/Exercise - 07/08/20 0001      Knee/Hip Exercises:  Stretches   Active Hamstring Stretch Left;3 reps;30 seconds    Active Hamstring Stretch Limitations standing onto 12" step    Knee: Self-Stretch to increase Flexion 5 reps;10 seconds    Knee: Self-Stretch Limitations knee drives for flexion on 12in step    Gastroc Stretch 3 reps;30 seconds    Gastroc Stretch Limitations slant board      Knee/Hip Exercises: Aerobic   Recumbent Bike 5 minutes - able to complete full revolutions seat 16      Knee/Hip Exercises: Standing   Heel Raises 15 reps    Heel Raises Limitations Toe and heel raises on incline slope    Terminal Knee Extension Theraband;15 reps    Theraband Level (Terminal Knee Extension) Level 2 (Red)    Terminal Knee Extension Limitations 5" holds      Knee/Hip Exercises: Supine   Quad Sets 10 reps    Straight Leg Raises 10 reps    Straight Leg Raises Limitations quad set prior raise    Knee Extension AROM    Knee Extension Limitations lacking 5    Knee Flexion AROM;Left    Knee Flexion Limitations 106  Manual Therapy   Manual Therapy Edema management;Joint mobilization    Manual therapy comments all manual interventions performed indepedently of other interventions    Edema Management retrograde massage with L propped for edema reduction    Joint Mobilization left patella mobilization - tolerated well                    PT Short Term Goals - 06/24/20 1459      PT SHORT TERM GOAL #1   Title Patient will be independent with HEP in order to improve functional outcomes.    Time 3    Period Weeks    Status New    Target Date 07/15/20      PT SHORT TERM GOAL #2   Title Patient will report at least 25% improvement in symptoms for improved quality of life.    Time 3    Period Weeks    Status New    Target Date 07/15/20             PT Long Term Goals - 06/24/20 1459      PT LONG TERM GOAL #1   Title Patient will report at least 75% improvement in symptoms for improved quality of life.    Time 6     Period Weeks    Status New    Target Date 08/05/20      PT LONG TERM GOAL #2   Title Patient will improve FOTO score by at least 10 points in order to indicate improved tolerance to activity.    Time 6    Period Weeks    Status New    Target Date 08/05/20      PT LONG TERM GOAL #3   Title Patient will be able to ambulate at least 400 feet during 2MWT with LRAD to demonstrate improved ability to perform functional mobility and associated tasks.    Time 6    Period Weeks    Status New    Target Date 08/05/20      PT LONG TERM GOAL #4   Title Patient will be able to navigate stairs with reciprocal pattern without compensation in order to demonstrate improved LE strength.    Time 6    Period Weeks    Status New    Target Date 08/05/20                 Plan - 07/08/20 1303    Clinical Impression Statement Continued with primary focus on improving Lt knee AROM.  Full revolution on bike today, however attempted to move seat up one level closer and unable to complete full Rev.   ROM today towards end of session 5-106, no change in flexion but improvement in extension as compared to last visit.  Continued manual to LE as with tightness and adhesions perimeter of knee.  Pt reported overall improvement in mobility following manual and compliance with all activity/exercises outside of therapy.    Personal Factors and Comorbidities Age;Past/Current Experience;Behavior Pattern    Examination-Activity Limitations Sleep;Bed Mobility;Bend;Squat;Lift;Locomotion Level;Stand;Transfers;Stairs    Examination-Participation Restrictions Cleaning;Community Activity;Yard Work;Shop;Volunteer    Stability/Clinical Decision Making Stable/Uncomplicated    Rehab Potential Good    PT Frequency 2x / week    PT Duration 6 weeks    PT Treatment/Interventions ADLs/Self Care Home Management;Aquatic Therapy;Biofeedback;Canalith Repostioning;Cryotherapy;Electrical Stimulation;Traction;Moist Heat;Neuromuscular  re-education;DME Instruction;Gait training;Stair training;Functional mobility training;Therapeutic activities;Therapeutic exercise;Balance training;Patient/family education;Manual techniques;Manual lymph drainage;Compression bandaging;Scar mobilization;Passive range of motion;Dry needling;Energy conservation;Splinting;Taping;Vasopneumatic Device;Spinal Manipulations;Joint Manipulations  PT Next Visit Plan Focus on ROM based exercises and progress to strengthening as ROM improves.  Manual for edema PRN.    PT Home Exercise Plan 12/27 LAQ, heel slides 12/29 SLR and continue HHPT exercises           Patient will benefit from skilled therapeutic intervention in order to improve the following deficits and impairments:  Decreased endurance,Abnormal gait,Decreased activity tolerance,Decreased balance,Difficulty walking,Decreased strength,Pain,Increased muscle spasms,Improper body mechanics,Impaired flexibility,Decreased range of motion  Visit Diagnosis: Muscle weakness (generalized)  Other abnormalities of gait and mobility  Stiffness of left knee, not elsewhere classified  Left knee pain, unspecified chronicity     Problem List Patient Active Problem List   Diagnosis Date Noted  . Primary osteoarthritis of left knee 06/03/2020  . HLD (hyperlipidemia) 09/17/2016  . Essential hypertension 09/17/2016  . Stroke ([place]) 09/17/2016  . Chronic GERD 09/17/2016  . OA (osteoarthritis) of knee 09/17/2016  . Knee joint replacement status, right 09/17/2016  . Colon polyp 09/17/2016    Teena Irani, PTA/CLT [phone]   Teena Irani 07/08/2020, 1:05 PM  [place], [place], [address] Phone: [phone]   Fax:  [phone]  Name: Tashi Band MRN: [id number] Date of Birth: [birth year]

## 2020-07-10 ENCOUNTER — Other Ambulatory Visit: Payer: Self-pay

## 2020-07-10 ENCOUNTER — Ambulatory Visit (HOSPITAL_COMMUNITY): Payer: No Typology Code available for payment source | Admitting: Physical Therapy

## 2020-07-10 DIAGNOSIS — M25562 Pain in left knee: Secondary | ICD-10-CM | POA: Diagnosis not present

## 2020-07-10 DIAGNOSIS — M25662 Stiffness of left knee, not elsewhere classified: Secondary | ICD-10-CM

## 2020-07-10 DIAGNOSIS — M6281 Muscle weakness (generalized): Secondary | ICD-10-CM

## 2020-07-10 DIAGNOSIS — R2689 Other abnormalities of gait and mobility: Secondary | ICD-10-CM

## 2020-07-10 NOTE — Therapy (Signed)
[place], [place], [address] Phone: [phone]   Fax:  [phone]  Physical Therapy Treatment  Patient Details  Name: Jeremiah Kelley MRN: [id number] Date of Birth: [birth year] Referring Provider (PT): Theresa Duty PA-C   Encounter Date: 07/10/2020   PT End of Session - 07/10/20 1447    Visit Number 6    Number of Visits 12    Date for PT Re-Evaluation 08/05/20    Authorization Type VA    Authorization Time Period VA approved PT 15 visits    Authorization - Visit Number 6    Authorization - Number of Visits 15    Progress Note Due on Visit 10    PT Start Time 5188    PT Stop Time 1225    PT Time Calculation (min) 49 min    Activity Tolerance Patient tolerated treatment well    Behavior During Therapy [facility] for tasks assessed/performed           Past Medical History:  Diagnosis Date  . Arthritis    osteoarthritis knees hips  . GERD (gastroesophageal reflux disease)   . Hyperlipidemia   . Hypertension   . Stroke [facility])     Past Surgical History:  Procedure Laterality Date  . JOINT REPLACEMENT     right knee  . KNEE SURGERY Right 2017  . TONSILLECTOMY     age 75  . TONSILLECTOMY    . TOTAL HIP ARTHROPLASTY Left 12/09/2016   Procedure: LEFT TOTAL HIP ARTHROPLASTY ANTERIOR APPROACH;  Surgeon: Gaynelle Arabian, MD;  [facility];  Service: Orthopedics;  Laterality: Left;  . TOTAL KNEE ARTHROPLASTY Left 06/03/2020   Procedure: TOTAL KNEE ARTHROPLASTY;  Surgeon: Gaynelle Arabian, MD;  Location: [facility];  Service: Orthopedics;  Laterality: Left;  32min  . WRIST SURGERY Left     There were no vitals filed for this visit.   Subjective Assessment - 07/10/20 1142    Subjective pt states MD was very pleased with progress.  Recommended continued massage and lidocane patches for his nerve irritation.  Otherwise no pain or issues.                             [place] Adult PT  Treatment/Exercise - 07/10/20 0001      Knee/Hip Exercises: Stretches   Active Hamstring Stretch Left;3 reps;30 seconds    Active Hamstring Stretch Limitations standing onto 12" step    Knee: Self-Stretch to increase Flexion 10 seconds    Knee: Self-Stretch Limitations 10 reps knee drives for flexion on 12in step    Gastroc Stretch 3 reps;30 seconds    Gastroc Stretch Limitations slant board      Knee/Hip Exercises: Aerobic   Recumbent Bike 5 minutes - able to complete full revolutions after several backward reolutions seat 16      Knee/Hip Exercises: Standing   Lateral Step Up Left;Hand Hold: 1;10 reps;Step Height: 4"    Lateral Step Up Limitations slow and controlled    Forward Step Up Left;Hand Hold: 1;Step Height: 6";10 reps    Forward Step Up Limitations slow and controlled      Knee/Hip Exercises: Supine   Quad Sets 10 reps    Knee Extension AROM    Knee Extension Limitations lacking 5    Knee Flexion AROM;Left    Knee Flexion Limitations 108      Manual Therapy   Manual Therapy Edema management;Passive ROM  Manual therapy comments all manual interventions performed indepedently of other interventions    Edema Management retrograde massage with L propped for edema reduction    Passive ROM knee extension with OP 3X 5 sec holds                  PT Education - 07/10/20 1446    Education Details edcuated on compression stocking; how to don/doff; lidocane patch placement and assistance to don stocking afterward (pt unable to do this currently due to reduced knee flexion)    Person(s) Educated Patient    Methods Explanation;Demonstration;Tactile cues;Verbal cues    Comprehension Verbalized understanding            PT Short Term Goals - 06/24/20 1459      PT SHORT TERM GOAL #1   Title Patient will be independent with HEP in order to improve functional outcomes.    Time 3    Period Weeks    Status New    Target Date 07/15/20      PT SHORT TERM GOAL #2    Title Patient will report at least 25% improvement in symptoms for improved quality of life.    Time 3    Period Weeks    Status New    Target Date 07/15/20             PT Long Term Goals - 06/24/20 1459      PT LONG TERM GOAL #1   Title Patient will report at least 75% improvement in symptoms for improved quality of life.    Time 6    Period Weeks    Status New    Target Date 08/05/20      PT LONG TERM GOAL #2   Title Patient will improve FOTO score by at least 10 points in order to indicate improved tolerance to activity.    Time 6    Period Weeks    Status New    Target Date 08/05/20      PT LONG TERM GOAL #3   Title Patient will be able to ambulate at least 400 feet during 2MWT with LRAD to demonstrate improved ability to perform functional mobility and associated tasks.    Time 6    Period Weeks    Status New    Target Date 08/05/20      PT LONG TERM GOAL #4   Title Patient will be able to navigate stairs with reciprocal pattern without compensation in order to demonstrate improved LE strength.    Time 6    Period Weeks    Status New    Target Date 08/05/20                 Plan - 07/10/20 1450    Clinical Impression Statement began with warm up on bike and continued with stretches to improve knee mobility and general tightness perimeter of Lt knee.  Began slow controlled lateral and forward step ups to work through ROM with ability to complete with little cues and no pain.  Continued with manual to Lt knee with majority of adhesions lateral knee and scar tissue present anteriorly.  Educated on best way to don/doff compression stocking and assistaed with application of this following session as patient unable to get enough flexion in knee to don independently. Assisted with placement of lidocane patch as well for neerve pain.  Overall progresisng well with slowly improving ROM.    Personal Factors and Comorbidities Age;Past/Current Experience;Behavior Pattern  Examination-Activity Limitations Sleep;Bed Mobility;Bend;Squat;Lift;Locomotion Level;Stand;Transfers;Stairs    Examination-Participation Restrictions Cleaning;Community Activity;Yard Work;Shop;Volunteer    Stability/Clinical Decision Making Stable/Uncomplicated    Rehab Potential Good    PT Frequency 2x / week    PT Duration 6 weeks    PT Treatment/Interventions ADLs/Self Care Home Management;Aquatic Therapy;Biofeedback;Canalith Repostioning;Cryotherapy;Electrical Stimulation;Traction;Moist Heat;Neuromuscular re-education;DME Instruction;Gait training;Stair training;Functional mobility training;Therapeutic activities;Therapeutic exercise;Balance training;Patient/family education;Manual techniques;Manual lymph drainage;Compression bandaging;Scar mobilization;Passive range of motion;Dry needling;Energy conservation;Splinting;Taping;Vasopneumatic Device;Spinal Manipulations;Joint Manipulations    PT Next Visit Plan Focus on ROM based exercises and progress to strengthening as ROM improves.  Manual for ROM and soft tissue restrictions.    PT Home Exercise Plan 12/27 LAQ, heel slides 12/29 SLR and continue HHPT exercises           Patient will benefit from skilled therapeutic intervention in order to improve the following deficits and impairments:  Decreased endurance,Abnormal gait,Decreased activity tolerance,Decreased balance,Difficulty walking,Decreased strength,Pain,Increased muscle spasms,Improper body mechanics,Impaired flexibility,Decreased range of motion  Visit Diagnosis: Muscle weakness (generalized)  Other abnormalities of gait and mobility  Stiffness of left knee, not elsewhere classified  Left knee pain, unspecified chronicity     Problem List Patient Active Problem List   Diagnosis Date Noted  . Primary osteoarthritis of left knee 06/03/2020  . HLD (hyperlipidemia) 09/17/2016  . Essential hypertension 09/17/2016  . Stroke ([place]) 09/17/2016  . Chronic GERD 09/17/2016  .  OA (osteoarthritis) of knee 09/17/2016  . Knee joint replacement status, right 09/17/2016  . Colon polyp 09/17/2016   Teena Irani, PTA/CLT [phone]  Teena Irani 07/10/2020, 3:11 PM  [place], [place], [address] Phone: [phone]   Fax:  [phone]  Name: Collyn Stalvey MRN: [id number] Date of Birth: 18-Apr-1946

## 2020-07-12 ENCOUNTER — Encounter (HOSPITAL_COMMUNITY): Payer: Medicare HMO

## 2020-07-15 ENCOUNTER — Ambulatory Visit (HOSPITAL_COMMUNITY): Payer: No Typology Code available for payment source | Admitting: Physical Therapy

## 2020-07-17 ENCOUNTER — Other Ambulatory Visit: Payer: Self-pay

## 2020-07-17 ENCOUNTER — Ambulatory Visit (HOSPITAL_COMMUNITY): Payer: No Typology Code available for payment source | Admitting: Physical Therapy

## 2020-07-17 ENCOUNTER — Encounter (HOSPITAL_COMMUNITY): Payer: Self-pay | Admitting: Physical Therapy

## 2020-07-17 DIAGNOSIS — M6281 Muscle weakness (generalized): Secondary | ICD-10-CM

## 2020-07-17 DIAGNOSIS — R2689 Other abnormalities of gait and mobility: Secondary | ICD-10-CM

## 2020-07-17 DIAGNOSIS — M25662 Stiffness of left knee, not elsewhere classified: Secondary | ICD-10-CM

## 2020-07-17 DIAGNOSIS — M25562 Pain in left knee: Secondary | ICD-10-CM | POA: Diagnosis not present

## 2020-07-17 NOTE — Therapy (Signed)
[place], [place], [address] Phone: [phone]   Fax:  [phone]  Physical Therapy Treatment  Patient Details  Name: Jeremiah Kelley MRN: [id number] Date of Birth: [birth year] Referring Provider (PT): Theresa Duty PA-C   Encounter Date: 07/17/2020   PT End of Session - 07/17/20 1130    Visit Number 7    Number of Visits 12    Date for PT Re-Evaluation 08/05/20    Authorization Type VA    Authorization Time Period VA approved PT 15 visits    Authorization - Visit Number 7    Authorization - Number of Visits 15    Progress Note Due on Visit 10    PT Start Time 0626    PT Stop Time 1212    PT Time Calculation (min) 41 min    Activity Tolerance Patient tolerated treatment well    Behavior During Therapy [facility] for tasks assessed/performed           Past Medical History:  Diagnosis Date  . Arthritis    osteoarthritis knees hips  . GERD (gastroesophageal reflux disease)   . Hyperlipidemia   . Hypertension   . Stroke [facility])     Past Surgical History:  Procedure Laterality Date  . JOINT REPLACEMENT     right knee  . KNEE SURGERY Right 2017  . TONSILLECTOMY     age 11  . TONSILLECTOMY    . TOTAL HIP ARTHROPLASTY Left 12/09/2016   Procedure: LEFT TOTAL HIP ARTHROPLASTY ANTERIOR APPROACH;  Surgeon: Gaynelle Arabian, MD;  [facility]: [facility];  Service: Orthopedics;  Laterality: Left;  . TOTAL KNEE ARTHROPLASTY Left 06/03/2020   Procedure: TOTAL KNEE ARTHROPLASTY;  Surgeon: Gaynelle Arabian, MD;  [facility];  Service: Orthopedics;  Laterality: Left;  74min  . WRIST SURGERY Left     There were no vitals filed for this visit.   Subjective Assessment - 07/17/20 1133    Subjective Patient states nothing new with his knee. His home exercises are going alright.    Currently in Pain? No/denies                             [facility] Adult PT Treatment/Exercise - 07/17/20 0001      Knee/Hip  Exercises: Stretches   Other Knee/Hip Stretches prone quad stretch 3x30 second holds      Knee/Hip Exercises: Aerobic   Recumbent Bike 5 minutes - able to complete full revolutions after several backward reolutions seat 16, level 4      Knee/Hip Exercises: Machines for Strengthening   Cybex Leg Press 1x 20 5 plates      Knee/Hip Exercises: Standing   Heel Raises 15 reps    Heel Raises Limitations edge of step    Terminal Knee Extension 10 reps    Terminal Knee Extension Limitations blue thick band 5-10 second holds    Lateral Step Up Left;2 sets;10 reps;Hand Hold: 1;Step Height: 6"    Lateral Step Up Limitations eccentric control    Forward Step Up Left;Hand Hold: 1;Step Height: 6";10 reps    Forward Step Up Limitations slow and controlled    Functional Squat 2 sets;20 reps    Other Standing Knee Exercises lateral stepping 4x 15 feet      Knee/Hip Exercises: Seated   Long Arc Quad Left;10 reps    Long Arc Quad Weight 5 lbs.    Long [employer] Limitations 5-10  second holds      Knee/Hip Exercises: Supine   Knee Extension AROM    Knee Extension Limitations lacking 5    Knee Flexion AROM;Left    Knee Flexion Limitations 112                  PT Education - 07/17/20 1132    Education Details Patient educated on HEP, exercise mechanics    Person(s) Educated Patient    Methods Explanation;Demonstration    Comprehension Verbalized understanding;Returned demonstration            PT Short Term Goals - 06/24/20 1459      PT SHORT TERM GOAL #1   Title Patient will be independent with HEP in order to improve functional outcomes.    Time 3    Period Weeks    Status New    Target Date 07/15/20      PT SHORT TERM GOAL #2   Title Patient will report at least 25% improvement in symptoms for improved quality of life.    Time 3    Period Weeks    Status New    Target Date 07/15/20             PT Long Term Goals - 06/24/20 1459      PT LONG TERM GOAL #1   Title  Patient will report at least 75% improvement in symptoms for improved quality of life.    Time 6    Period Weeks    Status New    Target Date 08/05/20      PT LONG TERM GOAL #2   Title Patient will improve FOTO score by at least 10 points in order to indicate improved tolerance to activity.    Time 6    Period Weeks    Status New    Target Date 08/05/20      PT LONG TERM GOAL #3   Title Patient will be able to ambulate at least 400 feet during 2MWT with LRAD to demonstrate improved ability to perform functional mobility and associated tasks.    Time 6    Period Weeks    Status New    Target Date 08/05/20      PT LONG TERM GOAL #4   Title Patient will be able to navigate stairs with reciprocal pattern without compensation in order to demonstrate improved LE strength.    Time 6    Period Weeks    Status New    Target Date 08/05/20                 Plan - 07/17/20 1131    Clinical Impression Statement Patient begins on bike for dynamic warm up and ROM. Patient shows improving ROM from lacing 5 to 112. Patient with c/o hip flexor pain, completed prone hip flexor stretch but patient having hamstring cramping. Patient completes heel raises with very limited ankle ROM. Patient showing improving eccentric control with lateral step down and able to complete from increased step height. Patient with good squatting mechanics with min verbal cueing for mechanics with UE use. Patient continues to lack end range mobility and strength but is progressing well. Patient will continue to benefit from skilled physical therapy in order to reduce impairment and improve function.    Personal Factors and Comorbidities Age;Past/Current Experience;Behavior Pattern    Examination-Activity Limitations Sleep;Bed Mobility;Bend;Squat;Lift;Locomotion Level;Stand;Transfers;Stairs    Examination-Participation Restrictions Cleaning;Community Activity;Yard Work;Shop;Volunteer    Stability/Clinical Decision Making  Stable/Uncomplicated    Rehab  Potential Good    PT Frequency 2x / week    PT Duration 6 weeks    PT Treatment/Interventions ADLs/Self Care Home Management;Aquatic Therapy;Biofeedback;Canalith Repostioning;Cryotherapy;Electrical Stimulation;Traction;Moist Heat;Neuromuscular re-education;DME Instruction;Gait training;Stair training;Functional mobility training;Therapeutic activities;Therapeutic exercise;Balance training;Patient/family education;Manual techniques;Manual lymph drainage;Compression bandaging;Scar mobilization;Passive range of motion;Dry needling;Energy conservation;Splinting;Taping;Vasopneumatic Device;Spinal Manipulations;Joint Manipulations    PT Next Visit Plan Focus on ROM based exercises and progress to strengthening as ROM improves.  Manual for ROM and soft tissue restrictions.    PT Home Exercise Plan 12/27 LAQ, heel slides 12/29 SLR and continue HHPT exercises           Patient will benefit from skilled therapeutic intervention in order to improve the following deficits and impairments:  Decreased endurance,Abnormal gait,Decreased activity tolerance,Decreased balance,Difficulty walking,Decreased strength,Pain,Increased muscle spasms,Improper body mechanics,Impaired flexibility,Decreased range of motion  Visit Diagnosis: Left knee pain, unspecified chronicity  Stiffness of left knee, not elsewhere classified  Other abnormalities of gait and mobility  Muscle weakness (generalized)     Problem List Patient Active Problem List   Diagnosis Date Noted  . Primary osteoarthritis of left knee 06/03/2020  . HLD (hyperlipidemia) 09/17/2016  . Essential hypertension 09/17/2016  . Stroke ([place]) 09/17/2016  . Chronic GERD 09/17/2016  . OA (osteoarthritis) of knee 09/17/2016  . Knee joint replacement status, right 09/17/2016  . Colon polyp 09/17/2016    12:16 PM, 07/17/20 Mearl Latin PT, DPT Physical Therapist at [place], [place], [address] Phone: [phone]   Fax:  [phone]  Name: Jeremiah Kelley MRN: [id number] Date of Birth: 1945/12/04

## 2020-07-19 ENCOUNTER — Telehealth: Payer: Self-pay

## 2020-07-19 ENCOUNTER — Encounter (HOSPITAL_COMMUNITY): Payer: Medicare HMO

## 2020-07-19 NOTE — Telephone Encounter (Signed)
07/19/2020 Message received on the [place] hotline requesting homebound booster for pt and spouse, left voice message for return call. Murvin Natal, RN-BSN-CCM

## 2020-07-22 ENCOUNTER — Other Ambulatory Visit: Payer: Self-pay

## 2020-07-22 ENCOUNTER — Encounter (HOSPITAL_COMMUNITY): Payer: Self-pay | Admitting: Physical Therapy

## 2020-07-22 ENCOUNTER — Ambulatory Visit (HOSPITAL_COMMUNITY): Payer: No Typology Code available for payment source | Admitting: Physical Therapy

## 2020-07-22 DIAGNOSIS — M25662 Stiffness of left knee, not elsewhere classified: Secondary | ICD-10-CM

## 2020-07-22 DIAGNOSIS — M25562 Pain in left knee: Secondary | ICD-10-CM | POA: Diagnosis not present

## 2020-07-22 DIAGNOSIS — M6281 Muscle weakness (generalized): Secondary | ICD-10-CM

## 2020-07-22 DIAGNOSIS — R2689 Other abnormalities of gait and mobility: Secondary | ICD-10-CM

## 2020-07-22 NOTE — Therapy (Signed)
[place], [place], [address] Phone: [phone]   Fax:  [phone]  Physical Therapy Treatment  Patient Details  Name: Jeremiah Kelley MRN: [id number] Date of Birth: 01/23/1946 Referring Provider (PT): Theresa Duty PA-C   Encounter Date: 07/22/2020   PT End of Session - 07/22/20 1311    Visit Number 8    Number of Visits 12    Date for PT Re-Evaluation 08/05/20    Authorization Type VA    Authorization Time Period VA approved PT 15 visits    Authorization - Visit Number 8    Authorization - Number of Visits 15    Progress Note Due on Visit 10    PT Start Time 2263    PT Stop Time 1353    PT Time Calculation (min) 40 min    Activity Tolerance Patient tolerated treatment well    Behavior During Therapy [facility] for tasks assessed/performed           Past Medical History:  Diagnosis Date  . Arthritis    osteoarthritis knees hips  . GERD (gastroesophageal reflux disease)   . Hyperlipidemia   . Hypertension   . Stroke [facility])     Past Surgical History:  Procedure Laterality Date  . JOINT REPLACEMENT     right knee  . KNEE SURGERY Right 2017  . TONSILLECTOMY     age 18  . TONSILLECTOMY    . TOTAL HIP ARTHROPLASTY Left 12/09/2016   Procedure: LEFT TOTAL HIP ARTHROPLASTY ANTERIOR APPROACH;  Surgeon: Gaynelle Arabian, MD;  Location: [facility];  Service: Orthopedics;  Laterality: Left;  . TOTAL KNEE ARTHROPLASTY Left 06/03/2020   Procedure: TOTAL KNEE ARTHROPLASTY;  Surgeon: Gaynelle Arabian, MD;  Location: [facility];  Service: Orthopedics;  Laterality: Left;  34min  . WRIST SURGERY Left     There were no vitals filed for this visit.   Subjective Assessment - 07/22/20 1313    Subjective Patient states his hip was bothering after the leg press. The knee has been doing good.    Currently in Pain? No/denies                             [facility] Adult PT Treatment/Exercise - 07/22/20 0001       Knee/Hip Exercises: Aerobic   Recumbent Bike 5 minutes - able to complete full revolutions after several backward reolutions seat 15, level 4      Knee/Hip Exercises: Machines for Strengthening   Cybex Knee Extension 1 plate 1x 10    Cybex Leg Press 1x 20 5 plates      Knee/Hip Exercises: Standing   Heel Raises 15 reps    Heel Raises Limitations edge of step    Terminal Knee Extension 10 reps    Terminal Knee Extension Limitations blue thick band 5-10 second holds    Lateral Step Up Left;2 sets;10 reps;Hand Hold: 1;Step Height: 6"    Lateral Step Up Limitations eccentric control    Forward Step Up Left;Hand Hold: 1;Step Height: 6";15 reps;2 sets    Forward Step Up Limitations slow and controlled    Step Down Left;2 sets;10 reps;Hand Hold: 1;Step Height: 6"    Step Down Limitations eccentric control      Knee/Hip Exercises: Supine   Straight Leg Raises 15 reps;2 sets    Straight Leg Raises Limitations quad set prior raise, 4#    Knee Extension AROM  Knee Extension Limitations lacking 5    Knee Flexion AROM;Left    Knee Flexion Limitations 114                  PT Education - 07/22/20 1312    Education Details Patient educated on HEP, exercise mechanics    Person(s) Educated Patient    Methods Explanation;Demonstration    Comprehension Verbalized understanding;Returned demonstration            PT Short Term Goals - 06/24/20 1459      PT SHORT TERM GOAL #1   Title Patient will be independent with HEP in order to improve functional outcomes.    Time 3    Period Weeks    Status New    Target Date 07/15/20      PT SHORT TERM GOAL #2   Title Patient will report at least 25% improvement in symptoms for improved quality of life.    Time 3    Period Weeks    Status New    Target Date 07/15/20             PT Long Term Goals - 06/24/20 1459      PT LONG TERM GOAL #1   Title Patient will report at least 75% improvement in symptoms for improved quality of  life.    Time 6    Period Weeks    Status New    Target Date 08/05/20      PT LONG TERM GOAL #2   Title Patient will improve FOTO score by at least 10 points in order to indicate improved tolerance to activity.    Time 6    Period Weeks    Status New    Target Date 08/05/20      PT LONG TERM GOAL #3   Title Patient will be able to ambulate at least 400 feet during 2MWT with LRAD to demonstrate improved ability to perform functional mobility and associated tasks.    Time 6    Period Weeks    Status New    Target Date 08/05/20      PT LONG TERM GOAL #4   Title Patient will be able to navigate stairs with reciprocal pattern without compensation in order to demonstrate improved LE strength.    Time 6    Period Weeks    Status New    Target Date 08/05/20                 Plan - 07/22/20 1312    Clinical Impression Statement Patient with slightly improving knee flexion ROM from lacking 5 to 114 and continues to lack TKE. Patient ambulates with slightly crouched posture indicating shortened hip flexors. Educated patient on ways to improve hip flexor length. Patient given cueing for reducing UE support with stair exercises with good carry over. Patient showing improving eccentric  control with stair exercises and is able to progress to forward step downs. Patient overall progressing well but continues to lack end range mobility. Patient will continue to benefit from skilled physical therapy in order to improve function and reduce impairment.    Personal Factors and Comorbidities Age;Past/Current Experience;Behavior Pattern    Examination-Activity Limitations Sleep;Bed Mobility;Bend;Squat;Lift;Locomotion Level;Stand;Transfers;Stairs    Examination-Participation Restrictions Cleaning;Community Activity;Yard Work;Shop;Volunteer    Stability/Clinical Decision Making Stable/Uncomplicated    Rehab Potential Good    PT Frequency 2x / week    PT Duration 6 weeks    PT  Treatment/Interventions ADLs/Self Care Home Management;Aquatic Therapy;Biofeedback;Canalith  Repostioning;Cryotherapy;Electrical Stimulation;Traction;Moist Heat;Neuromuscular re-education;DME Instruction;Gait training;Stair training;Functional mobility training;Therapeutic activities;Therapeutic exercise;Balance training;Patient/family education;Manual techniques;Manual lymph drainage;Compression bandaging;Scar mobilization;Passive range of motion;Dry needling;Energy conservation;Splinting;Taping;Vasopneumatic Device;Spinal Manipulations;Joint Manipulations    PT Next Visit Plan Focus on ROM based exercises and progress to strengthening as ROM improves.  Manual for ROM and soft tissue restrictions.    PT Home Exercise Plan 12/27 LAQ, heel slides 12/29 SLR and continue HHPT exercises           Patient will benefit from skilled therapeutic intervention in order to improve the following deficits and impairments:  Decreased endurance,Abnormal gait,Decreased activity tolerance,Decreased balance,Difficulty walking,Decreased strength,Pain,Increased muscle spasms,Improper body mechanics,Impaired flexibility,Decreased range of motion  Visit Diagnosis: Left knee pain, unspecified chronicity  Stiffness of left knee, not elsewhere classified  Other abnormalities of gait and mobility  Muscle weakness (generalized)     Problem List Patient Active Problem List   Diagnosis Date Noted  . Primary osteoarthritis of left knee 06/03/2020  . HLD (hyperlipidemia) 09/17/2016  . Essential hypertension 09/17/2016  . Stroke ([place]) 09/17/2016  . Chronic GERD 09/17/2016  . OA (osteoarthritis) of knee 09/17/2016  . Knee joint replacement status, right 09/17/2016  . Colon polyp 09/17/2016    1:55 PM, 07/22/20 Mearl Latin PT, DPT Physical Therapist at [place], [place], [address] Phone: [phone]    Fax:  [phone]  Name: Jeremiah Kelley MRN: [id number] Date of Birth: 04-Dec-1945

## 2020-07-24 ENCOUNTER — Ambulatory Visit (HOSPITAL_COMMUNITY): Payer: No Typology Code available for payment source | Admitting: Physical Therapy

## 2020-07-24 ENCOUNTER — Other Ambulatory Visit: Payer: Self-pay

## 2020-07-24 DIAGNOSIS — M25562 Pain in left knee: Secondary | ICD-10-CM

## 2020-07-24 DIAGNOSIS — M25662 Stiffness of left knee, not elsewhere classified: Secondary | ICD-10-CM

## 2020-07-24 DIAGNOSIS — R2689 Other abnormalities of gait and mobility: Secondary | ICD-10-CM

## 2020-07-24 DIAGNOSIS — M6281 Muscle weakness (generalized): Secondary | ICD-10-CM

## 2020-07-24 NOTE — Therapy (Signed)
[facility] [address] [place], [place], [address] Phone: [phone]   Fax:  [phone]  Physical Therapy Treatment  Patient Details  Name: Jeremiah Kelley MRN: [id number] Date of Birth: 04/21/1946 Referring Provider (PT): Arther Abbott PA-C   Encounter Date: 07/24/2020   PT End of Session - 07/24/20 1400    Visit Number 9    Number of Visits 12    Date for PT Re-Evaluation 08/05/20    Authorization Type VA    Authorization Time Period VA approved PT 15 visits    Authorization - Visit Number 9    Authorization - Number of Visits 15    Progress Note Due on Visit 10    PT Start Time 1319    PT Stop Time 1345    PT Time Calculation (min) 26 min    Activity Tolerance Patient tolerated treatment well    Behavior During Therapy [facility] for tasks assessed/performed           Past Medical History:  Diagnosis Date  . Arthritis    osteoarthritis knees hips  . GERD (gastroesophageal reflux disease)   . Hyperlipidemia   . Hypertension   . Stroke [facility])     Past Surgical History:  Procedure Laterality Date  . JOINT REPLACEMENT     right knee  . KNEE SURGERY Right 2017  . TONSILLECTOMY     age 70  . TONSILLECTOMY    . TOTAL HIP ARTHROPLASTY Left 12/09/2016   Procedure: LEFT TOTAL HIP ARTHROPLASTY ANTERIOR APPROACH;  Surgeon: Ollen Gross, MD;  [facility];  Service: Orthopedics;  Laterality: Left;  . TOTAL KNEE ARTHROPLASTY Left 06/03/2020   Procedure: TOTAL KNEE ARTHROPLASTY;  Surgeon: Ollen Gross, MD;  [facility];  Service: Orthopedics;  Laterality: Left;   . WRIST SURGERY Left     There were no vitals filed for this visit.   Subjective Assessment - 07/24/20 1351    Subjective Pt states he has to leave early as someone is coiming to fit his wife for a new wheelchair.  STates he is doing well today without pain.    Currently in Pain? No/denies                             [facility] Adult  PT Treatment/Exercise - 07/24/20 0001      Knee/Hip Exercises: Stretches   Active Hamstring Stretch Left;3 reps;30 seconds    Active Hamstring Stretch Limitations standing onto 12" step    Gastroc Stretch 3 reps;30 seconds    Gastroc Stretch Limitations slant board      Knee/Hip Exercises: Aerobic   Recumbent Bike 5 minutes - seat 13 full rev      Knee/Hip Exercises: Machines for Strengthening   Cybex Knee Extension 1 plate 3x 10    Cybex Leg Press 3x10 5 plates                    PT Short Term Goals - 06/24/20 1459      PT SHORT TERM GOAL #1   Title Patient will be independent with HEP in order to improve functional outcomes.    Time 3    Period Weeks    Status New    Target Date 07/15/20      PT SHORT TERM GOAL #2   Title Patient will report at least 25% improvement in symptoms for improved quality of life.  Time 3    Period Weeks    Status New    Target Date 07/15/20             PT Long Term Goals - 06/24/20 1459      PT LONG TERM GOAL #1   Title Patient will report at least 75% improvement in symptoms for improved quality of life.    Time 6    Period Weeks    Status New    Target Date 08/05/20      PT LONG TERM GOAL #2   Title Patient will improve FOTO score by at least 10 points in order to indicate improved tolerance to activity.    Time 6    Period Weeks    Status New    Target Date 08/05/20      PT LONG TERM GOAL #3   Title Patient will be able to ambulate at least 400 feet during 2MWT with LRAD to demonstrate improved ability to perform functional mobility and associated tasks.    Time 6    Period Weeks    Status New    Target Date 08/05/20      PT LONG TERM GOAL #4   Title Patient will be able to navigate stairs with reciprocal pattern without compensation in order to demonstrate improved LE strength.    Time 6    Period Weeks    Status New    Target Date 08/05/20                 Plan - 07/24/20 1401    Clinical  Impression Statement Pt requirested to leave early so unable to complete full session.  Able to increase seat up to 13 today with full revolutions completed.  Worked on increasing posterior LE ROM via hamstring stretch and gastroc stretch.  Continued with strengthening using cybex machines per patient request.  Encouraged to complete steps and additional therex at home today as unable to complete in clinic.  Pt verbalized understanding.    Personal Factors and Comorbidities Age;Past/Current Experience;Behavior Pattern    Examination-Activity Limitations Sleep;Bed Mobility;Bend;Squat;Lift;Locomotion Level;Stand;Transfers;Stairs    Examination-Participation Restrictions Cleaning;Community Activity;Yard Work;Shop;Volunteer    Stability/Clinical Decision Making Stable/Uncomplicated    Rehab Potential Good    PT Frequency 2x / week    PT Duration 6 weeks    PT Treatment/Interventions ADLs/Self Care Home Management;Aquatic Therapy;Biofeedback;Canalith Repostioning;Cryotherapy;Electrical Stimulation;Traction;Moist Heat;Neuromuscular re-education;DME Instruction;Gait training;Stair training;Functional mobility training;Therapeutic activities;Therapeutic exercise;Balance training;Patient/family education;Manual techniques;Manual lymph drainage;Compression bandaging;Scar mobilization;Passive range of motion;Dry needling;Energy conservation;Splinting;Taping;Vasopneumatic Device;Spinal Manipulations;Joint Manipulations    PT Next Visit Plan Focus on ROM based exercises and progress to strengthening as ROM improves.  Manual for ROM and soft tissue restrictions.  Complete 10th visit PN    PT Home Exercise Plan 12/27 LAQ, heel slides 12/29 SLR and continue HHPT exercises           Patient will benefit from skilled therapeutic intervention in order to improve the following deficits and impairments:  Decreased endurance,Abnormal gait,Decreased activity tolerance,Decreased balance,Difficulty walking,Decreased  strength,Pain,Increased muscle spasms,Improper body mechanics,Impaired flexibility,Decreased range of motion  Visit Diagnosis: Stiffness of left knee, not elsewhere classified  Muscle weakness (generalized)  Left knee pain, unspecified chronicity  Other abnormalities of gait and mobility     Problem List Patient Active Problem List   Diagnosis Date Noted  . Primary osteoarthritis of left knee 06/03/2020  . HLD (hyperlipidemia) 09/17/2016  . Essential hypertension 09/17/2016  . Stroke ([place]) 09/17/2016  . Chronic GERD 09/17/2016  . OA (  osteoarthritis) of knee 09/17/2016  . Knee joint replacement status, right 09/17/2016  . Colon polyp 09/17/2016   Teena Irani, PTA/CLT [phone]  Teena Irani 07/24/2020, 2:08 PM  [place], [place], [address] Phone: [phone]   Fax:  [phone]  Name: Jeremiah Kelley MRN: [id number] Date of Birth: October 16, 1945

## 2020-07-29 ENCOUNTER — Encounter (HOSPITAL_COMMUNITY): Payer: No Typology Code available for payment source | Admitting: Physical Therapy

## 2020-07-29 ENCOUNTER — Telehealth (HOSPITAL_COMMUNITY): Payer: Self-pay | Admitting: Physical Therapy

## 2020-07-29 NOTE — Telephone Encounter (Signed)
07/19/2020 Pt left voice message on the [facility] requesting a booster for he and his wife. Returned call to pt who states his wife is now an inpatient at [facility] and he is requesting she receives the vaccine prior to discharge. Pt states [facility] and he will get his booster there when his wife is discharged.  Dan Humphreys, BSN-RN-CCM

## 2020-07-29 NOTE — Telephone Encounter (Signed)
pt cancelled appt for today, no reason given 

## 2020-07-31 ENCOUNTER — Telehealth (HOSPITAL_COMMUNITY): Payer: Self-pay

## 2020-07-31 ENCOUNTER — Telehealth (HOSPITAL_COMMUNITY): Payer: Self-pay | Admitting: Physical Therapy

## 2020-07-31 ENCOUNTER — Ambulatory Visit (HOSPITAL_COMMUNITY): Payer: No Typology Code available for payment source

## 2020-07-31 NOTE — Telephone Encounter (Signed)
S/w pt and he states he is doing better and his wife is very sick so he wants to request to be D/c today.

## 2020-07-31 NOTE — Telephone Encounter (Signed)
pt called to cx today's appt due to some people are coming to his home to visit his wife.

## 2020-08-01 ENCOUNTER — Encounter (HOSPITAL_COMMUNITY): Payer: Self-pay | Admitting: Physical Therapy

## 2020-08-01 NOTE — Therapy (Signed)
[place], [place], [address] Phone: [phone]   Fax:  [phone]  Patient Details  Name: Jeremiah Kelley MRN: [id number] Date of Birth: 03/24/1946 Referring Provider:  No ref. provider found  Encounter Date: 08/01/2020   PHYSICAL THERAPY DISCHARGE SUMMARY  Visits from Start of Care: 9  Current functional level related to goals / functional outcomes: Patient ROM on 07/22/20 was lacking 5 to 114. Patient was due for progress note but patient requested discharge to take care of his wife and because he feels he is doing great.    Remaining deficits: Unknown as patient has not returned.   Education / Equipment: HEP  Plan: Patient agrees to discharge.  Patient goals were not met. Patient is being discharged due to being pleased with the current functional level.  ?????       2:38 PM, 08/01/20 Mearl Latin PT, DPT Physical Therapist at [place], [place], [address] Phone: [phone]   Fax:  [phone]

## 2020-10-27 DIAGNOSIS — I10 Essential (primary) hypertension: Secondary | ICD-10-CM | POA: Diagnosis not present

## 2020-10-27 DIAGNOSIS — M1990 Unspecified osteoarthritis, unspecified site: Secondary | ICD-10-CM | POA: Diagnosis not present

## 2020-12-26 DIAGNOSIS — M1990 Unspecified osteoarthritis, unspecified site: Secondary | ICD-10-CM | POA: Diagnosis not present

## 2020-12-26 DIAGNOSIS — I10 Essential (primary) hypertension: Secondary | ICD-10-CM | POA: Diagnosis not present

## 2021-02-18 DIAGNOSIS — M1990 Unspecified osteoarthritis, unspecified site: Secondary | ICD-10-CM | POA: Diagnosis not present

## 2021-02-18 DIAGNOSIS — I10 Essential (primary) hypertension: Secondary | ICD-10-CM | POA: Diagnosis not present

## 2021-03-07 ENCOUNTER — Other Ambulatory Visit: Payer: Self-pay

## 2021-03-07 DIAGNOSIS — I83891 Varicose veins of right lower extremities with other complications: Secondary | ICD-10-CM

## 2021-03-14 ENCOUNTER — Ambulatory Visit (HOSPITAL_COMMUNITY)
Admission: RE | Admit: 2021-03-14 | Discharge: 2021-03-14 | Disposition: A | Discharge status: Home / Self Care | Payer: No Typology Code available for payment source | Source: Ambulatory Visit | Attending: Vascular Surgery | Admitting: Vascular Surgery

## 2021-03-14 ENCOUNTER — Ambulatory Visit (INDEPENDENT_AMBULATORY_CARE_PROVIDER_SITE_OTHER): Payer: No Typology Code available for payment source | Admitting: Physician Assistant

## 2021-03-14 ENCOUNTER — Other Ambulatory Visit: Payer: Self-pay

## 2021-03-14 VITALS — BP 134/74 | HR 52 | Temp 97.6°F | Resp 16 | Ht 70.5 in | Wt 239.0 lb

## 2021-03-14 DIAGNOSIS — I83891 Varicose veins of right lower extremities with other complications: Secondary | ICD-10-CM | POA: Diagnosis not present

## 2021-03-14 DIAGNOSIS — I872 Venous insufficiency (chronic) (peripheral): Secondary | ICD-10-CM

## 2021-03-14 DIAGNOSIS — I83893 Varicose veins of bilateral lower extremities with other complications: Secondary | ICD-10-CM

## 2021-03-14 NOTE — Progress Notes (Signed)
Office Note     CC:  follow up Requesting Provider:  Harl Bowie, MD  HPI: Jeremiah Kelley is a 75 y.o. ([birth year]) male who presents for evaluation of bilateral lower extremity edema.  Patient states he cares for his wife who is quadriplegic with MS.  Over the past 3 weeks he has been wearing thigh-high compression and making an effort to elevate his legs which have shown to be somewhat beneficial.  He denies any history of DVT, venous ulcerations, trauma, or prior vascular intervention.  He has had bilateral knee replacements and believes edema increased after each surgery.  He also recently had a painful episode at cluster of superficial veins of left medial knee however this has improved.  He is a former smoker.  Past Medical History:  Diagnosis Date   Arthritis    osteoarthritis knees hips   GERD (gastroesophageal reflux disease)    Hyperlipidemia    Hypertension    Stroke [facility])     Past Surgical History:  Procedure Laterality Date   JOINT REPLACEMENT     right knee   KNEE SURGERY Right 2017   TONSILLECTOMY     age 41   TONSILLECTOMY     TOTAL HIP ARTHROPLASTY Left 12/09/2016   Procedure: LEFT TOTAL HIP ARTHROPLASTY ANTERIOR APPROACH;  Surgeon: Gaynelle Arabian, MD;  [facility];  Service: Orthopedics;  Laterality: Left;   TOTAL KNEE ARTHROPLASTY Left 06/03/2020   Procedure: TOTAL KNEE ARTHROPLASTY;  Surgeon: Gaynelle Arabian, MD;  Location[facility];  Service: Orthopedics;  Laterality: Left;  52mn   WRIST SURGERY Left     Social History   Socioeconomic History   Marital status: Married    Spouse name: PNevin Bloodgood  Number of children: 2   Years of education: 16   Highest education level: Not on file  Occupational History   Occupation: retired    Comment: [employer]    Comment: sherriff  Tobacco Use   Smoking status: Former    Packs/day: 0.30    Types: Cigarettes    Start date: 06/29/2008   Smokeless tobacco: Never   Tobacco comments:    stopped 3 months ago   Vaping Use   Vaping Use: Never used  Substance and Sexual Activity   Alcohol use: Yes    Comment: social drink occas   Drug use: No   Sexual activity: Not Currently  Other Topics Concern   Not on file  Social History Narrative   Lives with wife paula   She is in a wheelchair   Both children are adults and [place] in [place]  [place] in [place] [employer] [place] [employer] assistant[place]rain: Not on file  Food Insecurity: Not on file  Transportation Needs: Not on file  Physical Activity: Not on file  Stress: Not on file  Social Connections: Not on file  Intimate Partner Violence: Not on file    Family History  Problem Relation Age of Onset   Arthritis Mother    Cancer Mother        breast   Hypertension Mother    Alzheimer's disease Mother    Cirrhosis Maternal Grandmother     Current Outpatient Medications  Medication Sig Dispense Refill   amLODipine (NORVASC) 5 MG tablet Take 5 mg by mouth daily.      carboxymethylcellul-glycerin (REFRESH OPTIVE) 0.5-0.9 % ophthalmic solution Place 1 drop into both eyes in the morning and at bedtime.     cholecalciferol (  VITAMIN D3) 25 MCG (1000 UNIT) tablet Take 1,000 Units by mouth daily.     hydrochlorothiazide (HYDRODIURIL) 25 MG tablet Take 25 mg by mouth daily.     oxyCODONE (OXY IR/ROXICODONE) 5 MG immediate release tablet Take 1-2 tablets (5-10 mg total) by mouth every 6 (six) hours as needed for severe pain. 42 tablet 0   pantoprazole (PROTONIX) 40 MG tablet Take 40 mg by mouth daily.     simvastatin (ZOCOR) 20 MG tablet Take 20 mg by mouth daily.     vitamin B-12 (CYANOCOBALAMIN) 500 MCG tablet Take 500 mcg by mouth daily.      vitamin E 180 MG (400 UNITS) capsule Take 400 Units by mouth daily.     gabapentin (NEURONTIN) 300 MG capsule Take a 300 mg capsule three times a day for two weeks following surgery.Then take a 300 mg capsule two times a day for two weeks. Then take a 300 mg capsule once a day for two weeks.  Then discontinue. (Patient not taking: Reported on 03/14/2021) 84 capsule 0   methocarbamol (ROBAXIN) 500 MG tablet Take 1 tablet (500 mg total) by mouth every 6 (six) hours as needed for muscle spasms. (Patient not taking: Reported on 03/14/2021) 40 tablet 0   traMADol (ULTRAM) 50 MG tablet Take 1-2 tablets (50-100 mg total) by mouth every 6 (six) hours as needed for moderate pain. (Patient not taking: Reported on 03/14/2021) 40 tablet 0   No current facility-administered medications for this visit.    No Known Allergies   REVIEW OF SYSTEMS:   '[X]'$  denotes positive finding, '[ ]'$  denotes negative finding Cardiac  Comments:  Chest pain or chest pressure:    Shortness of breath upon exertion:    Short of breath when lying flat:    Irregular heart rhythm:        Vascular    Pain in calf, thigh, or hip brought on by ambulation:    Pain in feet at night that wakes you up from your sleep:     Blood clot in your veins:    Leg swelling:         Pulmonary    Oxygen at home:    Productive cough:     Wheezing:         Neurologic    Sudden weakness in arms or legs:     Sudden numbness in arms or legs:     Sudden onset of difficulty speaking or slurred speech:    Temporary loss of vision in one eye:     Problems with dizziness:         Gastrointestinal    Blood in stool:     Vomited blood:         Genitourinary    Burning when urinating:     Blood in urine:        Psychiatric    Major depression:         Hematologic    Bleeding problems:    Problems with blood clotting too easily:        Skin    Rashes or ulcers:        Constitutional    Fever or chills:      PHYSICAL EXAMINATION:  Vitals:   03/14/21 1022  BP: 134/74  Pulse: (!) 52  Resp: 16  Temp: 97.6 F (36.4 C)  TempSrc: Temporal  SpO2: 99%  Weight: 239 lb (108.4 kg)  Height: 5' 10.5" (1.791 m)    General:  WDWN in NAD; vital signs documented above Gait: Not observed HENT: WNL, normocephalic Pulmonary:  normal non-labored breathing , without Rales, rhonchi,  wheezing Cardiac: regular HR Abdomen: soft, NT, no masses Skin: without rashes Vascular Exam/Pulses:  Right Left  Radial 2+ (normal) 2+ (normal)  DP 2+ (normal) 1+ (weak)   Extremities: Stasis pigmentation changes of right shin; right lower leg edema more so than left; spider veins of left medial distal thigh and knee Musculoskeletal: no muscle wasting or atrophy  Neurologic: A&O X 3;  No focal weakness or paresthesias are detected Psychiatric:  The pt has Normal affect.   Non-Invasive Vascular Imaging:    Right lower extremity venous reflux study negative for DVT Deep reflux noted in the common femoral vein GSV reflux at the saphenofemoral junction as well as the proximal calf however segment in between is competent   ASSESSMENT/PLAN:: 75 y.o. male here for evaluation of bilateral lower leg edema with symptomatic spider veins of left leg  -Right leg venous reflux study negative for DVT.  Mild deep and superficial insufficiency noted.  He would not be a candidate for laser ablation therapy. -Continue use of thigh-high compression daily.  We discussed proper elevation of legs above the heart periodically throughout the day.  He should also try to avoid prolonged sitting and standing as possible.  He may use NSAIDs for discomfort associated with venous symptoms. -It sounds like patient experienced an episode of superficial thrombophlebitis in the left leg which is resolving.  He would be a good candidate for sclerotherapy to the cluster of superficial veins in his medial distal thigh and medial left knee.  Patient requested that we inquire if expenses will be covered by [facility]. -Patient can otherwise follow-up on an as-needed basis   Dagoberto Ligas, PA-C [facility] [phone]  Clinic MD:   Virl Cagey

## 2021-03-28 DIAGNOSIS — I10 Essential (primary) hypertension: Secondary | ICD-10-CM | POA: Diagnosis not present

## 2021-03-28 DIAGNOSIS — M1990 Unspecified osteoarthritis, unspecified site: Secondary | ICD-10-CM | POA: Diagnosis not present

## 2021-03-28 DIAGNOSIS — E785 Hyperlipidemia, unspecified: Secondary | ICD-10-CM | POA: Diagnosis not present

## 2021-04-16 DIAGNOSIS — Z0001 Encounter for general adult medical examination with abnormal findings: Secondary | ICD-10-CM | POA: Diagnosis not present

## 2021-04-17 ENCOUNTER — Telehealth: Payer: Self-pay

## 2021-04-17 NOTE — Telephone Encounter (Signed)
Pt called to schedule sclerotherapy for a "dime sized area" on his leg that is bothersome. He states he has spoken to his [employer] and they approved it. I explained to him the cost and that he is ultimately responsible for this if insurance does not cover it. Pt verbalized understanding and wishes to proceed. He has been scheduled for next month.

## 2021-04-28 DIAGNOSIS — M1990 Unspecified osteoarthritis, unspecified site: Secondary | ICD-10-CM | POA: Diagnosis not present

## 2021-04-28 DIAGNOSIS — E782 Mixed hyperlipidemia: Secondary | ICD-10-CM | POA: Diagnosis not present

## 2021-04-28 DIAGNOSIS — I10 Essential (primary) hypertension: Secondary | ICD-10-CM | POA: Diagnosis not present

## 2021-05-12 ENCOUNTER — Other Ambulatory Visit: Payer: Self-pay

## 2021-05-12 ENCOUNTER — Ambulatory Visit (INDEPENDENT_AMBULATORY_CARE_PROVIDER_SITE_OTHER): Payer: Medicare HMO

## 2021-05-12 DIAGNOSIS — I83893 Varicose veins of bilateral lower extremities with other complications: Secondary | ICD-10-CM | POA: Diagnosis not present

## 2021-05-12 DIAGNOSIS — I83892 Varicose veins of left lower extremities with other complications: Secondary | ICD-10-CM | POA: Diagnosis not present

## 2021-05-12 NOTE — Progress Notes (Signed)
Treated pt's small reticular vein on LLE with Asclera 1% administered with a 27g butterfly.  Patient received a total of 1 mL. Pt tolerated very well. Easy access. Pt first noticed this area a few months after the knee surgery. Area has been tender since it developed after that surgery. Pt was given post treatment care instructions both verbally and on handout. Will follow PRN.  Photos: Yes.    Compression stockings applied: Yes.

## 2021-05-28 DIAGNOSIS — I10 Essential (primary) hypertension: Secondary | ICD-10-CM | POA: Diagnosis not present

## 2021-05-28 DIAGNOSIS — E782 Mixed hyperlipidemia: Secondary | ICD-10-CM | POA: Diagnosis not present

## 2021-06-27 DIAGNOSIS — E782 Mixed hyperlipidemia: Secondary | ICD-10-CM | POA: Diagnosis not present

## 2021-06-27 DIAGNOSIS — I10 Essential (primary) hypertension: Secondary | ICD-10-CM | POA: Diagnosis not present

## 2021-07-02 ENCOUNTER — Telehealth: Payer: Self-pay

## 2021-07-03 ENCOUNTER — Ambulatory Visit (INDEPENDENT_AMBULATORY_CARE_PROVIDER_SITE_OTHER): Payer: No Typology Code available for payment source

## 2021-07-03 ENCOUNTER — Other Ambulatory Visit: Payer: Self-pay

## 2021-07-03 DIAGNOSIS — I8392 Asymptomatic varicose veins of left lower extremity: Secondary | ICD-10-CM

## 2021-07-03 NOTE — Progress Notes (Signed)
Treated pt's LLE spider and small reticular veins (2 areas only) with Asclera 1% administered with a 27g butterfly.  Patient received a total of 2 mL. Pt had treatment in November 2022 which he feels he saw improvement from. He had another area that he believes developed after his L knee replacement, that he wanted treated today. Area does not look like veins but more like a flat scar. Skin is hyper pigmented in this area. I explained this to him and had PA assess area, who was in agreement. Pt verbalized understanding. Post procedure care instructions provided on both handout and verbally. Will follow PRN.    Photos: Yes.    Compression stockings applied: Yes.

## 2021-07-04 ENCOUNTER — Ambulatory Visit: Payer: No Typology Code available for payment source

## 2021-08-26 DIAGNOSIS — E119 Type 2 diabetes mellitus without complications: Secondary | ICD-10-CM | POA: Diagnosis not present

## 2021-08-26 DIAGNOSIS — I10 Essential (primary) hypertension: Secondary | ICD-10-CM | POA: Diagnosis not present

## 2021-09-03 ENCOUNTER — Telehealth: Payer: Self-pay

## 2021-09-03 NOTE — Telephone Encounter (Signed)
Returned pt's call about his vein that has had 2 sclerotherapy treatments. His [employer] authorization for sclerotherapy is up next week and he just wanted to touch base. He feels the area treated has improved but is still there. I explained to him this was probably the best it can be treated from sclerotherapy and the other area of concern is not a vein. Pt verbalized understanding. No further questions/concerns at this time.  ?

## 2021-09-26 DIAGNOSIS — I10 Essential (primary) hypertension: Secondary | ICD-10-CM | POA: Diagnosis not present

## 2021-09-26 DIAGNOSIS — E1159 Type 2 diabetes mellitus with other circulatory complications: Secondary | ICD-10-CM | POA: Diagnosis not present

## 2021-10-26 DIAGNOSIS — I10 Essential (primary) hypertension: Secondary | ICD-10-CM | POA: Diagnosis not present

## 2021-11-26 DIAGNOSIS — I10 Essential (primary) hypertension: Secondary | ICD-10-CM | POA: Diagnosis not present

## 2021-12-04 NOTE — Telephone Encounter (Signed)
No additional nursing notes.

## 2021-12-09 DIAGNOSIS — M1990 Unspecified osteoarthritis, unspecified site: Secondary | ICD-10-CM | POA: Diagnosis not present

## 2021-12-09 DIAGNOSIS — I10 Essential (primary) hypertension: Secondary | ICD-10-CM | POA: Diagnosis not present

## 2021-12-09 DIAGNOSIS — Z7982 Long term (current) use of aspirin: Secondary | ICD-10-CM | POA: Diagnosis not present

## 2021-12-09 DIAGNOSIS — N529 Male erectile dysfunction, unspecified: Secondary | ICD-10-CM | POA: Diagnosis not present

## 2021-12-09 DIAGNOSIS — E785 Hyperlipidemia, unspecified: Secondary | ICD-10-CM | POA: Diagnosis not present

## 2021-12-09 DIAGNOSIS — K219 Gastro-esophageal reflux disease without esophagitis: Secondary | ICD-10-CM | POA: Diagnosis not present

## 2021-12-09 DIAGNOSIS — Z791 Long term (current) use of non-steroidal anti-inflammatories (NSAID): Secondary | ICD-10-CM | POA: Diagnosis not present

## 2021-12-09 DIAGNOSIS — E669 Obesity, unspecified: Secondary | ICD-10-CM | POA: Diagnosis not present

## 2021-12-09 DIAGNOSIS — Z6834 Body mass index (BMI) 34.0-34.9, adult: Secondary | ICD-10-CM | POA: Diagnosis not present

## 2021-12-26 DIAGNOSIS — I10 Essential (primary) hypertension: Secondary | ICD-10-CM | POA: Diagnosis not present

## 2022-01-26 DIAGNOSIS — I10 Essential (primary) hypertension: Secondary | ICD-10-CM | POA: Diagnosis not present

## 2022-05-20 DIAGNOSIS — Z139 Encounter for screening, unspecified: Secondary | ICD-10-CM | POA: Diagnosis not present

## 2022-05-20 DIAGNOSIS — Z1211 Encounter for screening for malignant neoplasm of colon: Secondary | ICD-10-CM | POA: Diagnosis not present

## 2022-05-20 DIAGNOSIS — R7301 Impaired fasting glucose: Secondary | ICD-10-CM | POA: Diagnosis not present

## 2022-05-27 DIAGNOSIS — Z125 Encounter for screening for malignant neoplasm of prostate: Secondary | ICD-10-CM | POA: Diagnosis not present

## 2022-05-27 DIAGNOSIS — K219 Gastro-esophageal reflux disease without esophagitis: Secondary | ICD-10-CM | POA: Diagnosis not present

## 2022-05-27 DIAGNOSIS — R7303 Prediabetes: Secondary | ICD-10-CM | POA: Diagnosis not present

## 2022-05-27 DIAGNOSIS — E782 Mixed hyperlipidemia: Secondary | ICD-10-CM | POA: Diagnosis not present

## 2022-05-27 DIAGNOSIS — M1990 Unspecified osteoarthritis, unspecified site: Secondary | ICD-10-CM | POA: Diagnosis not present

## 2022-05-27 DIAGNOSIS — Z0001 Encounter for general adult medical examination with abnormal findings: Secondary | ICD-10-CM | POA: Diagnosis not present

## 2022-05-27 DIAGNOSIS — Z1211 Encounter for screening for malignant neoplasm of colon: Secondary | ICD-10-CM | POA: Diagnosis not present

## 2022-05-27 DIAGNOSIS — I10 Essential (primary) hypertension: Secondary | ICD-10-CM | POA: Diagnosis not present

## 2022-05-29 LAB — COLOGUARD: COLOGUARD: NEGATIVE

## 2022-05-29 LAB — EXTERNAL GENERIC LAB PROCEDURE: COLOGUARD: NEGATIVE

## 2022-08-06 NOTE — Patient Instructions (Addendum)
SURGICAL WAITING ROOM VISITATION Patients having surgery or a procedure may have no more than 2 support people in the waiting area - these visitors may rotate in the visitor waiting room.   Due to an increase in RSV and influenza rates and associated hospitalizations, children ages 53 and under may not visit patients in [place]. If the patient needs to stay at the hospital during part of their recovery, the visitor guidelines for inpatient rooms apply.  PRE-OP VISITATION  Pre-op nurse will coordinate an appropriate time for 1 support person to accompany the patient in pre-op.  This support person may not rotate.  This visitor will be contacted when the time is appropriate for the visitor to come back in the pre-op area.  Please refer to the [facility] website for the visitor guidelines for Inpatients (after your surgery is over and you are in a regular room).  You are not required to quarantine at this time prior to your surgery. However, you must do this: Hand Hygiene often Do NOT share personal items Notify your provider if you are in close contact with someone who has COVID or you develop fever 100.4 or greater, new onset of sneezing, cough, sore throat, shortness of breath or body aches.  If you test positive for Covid or have been in contact with anyone that has tested positive in the last 10 days please notify you surgeon.    Your procedure is scheduled on:  Wednesday  August 19, 2022  Report to [facility] Main Entrance: [address] entrance where the [employer] is available.   Report to admitting at:  07:15 AM  +++++Call this number if you have any questions or problems the morning of surgery [phone]  Do not eat food after Midnight the night prior to your surgery/procedure.  After Midnight you may have the following liquids until 06:45  AM DAY OF SURGERY  Clear Liquid Diet Water Black Coffee (sugar ok, NO MILK/CREAM OR CREAMERS)  Tea (sugar ok,  NO MILK/CREAM OR CREAMERS) regular and decaf                             Plain Jell-O  with no fruit (NO RED)                                           Fruit ices (not with fruit pulp, NO RED)                                     Popsicles (NO RED)                                                                  Juice: apple, WHITE grape, WHITE cranberry Sports drinks like Gatorade or Powerade (NO RED)                   The day of surgery:  Drink ONE (1) Pre-Surgery Clear Ensure  at  06:45 AM the morning of surgery. Drink in one sitting. Do not  sip.  This drink was given to you during your hospital pre-op appointment visit. Nothing else to drink after completing the Pre-Surgery Clear Ensure  : No candy, chewing gum or throat lozenges.    FOLLOW ANY ADDITIONAL PRE OP INSTRUCTIONS YOU RECEIVED FROM YOUR SURGEON'S OFFICE!!!   Oral Hygiene is also important to reduce your risk of infection.        Remember - BRUSH YOUR TEETH THE MORNING OF SURGERY WITH YOUR REGULAR TOOTHPASTE  Do NOT smoke after Midnight the night before surgery.  Take ONLY these medicines the morning of surgery with A SIP OF WATER: Pantoprazole (Protonix), Amlodipine                     You may not have any metal on your body including jewelry, and body piercing  Do not wear  lotions, powders, cologne, or deodorant  Men may shave face and neck.  Contacts, Hearing Aids, dentures or bridgework may not be worn into surgery. DENTURES WILL BE REMOVED PRIOR TO SURGERY PLEASE DO NOT APPLY "Poly grip" OR ADHESIVES!!!  You may bring a small overnight bag with you on the day of surgery, only pack items that are not valuable. [facility] IS NOT RESPONSIBLE   FOR VALUABLES THAT ARE LOST OR STOLEN.   Do not bring your home medications to the hospital. The Pharmacy will dispense medications listed on your medication list to you during your admission in the Hospital.  Special Instructions: Bring a copy of your healthcare power of  attorney and living will documents the day of surgery, if you wish to have them scanned into your [facility] Records- EPIC  Please read over the following fact sheets you were given: IF YOU HAVE QUESTIONS ABOUT YOUR PRE-OP INSTRUCTIONS, PLEASE CALL [phone]  ([place])   [facility] - Preparing for Surgery Before surgery, you can play an important role.  Because skin is not sterile, your skin needs to be as free of germs as possible.  You can reduce the number of germs on your skin by washing with CHG (chlorahexidine gluconate) soap before surgery.  CHG is an antiseptic cleaner which kills germs and bonds with the skin to continue killing germs even after washing. Please DO NOT use if you have an allergy to CHG or antibacterial soaps.  If your skin becomes reddened/irritated stop using the CHG and inform your nurse when you arrive at Short Stay. Do not shave (including legs and underarms) for at least 48 hours prior to the first CHG shower.  You may shave your face/neck.  Please follow these instructions carefully:  1.  Shower with CHG Soap the night before surgery and the  morning of surgery.  2.  If you choose to wash your hair, wash your hair first as usual with your normal  shampoo.  3.  After you shampoo, rinse your hair and body thoroughly to remove the shampoo.                             4.  Use CHG as you would any other liquid soap.  You can apply chg directly to the skin and wash.  Gently with a scrungie or clean washcloth.  5.  Apply the CHG Soap to your body ONLY FROM THE NECK DOWN.   Do not use on face/ open  Wound or open sores. Avoid contact with eyes, ears mouth and genitals (private parts).                       Wash face,  Genitals (private parts) with your normal soap.             6.  Wash thoroughly, paying special attention to the area where your  surgery  will be performed.  7.  Thoroughly rinse your body with warm water from the neck  down.  8.  DO NOT shower/wash with your normal soap after using and rinsing off the CHG Soap.            9.  Pat yourself dry with a clean towel.            10.  Wear clean pajamas.            11.  Place clean sheets on your bed the night of your first shower and do not  sleep with pets.  ON THE DAY OF SURGERY : Do not apply any lotions/deodorants the morning of surgery.  Please wear clean clothes to the hospital/surgery center.    FAILURE TO FOLLOW THESE INSTRUCTIONS MAY RESULT IN THE CANCELLATION OF YOUR SURGERY  PATIENT SIGNATURE_________________________________  NURSE SIGNATURE__________________________________  ________________________________________________________________________      Adam Phenix    An incentive spirometer is a tool that can help keep your lungs clear and active. This tool measures how well you are filling your lungs with each breath. Taking long deep breaths may help reverse or decrease the chance of developing breathing (pulmonary) problems (especially infection) following: A long period of time when you are unable to move or be active. BEFORE THE PROCEDURE  If the spirometer includes an indicator to show your best effort, your nurse or respiratory therapist will set it to a desired goal. If possible, sit up straight or lean slightly forward. Try not to slouch. Hold the incentive spirometer in an upright position. INSTRUCTIONS FOR USE  Sit on the edge of your bed if possible, or sit up as far as you can in bed or on a chair. Hold the incentive spirometer in an upright position. Breathe out normally. Place the mouthpiece in your mouth and seal your lips tightly around it. Breathe in slowly and as deeply as possible, raising the piston or the ball toward the top of the column. Hold your breath for 3-5 seconds or for as long as possible. Allow the piston or ball to fall to the bottom of the column. Remove the mouthpiece from your mouth and  breathe out normally. Rest for a few seconds and repeat Steps 1 through 7 at least 10 times every 1-2 hours when you are awake. Take your time and take a few normal breaths between deep breaths. The spirometer may include an indicator to show your best effort. Use the indicator as a goal to work toward during each repetition. After each set of 10 deep breaths, practice coughing to be sure your lungs are clear. If you have an incision (the cut made at the time of surgery), support your incision when coughing by placing a pillow or rolled up towels firmly against it. Once you are able to get out of bed, walk around indoors and cough well. You may stop using the incentive spirometer when instructed by your caregiver.  RISKS AND COMPLICATIONS Take your time so you do not get dizzy or light-headed. If you are in  pain, you may need to take or ask for pain medication before doing incentive spirometry. It is harder to take a deep breath if you are having pain. AFTER USE Rest and breathe slowly and easily. It can be helpful to keep track of a log of your progress. Your caregiver can provide you with a simple table to help with this. If you are using the spirometer at home, follow these instructions: [place] IF:  You are having difficultly using the spirometer. You have trouble using the spirometer as often as instructed. Your pain medication is not giving enough relief while using the spirometer. You develop fever of 100.5 F (38.1 C) or higher.                                                                                                    SEEK IMMEDIATE MEDICAL CARE IF:  You cough up bloody sputum that had not been present before. You develop fever of 102 F (38.9 C) or greater. You develop worsening pain at or near the incision site. MAKE SURE YOU:  Understand these instructions. Will watch your condition. Will get help right away if you are not doing well or get worse. Document  Released: 10/26/2006 Document Revised: 09/07/2011 Document Reviewed: 12/27/2006 [facility] Patient Information 2014 [place], [place].       WHAT IS A BLOOD TRANSFUSION? Blood Transfusion Information  A transfusion is the replacement of blood or some of its parts. Blood is made up of multiple cells which provide different functions. Red blood cells carry oxygen and are used for blood loss replacement. White blood cells fight against infection. Platelets control bleeding. Plasma helps clot blood. Other blood products are available for specialized needs, such as hemophilia or other clotting disorders. BEFORE THE TRANSFUSION  Who gives blood for transfusions?  Healthy volunteers who are fully evaluated to make sure their blood is safe. This is blood bank blood. Transfusion therapy is the safest it has ever been in the practice of medicine. Before blood is taken from a donor, a complete history is taken to make sure that person has no history of diseases nor engages in risky social behavior (examples are intravenous drug use or sexual activity with multiple partners). The donor's travel history is screened to minimize risk of transmitting infections, such as malaria. The donated blood is tested for signs of infectious diseases, such as HIV and hepatitis. The blood is then tested to be sure it is compatible with you in order to minimize the chance of a transfusion reaction. If you or a relative donates blood, this is often done in anticipation of surgery and is not appropriate for emergency situations. It takes many days to process the donated blood. RISKS AND COMPLICATIONS Although transfusion therapy is very safe and saves many lives, the main dangers of transfusion include:  Getting an infectious disease. Developing a transfusion reaction. This is an allergic reaction to something in the blood you were given. Every precaution is taken to prevent this. The decision to have a blood transfusion has  been considered carefully by your caregiver  before blood is given. Blood is not given unless the benefits outweigh the risks. AFTER THE TRANSFUSION Right after receiving a blood transfusion, you will usually feel much better and more energetic. This is especially true if your red blood cells have gotten low (anemic). The transfusion raises the level of the red blood cells which carry oxygen, and this usually causes an energy increase. The nurse administering the transfusion will monitor you carefully for complications. HOME CARE INSTRUCTIONS  No special instructions are needed after a transfusion. You may find your energy is better. Speak with your caregiver about any limitations on activity for underlying diseases you may have. SEEK MEDICAL CARE IF:  Your condition is not improving after your transfusion. You develop redness or irritation at the intravenous (IV) site. SEEK IMMEDIATE MEDICAL CARE IF:  Any of the following symptoms occur over the next 12 hours: Shaking chills. You have a temperature by mouth above 102 F (38.9 C), not controlled by medicine. Chest, back, or muscle pain. People around you feel you are not acting correctly or are confused. Shortness of breath or difficulty breathing. Dizziness and fainting. You get a rash or develop hives. You have a decrease in urine output. Your urine turns a dark color or changes to pink, red, or brown. Any of the following symptoms occur over the next 10 days: You have a temperature by mouth above 102 F (38.9 C), not controlled by medicine. Shortness of breath. Weakness after normal activity. The white part of the eye turns yellow (jaundice). You have a decrease in the amount of urine or are urinating less often. Your urine turns a dark color or changes to pink, red, or brown. Document Released: 06/12/2000 Document Revised: 09/07/2011 Document Reviewed: 01/30/2008 [facility] Patient Information 2014 [place],  [place].  _______________________________________________________________________

## 2022-08-06 NOTE — Progress Notes (Addendum)
COVID Vaccine received:  []$  No [x]$  Yes Date of any COVID positive Test in last 90 days:  None   PCP - Allyn Kenner, MD Cardiologist - none  Chest x-ray -  EKG -  05-28-2020   will repeat at PST Stress Test -  ECHO -  Cardiac Cath -   PCR screen: [x]$  Ordered & Completed                      []$   No Order but Needs PROFEND                      []$   N/A for this surgery  Surgery Plan:  []$  Ambulatory                            [x]$  Outpatient in bed                            []$  Admit  Anesthesia:    []$  General  []$  Spinal                           [x]$   Choice []$   MAC  Pacemaker / ICD device [x]$  No []$  Yes        Device order form faxed [x]$  No    []$   Yes      Faxed to:  Spinal Cord Stimulator:[x]$  No []$  Yes      (Remind patient to bring remote DOS) Other Implants:   History of Sleep Apnea? [x]$  No []$  Yes   CPAP used?- [x]$  No []$  Yes    Does the patient monitor blood sugar? []$  No []$  Yes  [x]$  N/A  Blood Thinner / Instructions:None Aspirin Instructions:  ASA 65m   Hold 7 days  ERAS Protocol Ordered: []$  No  [x]$  Yes PRE-SURGERY [x]$  ENSURE  []$  G2  Patient is to be NPO after: 06:45 am  Comments: Patient cares for his quadraplegic wife that has MS.   Activity level: Patient can not climb a flight of stairs without difficulty; [x]$  No CP  [x]$  No SOB. Patient can perform ADLs without assistance.   Anesthesia review: LBBB, HTN, Hx CVA, GERD.  Patient denies shortness of breath, fever, cough and chest pain at PAT appointment.  Patient verbalized understanding and agreement to the Pre-Surgical Instructions that were given to them at this PAT appointment. Patient was also educated of the need to review these PAT instructions again prior to his/her surgery.I reviewed the appropriate phone numbers to call if they have any and questions or concerns.

## 2022-08-07 ENCOUNTER — Encounter (HOSPITAL_COMMUNITY)
Admission: RE | Admit: 2022-08-07 | Discharge: 2022-08-07 | Disposition: A | Discharge status: Home / Self Care | Payer: No Typology Code available for payment source | Source: Ambulatory Visit | Attending: Orthopedic Surgery

## 2022-08-07 ENCOUNTER — Encounter (HOSPITAL_COMMUNITY): Payer: Self-pay

## 2022-08-07 ENCOUNTER — Other Ambulatory Visit: Payer: Self-pay

## 2022-08-07 VITALS — BP 136/86 | HR 60 | Temp 98.0°F | Resp 16 | Ht 70.0 in | Wt 242.0 lb

## 2022-08-07 DIAGNOSIS — I10 Essential (primary) hypertension: Secondary | ICD-10-CM | POA: Insufficient documentation

## 2022-08-07 DIAGNOSIS — Z01818 Encounter for other preprocedural examination: Secondary | ICD-10-CM | POA: Diagnosis present

## 2022-08-07 LAB — CBC
HCT: 43.7 % (ref 39.0–52.0)
Hemoglobin: 15 g/dL (ref 13.0–17.0)
MCH: 30.5 pg (ref 26.0–34.0)
MCHC: 34.3 g/dL (ref 30.0–36.0)
MCV: 88.8 fL (ref 80.0–100.0)
Platelets: 158 10*3/uL (ref 150–400)
RBC: 4.92 MIL/uL (ref 4.22–5.81)
RDW: 14.4 % (ref 11.5–15.5)
WBC: 5.6 10*3/uL (ref 4.0–10.5)
nRBC: 0 % (ref 0.0–0.2)

## 2022-08-07 LAB — TYPE AND SCREEN
ABO/RH(D): O POS
Antibody Screen: NEGATIVE

## 2022-08-07 LAB — BASIC METABOLIC PANEL
Anion gap: 8 (ref 5–15)
BUN: 13 mg/dL (ref 8–23)
CO2: 26 mmol/L (ref 22–32)
Calcium: 8.7 mg/dL — ABNORMAL LOW (ref 8.9–10.3)
Chloride: 104 mmol/L (ref 98–111)
Creatinine, Ser: 1.09 mg/dL (ref 0.61–1.24)
GFR, Estimated: >60 mL/min (ref >60)
Glucose, Bld: 102 mg/dL — ABNORMAL HIGH (ref 70–99)
Potassium: 3.1 mmol/L — ABNORMAL LOW (ref 3.5–5.1)
Sodium: 138 mmol/L (ref 135–145)

## 2022-08-07 LAB — SURGICAL PCR SCREEN
MRSA, PCR: NEGATIVE
Staphylococcus aureus: NEGATIVE

## 2022-08-13 NOTE — H&P (Signed)
TOTAL HIP ADMISSION H&P  Patient is admitted for right total hip arthroplasty.  Subjective:  Chief Complaint: Right hip pain  HPI: Jeremiah Kelley, 77 y.o. male, has a history of pain and functional disability in the right hip due to arthritis and patient has failed non-surgical conservative treatments for greater than 12 weeks to include NSAID's and/or analgesics, flexibility and strengthening excercises, and activity modification. Onset of symptoms was gradual, starting  several  years ago with gradually worsening course since that time. The patient noted no past surgery on the right hip. Patient currently rates pain in the right hip at 8 out of 10 with activity. Patient has night pain, worsening of pain with activity and weight bearing, and trendelenberg gait. 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 This condition presents safety issues increasing the risk of falls. There is no current active infection.  Patient Active Problem List   Diagnosis Date Noted   Primary osteoarthritis of left knee 06/03/2020   HLD (hyperlipidemia) 09/17/2016   Essential hypertension 09/17/2016   Stroke ([place]) 09/17/2016   Chronic GERD 09/17/2016   OA (osteoarthritis) of knee 09/17/2016   Knee joint replacement status, right 09/17/2016   Colon polyp 09/17/2016    Past Medical History:  Diagnosis Date   Arthritis    osteoarthritis knees hips   GERD (gastroesophageal reflux disease)    Hyperlipidemia    Hypertension    Stroke [facility])     Past Surgical History:  Procedure Laterality Date   JOINT REPLACEMENT Right    Knee arthroplasty   KNEE SURGERY Right 2017   TONSILLECTOMY     age 42   TOTAL HIP ARTHROPLASTY Left 12/09/2016   Procedure: LEFT TOTAL HIP ARTHROPLASTY ANTERIOR APPROACH;  Surgeon: Gaynelle Arabian, MD;  Location: [facility];  Service: Orthopedics;  Laterality: Left;   TOTAL KNEE ARTHROPLASTY Left 06/03/2020    Procedure: TOTAL KNEE ARTHROPLASTY;  Surgeon: Gaynelle Arabian, MD;  Location: [facility];  Service: Orthopedics;  Laterality: Left;  78mn   WRIST SURGERY Left     Prior to Admission medications   Medication Sig Start Date End Date Taking? Authorizing Provider  acetaminophen (TYLENOL) 650 MG CR tablet Take 1,300 mg by mouth every 8 (eight) hours as needed for pain.   Yes [provider]  amLODipine (NORVASC) 5 MG tablet Take 5 mg by mouth daily.    Yes [provider]  aspirin 81 MG chewable tablet Chew 81 mg by mouth daily.   Yes [provider]  bisacodyl (DULCOLAX) 5 MG EC tablet Take 5 mg by mouth daily as needed for moderate constipation.   Yes [provider]  carboxymethylcellul-glycerin (REFRESH OPTIVE) 0.5-0.9 % ophthalmic solution Place 1 drop into both eyes in the morning and at bedtime.   Yes [provider]  celecoxib (CELEBREX) 100 MG capsule Take 100 mg by mouth daily.   Yes [provider]  hydrochlorothiazide (HYDRODIURIL) 25 MG tablet Take 25 mg by mouth daily.   Yes [provider]  Multiple Vitamin (MULTIVITAMIN WITH MINERALS) TABS tablet Take 1 tablet by mouth daily.   Yes [provider]  pantoprazole (PROTONIX) 40 MG tablet Take 40 mg by mouth daily.   Yes [provider]  polyethylene glycol (MIRALAX / GLYCOLAX) 17 g packet Take 17 g by mouth daily as needed for moderate constipation.   Yes [provider]  simvastatin (ZOCOR) 20 MG tablet Take 20 mg  by mouth daily.   Yes [provider]  gabapentin (NEURONTIN) 300 MG capsule Take a 300 mg capsule three times a day for two weeks following surgery.Then take a 300 mg capsule two times a day for two weeks. Then take a 300 mg capsule once a day for two weeks. Then discontinue. Patient not taking: Reported on 03/14/2021 06/04/20   Edmisten, Ok Anis, PA  methocarbamol (ROBAXIN) 500 MG tablet Take 1 tablet (500 mg total) by mouth every 6  (six) hours as needed for muscle spasms. Patient not taking: Reported on 03/14/2021 06/04/20   Edmisten, Drue Dun L, PA  oxyCODONE (OXY IR/ROXICODONE) 5 MG immediate release tablet Take 1-2 tablets (5-10 mg total) by mouth every 6 (six) hours as needed for severe pain. Patient not taking: Reported on 08/06/2022 06/04/20   Edmisten, Ok Anis, PA  traMADol (ULTRAM) 50 MG tablet Take 1-2 tablets (50-100 mg total) by mouth every 6 (six) hours as needed for moderate pain. Patient not taking: Reported on 03/14/2021 06/04/20   Edmisten, Ok Anis, PA    No Known Allergies  Social History   Socioeconomic History   Marital status: Married    Spouse name: Nevin Bloodgood   Number of children: 2   Years of education: 16   Highest education level: Not on file  Occupational History   Occupation: retired    Comment: [employer]    Comment: [employer]  Tobacco Use   Smoking status: Former    Packs/day: 0.30    Types: Cigarettes    Start date: 06/29/2008   Smokeless tobacco: Never   Tobacco comments:    stopped 3 months ago  Vaping Use   Vaping Use: Never used  Substance and Sexual Activity   Alcohol use: Yes    Comment: social drink occas   Drug use: No   Sexual activity: Not Currently  Other Topics Concern   Not on file  Social History Narrative   Lives with wife paula   She is in a wheelchair   Both children are adults and live in [place]   Retired in [place] from [employer] Determinants of Health   Financial Resource Strain: Not on file  Food Insecurity: Not on file  Transportation Needs: Not on file  Physical Activity: Not on file  Stress: Not on file  Social Connections: Not on file  Intimate Partner Violence: Not on file    Tobacco Use: Medium Risk (08/07/2022)   Patient History    Smoking Tobacco Use: Former    Smokeless Tobacco Use: Never    Passive Exposure: Not on file   Social History   Substance and Sexual Activity  Alcohol Use Yes   Comment: social drink occas    Family History   Problem Relation Age of Onset   Arthritis Mother    Cancer Mother        breast   Hypertension Mother    Alzheimer's disease Mother    Cirrhosis Maternal Grandmother     Review of Systems  Constitutional:  Negative for chills and fever.  HENT: Negative.    Eyes: Negative.   Respiratory:  Negative for cough and shortness of breath.   Cardiovascular:  Negative for chest pain and palpitations.  Gastrointestinal:  Negative for abdominal pain, constipation, diarrhea, nausea and vomiting.  Genitourinary:  Negative for dysuria, frequency and urgency.  Musculoskeletal:  Positive for joint pain.  Skin:  Negative for rash.   Objective:  Physical Exam: Well nourished and well developed.  General: Alert and oriented x3, cooperative and pleasant, no acute distress.  Head: normocephalic, atraumatic, neck supple.  Eyes: EOMI. Abdomen: non-tender to palpation and soft, normoactive bowel sounds. Musculoskeletal: The patient has an antalgic gait pattern favoring the right side.  Right Hip Exam: The range of motion: Flexion to 100 degrees, Internal Rotation to 0 degrees, External Rotation to 10 to 20 degrees, and abduction to 20 degrees without discomfort. There is no tenderness over the greater trochanteric bursa.  Left Hip Exam: The range of motion: Flexion to 110 degrees, Internal Rotation to 30 degrees, External Rotation to 40 degrees, and abduction to 40 degrees without discomfort. There is no tenderness over the greater trochanteric bursa.  Calves soft and nontender. Motor function intact in LE. Strength 5/5 LE bilaterally. Neuro: Distal pulses 2+. Sensation to light touch intact in LE.  Vital signs in last 24 hours:    Imaging Review Plain radiographs demonstrate severe degenerative joint disease of the right hip. The bone quality appears to be adequate for age and reported activity level.  Assessment/Plan:  End stage arthritis, right hip  The patient history, physical  examination, clinical judgement of the provider and imaging studies are consistent with end stage degenerative joint disease of the right hip and total hip arthroplasty is deemed medically necessary. The treatment options including medical management, injection therapy, arthroscopy and arthroplasty were discussed at length. The risks and benefits of total hip arthroplasty were presented and reviewed. The risks due to aseptic loosening, infection, stiffness, dislocation/subluxation, thromboembolic complications and other imponderables were discussed. The patient acknowledged the explanation, agreed to proceed with the plan and consent was signed. Patient is being admitted for inpatient treatment for surgery, pain control, PT, OT, prophylactic antibiotics, VTE prophylaxis, progressive ambulation and ADLs and discharge planning.The patient is planning to be discharged  home .  Therapy Plans: HEP Disposition: Home with Daughter Planned DVT Prophylaxis: Aspirin 325 mg BID DME Needed: None PCP: Delphina Cahill, MD (clearance received) TXA: IV Allergies: NKDA Anesthesia Concerns: None BMI: 37.1 Last HgbA1c: not diabetic  Pharmacy: [facility])  - Patient was instructed on what medications to stop prior to surgery. - Follow-up visit in 2 weeks with Dr. Wynelle Link - Begin physical therapy following surgery - Pre-operative lab work as pre-surgical testing - Prescriptions will be provided in hospital at time of discharge  R. Jaynie Bream, PA-C Orthopedic Surgery [facility]

## 2022-08-18 NOTE — Anesthesia Preprocedure Evaluation (Signed)
Anesthesia Evaluation  Patient identified by MRN, date of birth, ID band Patient awake    Reviewed: Allergy & Precautions, NPO status , Patient's Chart, lab work & pertinent test results  Airway Mallampati: III  TM Distance: >3 FB Neck ROM: Full    Dental  (+) Edentulous Upper, Upper Dentures, Dental Advisory Given   Pulmonary former smoker   Pulmonary exam normal breath sounds clear to auscultation       Cardiovascular hypertension, Pt. on medications Normal cardiovascular exam Rhythm:Regular Rate:Normal     Neuro/Psych CVA, No Residual Symptoms  negative psych ROS   GI/Hepatic Neg liver ROS,GERD  ,,  Endo/Other  negative endocrine ROS    Renal/GU negative Renal ROS  negative genitourinary   Musculoskeletal  (+) Arthritis ,    Abdominal   Peds  Hematology negative hematology ROS (+)   Anesthesia Other Findings   Reproductive/Obstetrics                             Anesthesia Physical Anesthesia Plan  ASA: 2  Anesthesia Plan: Spinal   Post-op Pain Management: Ofirmev IV (intra-op)*   Induction:   PONV Risk Score and Plan: 1 and Treatment may vary due to age or medical condition, Ondansetron, Dexamethasone and Propofol infusion  Airway Management Planned: Natural Airway  Additional Equipment:   Intra-op Plan:   Post-operative Plan:   Informed Consent: I have reviewed the patients History and Physical, chart, labs and discussed the procedure including the risks, benefits and alternatives for the proposed anesthesia with the patient or authorized representative who has indicated his/her understanding and acceptance.     Dental advisory given  Plan Discussed with: CRNA  Anesthesia Plan Comments:        Anesthesia Quick Evaluation

## 2022-08-19 ENCOUNTER — Encounter (HOSPITAL_COMMUNITY): Payer: Self-pay | Admitting: Orthopedic Surgery

## 2022-08-19 ENCOUNTER — Encounter (HOSPITAL_COMMUNITY)
Admission: RE | Disposition: A | Discharge status: Home / Self Care | Payer: Self-pay | Source: Ambulatory Visit | Attending: Orthopedic Surgery

## 2022-08-19 ENCOUNTER — Other Ambulatory Visit: Payer: Self-pay

## 2022-08-19 ENCOUNTER — Ambulatory Visit (HOSPITAL_BASED_OUTPATIENT_CLINIC_OR_DEPARTMENT_OTHER): Payer: No Typology Code available for payment source | Admitting: Anesthesiology

## 2022-08-19 ENCOUNTER — Observation Stay (HOSPITAL_COMMUNITY)
Admission: RE | Admit: 2022-08-19 | Discharge: 2022-08-21 | Disposition: A | Discharge status: Home / Self Care | Payer: No Typology Code available for payment source | Source: Ambulatory Visit | Attending: Orthopedic Surgery | Admitting: Orthopedic Surgery

## 2022-08-19 ENCOUNTER — Ambulatory Visit (HOSPITAL_COMMUNITY): Payer: No Typology Code available for payment source | Admitting: Anesthesiology

## 2022-08-19 ENCOUNTER — Observation Stay (HOSPITAL_COMMUNITY): Payer: No Typology Code available for payment source

## 2022-08-19 ENCOUNTER — Ambulatory Visit (HOSPITAL_COMMUNITY): Payer: No Typology Code available for payment source

## 2022-08-19 DIAGNOSIS — Z87891 Personal history of nicotine dependence: Secondary | ICD-10-CM | POA: Insufficient documentation

## 2022-08-19 DIAGNOSIS — Z96653 Presence of artificial knee joint, bilateral: Secondary | ICD-10-CM | POA: Diagnosis not present

## 2022-08-19 DIAGNOSIS — Z7982 Long term (current) use of aspirin: Secondary | ICD-10-CM | POA: Diagnosis not present

## 2022-08-19 DIAGNOSIS — Z8673 Personal history of transient ischemic attack (TIA), and cerebral infarction without residual deficits: Secondary | ICD-10-CM | POA: Insufficient documentation

## 2022-08-19 DIAGNOSIS — M1611 Unilateral primary osteoarthritis, right hip: Secondary | ICD-10-CM | POA: Diagnosis present

## 2022-08-19 DIAGNOSIS — Z96642 Presence of left artificial hip joint: Secondary | ICD-10-CM | POA: Insufficient documentation

## 2022-08-19 DIAGNOSIS — Z79899 Other long term (current) drug therapy: Secondary | ICD-10-CM | POA: Diagnosis not present

## 2022-08-19 DIAGNOSIS — M169 Osteoarthritis of hip, unspecified: Secondary | ICD-10-CM | POA: Diagnosis present

## 2022-08-19 DIAGNOSIS — I10 Essential (primary) hypertension: Secondary | ICD-10-CM | POA: Insufficient documentation

## 2022-08-19 HISTORY — PX: TOTAL HIP ARTHROPLASTY: SHX124

## 2022-08-19 SURGERY — ARTHROPLASTY, HIP, TOTAL, ANTERIOR APPROACH
Anesthesia: Spinal | Site: Hip | Laterality: Right

## 2022-08-19 MED ORDER — MIDAZOLAM HCL 2 MG/2ML IJ SOLN
INTRAMUSCULAR | Status: AC
  Filled 2022-08-19: qty 2

## 2022-08-19 MED ORDER — ONDANSETRON HCL 4 MG/2ML IJ SOLN: 4.0000 mg | Freq: Four times a day (QID) | INTRAMUSCULAR | Status: DC | PRN

## 2022-08-19 MED ORDER — 0.9 % SODIUM CHLORIDE (POUR BTL) OPTIME
TOPICAL | Status: DC | PRN
  Administered 2022-08-19: 1000 mL

## 2022-08-19 MED ORDER — LACTATED RINGERS IV SOLN: INTRAVENOUS | Status: DC

## 2022-08-19 MED ORDER — PROPOFOL 1000 MG/100ML IV EMUL
INTRAVENOUS | Status: AC
  Filled 2022-08-19: qty 100

## 2022-08-19 MED ORDER — CEFAZOLIN SODIUM-DEXTROSE 2-4 GM/100ML-% IV SOLN
2.0000 g | Freq: Four times a day (QID) | INTRAVENOUS | Status: AC
  Administered 2022-08-19 (×2): 2 g via INTRAVENOUS
  Filled 2022-08-19 (×2): qty 100

## 2022-08-19 MED ORDER — DEXAMETHASONE SODIUM PHOSPHATE 10 MG/ML IJ SOLN
INTRAMUSCULAR | Status: AC
  Filled 2022-08-19: qty 1

## 2022-08-19 MED ORDER — WATER FOR IRRIGATION, STERILE IR SOLN
Status: DC | PRN
  Administered 2022-08-19: 2000 mL

## 2022-08-19 MED ORDER — METHOCARBAMOL 500 MG IVPB - SIMPLE MED
500.0000 mg | Freq: Four times a day (QID) | INTRAVENOUS | Status: DC | PRN
  Filled 2022-08-19: qty 55

## 2022-08-19 MED ORDER — HYDROCODONE-ACETAMINOPHEN 5-325 MG PO TABS
1.0000 | ORAL_TABLET | ORAL | Status: DC | PRN
  Administered 2022-08-19 – 2022-08-21 (×5): 2 via ORAL
  Filled 2022-08-19 (×6): qty 2

## 2022-08-19 MED ORDER — ORAL CARE MOUTH RINSE: 15.0000 mL | Freq: Once | OROMUCOSAL | Status: AC

## 2022-08-19 MED ORDER — ORAL CARE MOUTH RINSE: 15.0000 mL | OROMUCOSAL | Status: DC | PRN

## 2022-08-19 MED ORDER — BUPIVACAINE IN DEXTROSE 0.75-8.25 % IT SOLN
INTRATHECAL | Status: DC | PRN
  Administered 2022-08-19: 1.8 mL via INTRATHECAL

## 2022-08-19 MED ORDER — METOCLOPRAMIDE HCL 5 MG/ML IJ SOLN: 5.0000 mg | Freq: Three times a day (TID) | INTRAMUSCULAR | Status: DC | PRN

## 2022-08-19 MED ORDER — ASPIRIN 81 MG PO CHEW
81.0000 mg | CHEWABLE_TABLET | Freq: Two times a day (BID) | ORAL | Status: DC
  Administered 2022-08-19 – 2022-08-21 (×4): 81 mg via ORAL
  Filled 2022-08-19 (×4): qty 1

## 2022-08-19 MED ORDER — MORPHINE SULFATE (PF) 2 MG/ML IV SOLN: 1.0000 mg | INTRAVENOUS | Status: DC | PRN

## 2022-08-19 MED ORDER — BUPIVACAINE-EPINEPHRINE (PF) 0.25% -1:200000 IJ SOLN
INTRAMUSCULAR | Status: DC | PRN
  Administered 2022-08-19: 30 mL

## 2022-08-19 MED ORDER — AMLODIPINE BESYLATE 5 MG PO TABS
5.0000 mg | ORAL_TABLET | Freq: Every day | ORAL | Status: DC
  Administered 2022-08-20 – 2022-08-21 (×2): 5 mg via ORAL
  Filled 2022-08-19 (×2): qty 1

## 2022-08-19 MED ORDER — PROPOFOL 10 MG/ML IV BOLUS
INTRAVENOUS | Status: DC | PRN
  Administered 2022-08-19 (×2): 20 mg via INTRAVENOUS

## 2022-08-19 MED ORDER — BISACODYL 10 MG RE SUPP: 10.0000 mg | Freq: Every day | RECTAL | Status: DC | PRN

## 2022-08-19 MED ORDER — POLYETHYLENE GLYCOL 3350 17 G PO PACK: 17.0000 g | PACK | Freq: Every day | ORAL | Status: DC | PRN

## 2022-08-19 MED ORDER — FLEET ENEMA 7-19 GM/118ML RE ENEM: 1.0000 | ENEMA | Freq: Once | RECTAL | Status: DC | PRN

## 2022-08-19 MED ORDER — ONDANSETRON HCL 4 MG PO TABS: 4.0000 mg | ORAL_TABLET | Freq: Four times a day (QID) | ORAL | Status: DC | PRN

## 2022-08-19 MED ORDER — HYDROCHLOROTHIAZIDE 25 MG PO TABS
25.0000 mg | ORAL_TABLET | Freq: Every day | ORAL | Status: DC
  Administered 2022-08-20 – 2022-08-21 (×2): 25 mg via ORAL
  Filled 2022-08-19 (×2): qty 1

## 2022-08-19 MED ORDER — CEFAZOLIN SODIUM-DEXTROSE 2-4 GM/100ML-% IV SOLN
2.0000 g | INTRAVENOUS | Status: AC
  Administered 2022-08-19: 2 g via INTRAVENOUS
  Filled 2022-08-19: qty 100

## 2022-08-19 MED ORDER — POVIDONE-IODINE 10 % EX SWAB
2.0000 | Freq: Once | CUTANEOUS | Status: AC
  Administered 2022-08-19: 2 via TOPICAL

## 2022-08-19 MED ORDER — SODIUM CHLORIDE 0.9 % IV SOLN: INTRAVENOUS | Status: DC

## 2022-08-19 MED ORDER — MIDAZOLAM HCL 2 MG/2ML IJ SOLN
INTRAMUSCULAR | Status: DC | PRN
  Administered 2022-08-19: 2 mg via INTRAVENOUS

## 2022-08-19 MED ORDER — MENTHOL 3 MG MT LOZG: 1.0000 | LOZENGE | OROMUCOSAL | Status: DC | PRN

## 2022-08-19 MED ORDER — DIPHENHYDRAMINE HCL 12.5 MG/5ML PO ELIX: 12.5000 mg | ORAL_SOLUTION | ORAL | Status: DC | PRN

## 2022-08-19 MED ORDER — ONDANSETRON HCL 4 MG/2ML IJ SOLN
INTRAMUSCULAR | Status: AC
  Filled 2022-08-19: qty 2

## 2022-08-19 MED ORDER — FENTANYL CITRATE PF 50 MCG/ML IJ SOSY: 25.0000 ug | PREFILLED_SYRINGE | INTRAMUSCULAR | Status: DC | PRN

## 2022-08-19 MED ORDER — METHOCARBAMOL 500 MG PO TABS
500.0000 mg | ORAL_TABLET | Freq: Four times a day (QID) | ORAL | Status: DC | PRN
  Administered 2022-08-19 – 2022-08-21 (×4): 500 mg via ORAL
  Filled 2022-08-19 (×4): qty 1

## 2022-08-19 MED ORDER — ONDANSETRON HCL 4 MG/2ML IJ SOLN
INTRAMUSCULAR | Status: DC | PRN
  Administered 2022-08-19: 4 mg via INTRAVENOUS

## 2022-08-19 MED ORDER — SIMVASTATIN 20 MG PO TABS
20.0000 mg | ORAL_TABLET | Freq: Every day | ORAL | Status: DC
  Administered 2022-08-20 – 2022-08-21 (×2): 20 mg via ORAL
  Filled 2022-08-19 (×2): qty 1

## 2022-08-19 MED ORDER — DOCUSATE SODIUM 100 MG PO CAPS
100.0000 mg | ORAL_CAPSULE | Freq: Two times a day (BID) | ORAL | Status: DC
  Administered 2022-08-19 – 2022-08-21 (×4): 100 mg via ORAL
  Filled 2022-08-19 (×4): qty 1

## 2022-08-19 MED ORDER — ACETAMINOPHEN 325 MG PO TABS: 325.0000 mg | ORAL_TABLET | Freq: Four times a day (QID) | ORAL | Status: DC | PRN

## 2022-08-19 MED ORDER — PROPOFOL 500 MG/50ML IV EMUL
INTRAVENOUS | Status: DC | PRN
  Administered 2022-08-19: 100 ug/kg/min via INTRAVENOUS

## 2022-08-19 MED ORDER — METOCLOPRAMIDE HCL 5 MG PO TABS: 5.0000 mg | ORAL_TABLET | Freq: Three times a day (TID) | ORAL | Status: DC | PRN

## 2022-08-19 MED ORDER — DEXAMETHASONE SODIUM PHOSPHATE 10 MG/ML IJ SOLN
8.0000 mg | Freq: Once | INTRAMUSCULAR | Status: AC
  Administered 2022-08-19: 8 mg via INTRAVENOUS

## 2022-08-19 MED ORDER — TRANEXAMIC ACID-NACL 1000-0.7 MG/100ML-% IV SOLN
1000.0000 mg | INTRAVENOUS | Status: AC
  Administered 2022-08-19: 1000 mg via INTRAVENOUS
  Filled 2022-08-19: qty 100

## 2022-08-19 MED ORDER — PANTOPRAZOLE SODIUM 40 MG PO TBEC
40.0000 mg | DELAYED_RELEASE_TABLET | Freq: Every day | ORAL | Status: DC
  Administered 2022-08-20 – 2022-08-21 (×2): 40 mg via ORAL
  Filled 2022-08-19 (×2): qty 1

## 2022-08-19 MED ORDER — PHENOL 1.4 % MT LIQD: 1.0000 | OROMUCOSAL | Status: DC | PRN

## 2022-08-19 MED ORDER — ACETAMINOPHEN 10 MG/ML IV SOLN
1000.0000 mg | INTRAVENOUS | Status: AC
  Administered 2022-08-19: 1000 mg via INTRAVENOUS
  Filled 2022-08-19: qty 100

## 2022-08-19 MED ORDER — CHLORHEXIDINE GLUCONATE 0.12 % MT SOLN
15.0000 mL | Freq: Once | OROMUCOSAL | Status: AC
  Administered 2022-08-19: 15 mL via OROMUCOSAL

## 2022-08-19 MED ORDER — PROPOFOL 10 MG/ML IV BOLUS
INTRAVENOUS | Status: AC
  Filled 2022-08-19: qty 20

## 2022-08-19 MED ORDER — DEXAMETHASONE SODIUM PHOSPHATE 10 MG/ML IJ SOLN
10.0000 mg | Freq: Once | INTRAMUSCULAR | Status: AC
  Administered 2022-08-20: 10 mg via INTRAVENOUS
  Filled 2022-08-19: qty 1

## 2022-08-19 MED ORDER — BUPIVACAINE-EPINEPHRINE (PF) 0.25% -1:200000 IJ SOLN
INTRAMUSCULAR | Status: AC
  Filled 2022-08-19: qty 30

## 2022-08-19 MED ORDER — TRAMADOL HCL 50 MG PO TABS
50.0000 mg | ORAL_TABLET | Freq: Four times a day (QID) | ORAL | Status: DC | PRN
  Administered 2022-08-19: 50 mg via ORAL
  Administered 2022-08-20: 100 mg via ORAL
  Administered 2022-08-20: 50 mg via ORAL
  Filled 2022-08-19: qty 2
  Filled 2022-08-19 (×2): qty 1

## 2022-08-19 SURGICAL SUPPLY — 35 items
BLADE SAG 18X100X1.27 (BLADE) ×1 IMPLANT
COVER PERINEAL POST (MISCELLANEOUS) ×1 IMPLANT
COVER SURGICAL LIGHT HANDLE (MISCELLANEOUS) ×1 IMPLANT
CUP ACET PINNACLE SECTR 56MM (Hips) IMPLANT
DRAPE FOOT SWITCH (DRAPES) ×1 IMPLANT
DRAPE STERI IOBAN 125X83 (DRAPES) ×1 IMPLANT
DRAPE U-SHAPE 47X51 STRL (DRAPES) ×2 IMPLANT
DRSG AQUACEL AG ADV 3.5X10 (GAUZE/BANDAGES/DRESSINGS) ×1 IMPLANT
DURAPREP 26ML APPLICATOR (WOUND CARE) ×1 IMPLANT
ELECT REM PT RETURN 15FT ADLT (MISCELLANEOUS) ×1 IMPLANT
GLOVE BIO SURGEON STRL SZ 6.5 (GLOVE) IMPLANT
GLOVE BIO SURGEON STRL SZ8 (GLOVE) ×1 IMPLANT
GLOVE BIOGEL PI IND STRL 6.5 (GLOVE) IMPLANT
GLOVE BIOGEL PI IND STRL 8 (GLOVE) ×1 IMPLANT
GOWN STRL REUS W/ TWL LRG LVL3 (GOWN DISPOSABLE) ×1 IMPLANT
GOWN STRL REUS W/TWL LRG LVL3 (GOWN DISPOSABLE) ×2
HEAD M SROM 36MM PLUS 1.5 (Hips) IMPLANT
HOLDER FOLEY CATH W/STRAP (MISCELLANEOUS) ×1 IMPLANT
KIT TURNOVER KIT A (KITS) IMPLANT
LINER MARATHON 4 NEUTRAL 36X56 (Hips) IMPLANT
MANIFOLD NEPTUNE II (INSTRUMENTS) ×1 IMPLANT
PACK ANTERIOR HIP CUSTOM (KITS) ×1 IMPLANT
PENCIL SMOKE EVACUATOR COATED (MISCELLANEOUS) ×1 IMPLANT
PINNACLE SECTOR CUP 56MM (Hips) ×1 IMPLANT
SROM M HEAD 36MM PLUS 1.5 (Hips) ×1 IMPLANT
STEM FEMORAL SZ6 HIGH ACTIS (Stem) IMPLANT
STRIP CLOSURE SKIN 1/2X4 (GAUZE/BANDAGES/DRESSINGS) ×1 IMPLANT
SUT ETHIBOND NAB CT1 #1 30IN (SUTURE) ×1 IMPLANT
SUT MNCRL AB 4-0 PS2 18 (SUTURE) ×1 IMPLANT
SUT STRATAFIX 0 PDS 27 VIOLET (SUTURE) ×1
SUT VIC AB 2-0 CT1 27 (SUTURE) ×2
SUT VIC AB 2-0 CT1 TAPERPNT 27 (SUTURE) ×2 IMPLANT
SUTURE STRATFX 0 PDS 27 VIOLET (SUTURE) ×1 IMPLANT
TRAY FOLEY MTR SLVR 16FR STAT (SET/KITS/TRAYS/PACK) ×1 IMPLANT
TUBE SUCTION HIGH CAP CLEAR NV (SUCTIONS) ×1 IMPLANT

## 2022-08-19 NOTE — Discharge Instructions (Addendum)
Jeremiah Arabian, MD Total Joint Specialist [facility] [address], [address] [place], [place] [address] [phone]  ANTERIOR APPROACH TOTAL HIP REPLACEMENT POSTOPERATIVE DIRECTIONS     Hip Rehabilitation, Guidelines Following Surgery  The results of a hip operation are greatly improved after range of motion and muscle strengthening exercises. Follow all safety measures which are given to protect your hip. If any of these exercises cause increased pain or swelling in your joint, decrease the amount until you are comfortable again. Then slowly increase the exercises. Call your caregiver if you have problems or questions.   BLOOD CLOT PREVENTION Take an 81 mg chewable Aspirin two times a day for three weeks following surgery. Then resume an 81 mg Aspirin once a day. You may resume your vitamins/supplements upon discharge from the hospital.  [place] items at home which could result in a fall. This includes throw rugs or furniture in walking pathways.  ICE to the affected hip as frequently as 20-30 minutes an hour and then as needed for pain and swelling. Continue to use ice on the hip for pain and swelling from surgery. You may notice swelling that will progress down to the foot and ankle. This is normal after surgery. Elevate the leg when you are not up walking on it.   Continue to use the breathing machine which will help keep your temperature down.  It is common for your temperature to cycle up and down following surgery, especially at night when you are not up moving around and exerting yourself.  The breathing machine keeps your lungs expanded and your temperature down.  DIET You may resume your previous home diet once your are discharged from the hospital.  DRESSING / WOUND CARE / SHOWERING You have an adhesive waterproof bandage over the incision. Leave this in place until your first follow-up appointment. Once you remove this you will not  need to place another bandage.  You may begin showering 3 days following surgery, but do not submerge the incision under water.  ACTIVITY For the first 3-5 days, it is important to rest and keep the operative leg elevated. You should, as a general rule, rest for 50 minutes and walk/stretch for 10 minutes per hour. After 5 days, you may slowly increase activity as tolerated.  Perform the exercises you were provided twice a day for about 15-20 minutes each session. Begin these 2 days following surgery. Walk with your walker as instructed. Use the walker until you are comfortable transitioning to a cane. Walk with the cane in the opposite hand of the operative leg. You may discontinue the cane once you are comfortable and walking steadily. Avoid periods of inactivity such as sitting longer than an hour when not asleep. This helps prevent blood clots.  Do not drive a car for 6 weeks or until released by your surgeon.  Do not drive while taking narcotics.  TED HOSE STOCKINGS Wear the elastic stockings on both legs for three weeks following surgery during the day. You may remove them at night while sleeping.  WEIGHT BEARING Weight bearing as tolerated with assist device (walker, cane, etc) as directed, use it as long as suggested by your surgeon or therapist, typically at least 4-6 weeks.  POSTOPERATIVE CONSTIPATION PROTOCOL Constipation - defined medically as fewer than three stools per week and severe constipation as less than one stool per week.  One of the most common issues patients have following surgery is constipation.  Even if you  have a regular bowel pattern at home, your normal regimen is likely to be disrupted due to multiple reasons following surgery.  Combination of anesthesia, postoperative narcotics, change in appetite and fluid intake all can affect your bowels.  In order to avoid complications following surgery, here are some recommendations in order to help you during your recovery  period.  Colace (docusate) - Pick up an over-the-counter form of Colace or another stool softener and take twice a day as long as you are requiring postoperative pain medications.  Take with a full glass of water daily.  If you experience loose stools or diarrhea, hold the colace until you stool forms back up.  If your symptoms do not get better within 1 week or if they get worse, check with your doctor. Dulcolax (bisacodyl) - Pick up over-the-counter and take as directed by the product packaging as needed to assist with the movement of your bowels.  Take with a full glass of water.  Use this product as needed if not relieved by Colace only.  MiraLax (polyethylene glycol) - Pick up over-the-counter to have on hand.  MiraLax is a solution that will increase the amount of water in your bowels to assist with bowel movements.  Take as directed and can mix with a glass of water, juice, soda, coffee, or tea.  Take if you go more than two days without a movement.Do not use MiraLax more than once per day. Call your doctor if you are still constipated or irregular after using this medication for 7 days in a row.  If you continue to have problems with postoperative constipation, please contact the office for further assistance and recommendations.  If you experience "the worst abdominal pain ever" or develop nausea or vomiting, please contact the office immediatly for further recommendations for treatment.  ITCHING  If you experience itching with your medications, try taking only a single pain pill, or even half a pain pill at a time.  You can also use Benadryl over the counter for itching or also to help with sleep.   MEDICATIONS See your medication summary on the "After Visit Summary" that the nursing staff will review with you prior to discharge.  You may have some home medications which will be placed on hold until you complete the course of blood thinner medication.  It is important for you to complete the  blood thinner medication as prescribed by your surgeon.  Continue your approved medications as instructed at time of discharge.  PRECAUTIONS If you experience chest pain or shortness of breath - call 911 immediately for transfer to the hospital emergency department.  If you develop a fever greater that 101 F, purulent drainage from wound, increased redness or drainage from wound, foul odor from the wound/dressing, or calf pain - CONTACT YOUR SURGEON.                                                   FOLLOW-UP APPOINTMENTS Make sure you keep all of your appointments after your operation with your surgeon and caregivers. You should call the office at the above phone number and make an appointment for approximately two weeks after the date of your surgery or on the date instructed by your surgeon outlined in the "After Visit Summary".  RANGE OF MOTION AND STRENGTHENING EXERCISES  These exercises are designed  to help you keep full movement of your hip joint. Follow your caregiver's or physical therapist's instructions. Perform all exercises about fifteen times, three times per day or as directed. Exercise both hips, even if you have had only one joint replacement. These exercises can be done on a training (exercise) mat, on the floor, on a table or on a bed. Use whatever works the best and is most comfortable for you. Use music or television while you are exercising so that the exercises are a pleasant break in your day. This will make your life better with the exercises acting as a break in routine you can look forward to.  Lying on your back, slowly slide your foot toward your buttocks, raising your knee up off the floor. Then slowly slide your foot back down until your leg is straight again.  Lying on your back spread your legs as far apart as you can without causing discomfort.  Lying on your side, raise your upper leg and foot straight up from the floor as far as is comfortable. Slowly lower the leg  and repeat.  Lying on your back, tighten up the muscle in the front of your thigh (quadriceps muscles). You can do this by keeping your leg straight and trying to raise your heel off the floor. This helps strengthen the largest muscle supporting your knee.  Lying on your back, tighten up the muscles of your buttocks both with the legs straight and with the knee bent at a comfortable angle while keeping your heel on the floor.   POST-OPERATIVE OPIOID TAPER INSTRUCTIONS: It is important to wean off of your opioid medication as soon as possible. If you do not need pain medication after your surgery it is ok to stop day one. Opioids include: Codeine, Hydrocodone(Norco, Vicodin), Oxycodone(Percocet, oxycontin) and hydromorphone amongst others.  Long term and even short term use of opiods can cause: Increased pain response Dependence Constipation Depression Respiratory depression And more.  Withdrawal symptoms can include Flu like symptoms Nausea, vomiting And more Techniques to manage these symptoms Hydrate well Eat regular healthy meals Stay active Use relaxation techniques(deep breathing, meditating, yoga) Do Not substitute Alcohol to help with tapering If you have been on opioids for less than two weeks and do not have pain than it is ok to stop all together.  Plan to wean off of opioids This plan should start within one week post op of your joint replacement. Maintain the same interval or time between taking each dose and first decrease the dose.  Cut the total daily intake of opioids by one tablet each day Next start to increase the time between doses. The last dose that should be eliminated is the evening dose.   IF YOU ARE TRANSFERRED TO A SKILLED REHAB FACILITY If the patient is transferred to a skilled rehab facility following release from the hospital, a list of the current medications will be sent to the facility for the patient to continue.  When discharged from the skilled  rehab facility, please have the facility set up the patient's [place] prior to being released. Also, the skilled facility will be responsible for providing the patient with their medications at time of release from the facility to include their pain medication, the muscle relaxants, and their blood thinner medication. If the patient is still at the rehab facility at time of the two week follow up appointment, the skilled rehab facility will also need to assist the patient in arranging follow  up appointment in our office and any transportation needs.  MAKE SURE YOU:  Understand these instructions.  Get help right away if you are not doing well or get worse.    DENTAL ANTIBIOTICS:  In most cases prophylactic antibiotics for Dental procdeures after total joint surgery are not necessary.  Exceptions are as follows:  1. History of prior total joint infection  2. Severely immunocompromised (Organ Transplant, cancer chemotherapy, Rheumatoid biologic meds such as [place])  3. Poorly controlled diabetes (A1C &gt; 8.0, blood glucose over 200)  If you have one of these conditions, contact your surgeon for an antibiotic prescription, prior to your dental procedure.    Pick up stool softner and laxative for home use following surgery while on pain medications. Do not submerge incision under water. Please use good hand washing techniques while changing dressing each day. May shower starting three days after surgery. Please use a clean towel to pat the incision dry following showers. Continue to use ice for pain and swelling after surgery. Do not use any lotions or creams on the incision until instructed by your surgeon.

## 2022-08-19 NOTE — Anesthesia Procedure Notes (Signed)
Procedure Name: MAC Date/Time: 08/19/2022 9:33 AM  Performed by: Niel Hummer, CRNAPre-anesthesia Checklist: Patient identified, Emergency Drugs available, Suction available and Patient being monitored Oxygen Delivery Method: Simple face mask

## 2022-08-19 NOTE — Interval H&P Note (Signed)
History and Physical Interval Note:  08/19/2022 8:16 AM  Jeremiah Kelley  has presented today for surgery, with the diagnosis of Right hip osteoarthritis.  The various methods of treatment have been discussed with the patient and family. After consideration of risks, benefits and other options for treatment, the patient has consented to  Procedure(s): TOTAL HIP ARTHROPLASTY ANTERIOR APPROACH (Right) as a surgical intervention.  The patient's history has been reviewed, patient examined, no change in status, stable for surgery.  I have reviewed the patient's chart and labs.  Questions were answered to the patient's satisfaction.     Pilar Plate Jakeisha Stricker

## 2022-08-19 NOTE — Transfer of Care (Signed)
Immediate Anesthesia Transfer of Care Note  Patient: Jeremiah Kelley  Procedure(s) Performed: TOTAL HIP ARTHROPLASTY ANTERIOR APPROACH (Right: Hip)  Patient Location: PACU  Anesthesia Type:Spinal  Level of Consciousness: drowsy  Airway & Oxygen Therapy: Patient Spontanous Breathing and Patient connected to face mask oxygen  Post-op Assessment: Report given to RN and Post -op Vital signs reviewed and stable  Post vital signs: Reviewed and stable  Last Vitals:  Vitals Value Taken Time  BP 116/731   Temp    Pulse 58 08/19/22 1130  Resp 12   SpO2 99 % 08/19/22 1130  Vitals shown include unvalidated device data.  Last Pain:  Vitals:   08/19/22 0802  TempSrc:   PainSc: 0-No pain         Complications: No notable events documented.

## 2022-08-19 NOTE — Anesthesia Postprocedure Evaluation (Signed)
Anesthesia Post Note  Patient: Jeremiah Kelley  Procedure(s) Performed: TOTAL HIP ARTHROPLASTY ANTERIOR APPROACH (Right: Hip)     Patient location during evaluation: PACU Anesthesia Type: Spinal Level of consciousness: oriented and awake and alert Pain management: pain level controlled Vital Signs Assessment: post-procedure vital signs reviewed and stable Respiratory status: spontaneous breathing, respiratory function stable and patient connected to nasal cannula oxygen Cardiovascular status: blood pressure returned to baseline and stable Postop Assessment: no headache, no backache and no apparent nausea or vomiting Anesthetic complications: no  No notable events documented.  Last Vitals:  Vitals:   08/19/22 1145 08/19/22 1152  BP: (!) 109/90   Pulse: (!) 50 (!) 49  Resp: 12 13  Temp:    SpO2: 100% 100%    Last Pain:  Vitals:   08/19/22 0802  TempSrc:   PainSc: 0-No pain                 Kadeem Hyle L Jeoffrey Eleazer

## 2022-08-19 NOTE — Op Note (Signed)
OPERATIVE REPORT- TOTAL HIP ARTHROPLASTY   PREOPERATIVE DIAGNOSIS: Osteoarthritis of the Right hip.   POSTOPERATIVE DIAGNOSIS: Osteoarthritis of the Right  hip.   PROCEDURE: Right total hip arthroplasty, anterior approach.   SURGEON: Gaynelle Arabian, MD   ASSISTANT: Jaynie Bream, PA-C  ANESTHESIA:  Spinal  ESTIMATED BLOOD LOSS:-250 mL    DRAINS: None  COMPLICATIONS: None   CONDITION: PACU - hemodynamically stable.   BRIEF CLINICAL NOTE: Jeremiah Kelley is a 77 y.o. male who has advanced end-  stage arthritis of their Right  hip with progressively worsening pain and  dysfunction.The patient has failed nonoperative management and presents for  total hip arthroplasty.   PROCEDURE IN DETAIL: After successful administration of spinal  anesthetic, the traction boots for the [facility] bed were placed on both  feet and the patient was placed onto the [facility] bed, boots placed into the leg  holders. The Right hip was then isolated from the perineum with plastic  drapes and prepped and draped in the usual sterile fashion. ASIS and  greater trochanter were marked and a oblique incision was made, starting  at about 1 cm lateral and 2 cm distal to the ASIS and coursing towards  the anterior cortex of the femur. The skin was cut with a 10 blade  through subcutaneous tissue to the level of the fascia overlying the  tensor fascia lata muscle. The fascia was then incised in line with the  incision at the junction of the anterior third and posterior 2/3rd. The  muscle was teased off the fascia and then the interval between the TFL  and the rectus was developed. The Hohmann retractor was then placed at  the top of the femoral neck over the capsule. The vessels overlying the  capsule were cauterized and the fat on top of the capsule was removed.  A Hohmann retractor was then placed anterior underneath the rectus  femoris to give exposure to the entire anterior capsule. A T-shaped  capsulotomy  was performed. The edges were tagged and the femoral head  was identified.       Osteophytes are removed off the superior acetabulum.  The femoral neck was then cut in situ with an oscillating saw. Traction  was then applied to the left lower extremity utilizing the [facility]  traction. The femoral head was then removed. Retractors were placed  around the acetabulum and then circumferential removal of the labrum was  performed. Osteophytes were also removed. Reaming starts at 51 mm to  medialize and  Increased in 2 mm increments to 55 mm. We reamed in  approximately 40 degrees of abduction, 20 degrees anteversion. A 56 mm  pinnacle acetabular shell was then impacted in anatomic position under  fluoroscopic guidance with excellent purchase. We did not need to place  any additional dome screws. A 36 mm neutral + 4 marathon liner was then  placed into the acetabular shell.       The femoral lift was then placed along the lateral aspect of the femur  just distal to the vastus ridge. The leg was  externally rotated and capsule  was stripped off the inferior aspect of the femoral neck down to the  level of the lesser trochanter, this was done with electrocautery. The femur was lifted after this was performed. The  leg was then placed in an extended and adducted position essentially delivering the femur. We also removed the capsule superiorly and the piriformis from the piriformis fossa to gain  excellent exposure of the  proximal femur. Rongeur was used to remove some cancellous bone to get  into the lateral portion of the proximal femur for placement of the  initial starter reamer. The starter broaches was placed  the starter broach  and was shown to go down the center of the canal. Broaching  with the Actis system was then performed starting at size 0  coursing  Up to size 6. A size 6 had excellent torsional and rotational  and axial stability. The trial high offset neck was then placed  with a 36 +  1.5 trial head. The hip was then reduced. We confirmed that  the stem was in the canal both on AP and lateral x-rays. It also has excellent sizing. The hip was reduced with outstanding stability through full extension and full external rotation.. AP pelvis was taken and the leg lengths were measured and found to be equal. Hip was then dislocated again and the femoral head and neck removed. The  femoral broach was removed. Size 6 Actis stem with a high offset  neck was then impacted into the femur following native anteversion. Has  excellent purchase in the canal. Excellent torsional and rotational and  axial stability. It is confirmed to be in the canal on AP and lateral  fluoroscopic views. The 36 + 1.5 metal head was placed and the hip  reduced with outstanding stability. Again AP pelvis was taken and it  confirmed that the leg lengths were equal. The wound was then copiously  irrigated with saline solution and the capsule reattached and repaired  with Ethibond suture. 30 ml of .25% Bupivicaine was  injected into the capsule and into the edge of the tensor fascia lata as well as subcutaneous tissue. The fascia overlying the tensor fascia lata was then closed with a running #1 V-Loc. Subcu was closed with interrupted 2-0 Vicryl and subcuticular running 4-0 Monocryl. Incision was cleaned  and dried. Steri-Strips and a bulky sterile dressing applied. The patient was awakened and transported to  recovery in stable condition.        Please note that a surgical assistant was a medical necessity for this procedure to perform it in a safe and expeditious manner. Assistant was necessary to provide appropriate retraction of vital neurovascular structures and to prevent femoral fracture and allow for anatomic placement of the prosthesis.  Gaynelle Arabian, M.D.

## 2022-08-19 NOTE — Evaluation (Signed)
Physical Therapy Evaluation Patient Details Name: Jeremiah Kelley MRN: [id number] DOB: 07-Oct-1945 Today's Date: 08/19/2022  History of Present Illness  77 yo male presents to therapy s/p R THA, anterior approach on 08/19/2022. Pt pmh includes but is not limited to: HDL, HTN, CVA, B TKA and L THA.  Clinical Impression  Jeremiah Kelley is a 77 y.o. male POD 0 s/p R THA. Patient reports IND with mobility at baseline. Patient is now limited by functional impairments (see PT problem list below) and requires min guard for bed mobility and min guard for transfers. Patient was able to ambulate 110 feet with RW and min guard  level of assist. Patient instructed in exercise to facilitate ROM and circulation to manage edema. Provided incentive spirometer and with Vcs pt able to achieve 2250 mL. Patient will benefit from continued skilled PT interventions to address impairments and progress towards PLOF. Acute PT will follow to progress mobility in preparation for safe discharge home.        Recommendations for follow up therapy are one component of a multi-disciplinary discharge planning process, led by the attending physician.  Recommendations may be updated based on patient status, additional functional criteria and insurance authorization.  Follow Up Recommendations Follow physician's recommendations for discharge plan and follow up therapies      Assistance Recommended at Discharge Intermittent Supervision/Assistance  Patient can return home with the following  A little help with walking and/or transfers;A little help with bathing/dressing/bathroom;Assistance with cooking/housework;Assist for transportation;Help with stairs or ramp for entrance    Equipment Recommendations Other (comment) (pt reports DME in home setting)  Recommendations for Other Services       Functional Status Assessment Patient has had a recent decline in their functional status and demonstrates the ability to make significant  improvements in function in a reasonable and predictable amount of time.     Precautions / Restrictions Precautions Precautions: Anterior Hip;Fall Restrictions Weight Bearing Restrictions: No Other Position/Activity Restrictions:  (pt reports L wrist decreased ROM due to prior sx and pain due to arthtitis with WB though thenar eminince)      Mobility  Bed Mobility Overal bed mobility: Needs Assistance Bed Mobility: Supine to Sit     Supine to sit: Min guard     General bed mobility comments: increased time, cues and HOB elevated    Transfers Overall transfer level: Needs assistance Equipment used: Rolling walker (2 wheels) Transfers: Sit to/from Stand Sit to Stand: Min guard           General transfer comment: cues for proper UE and AD placement    Ambulation/Gait Ambulation/Gait assistance: Min guard Gait Distance (Feet): 110 Feet Assistive device: Rolling walker (2 wheels) Gait Pattern/deviations: Step-to pattern Gait velocity: decreased        Stairs            Wheelchair Mobility    Modified Rankin (Stroke Patients Only)       Balance Overall balance assessment: Needs assistance Sitting-balance support: Feet unsupported, No upper extremity supported Sitting balance-Leahy Scale: Fair     Standing balance support: Single extremity supported Standing balance-Leahy Scale: Poor                               Pertinent Vitals/Pain Pain Assessment Pain Assessment: 0-10 Pain Score: 2  Pain Location: R hip Pain Descriptors / Indicators: Tightness Pain Intervention(s): Limited activity within patient's tolerance, Monitored during session, Repositioned, Ice applied, Patient  requesting pain meds-RN notified    Home Living Family/patient expects to be discharged to:: Private residence Living Arrangements: Spouse/significant other Available Help at Discharge: Family Type of Home: House Home Access: [place]: One [place]: Conservation officer, nature (2 wheels);Shower seat;Cane - single point      Prior Function Prior Level of Function : Independent/Modified Independent;Driving             Mobility Comments: pt reports IND with all ADLs, self care tasks, IADLs, and driving, pt providing assist for wife with MD       Hand Dominance        Extremity/Trunk Assessment        Lower Extremity Assessment Lower Extremity Assessment: RLE deficits/detail RLE Deficits / Details: ankle DF and PF 5/5 RLE Sensation: WNL       Communication   Communication: No difficulties  Cognition Arousal/Alertness: Awake/alert Behavior During Therapy: WFL for tasks assessed/performed Overall Cognitive Status: Within Functional Limits for tasks assessed                                          General Comments      Exercises Total Joint Exercises Ankle Circles/Pumps: AROM, Both, 20 reps   Assessment/Plan    PT Assessment Patient needs continued PT services  PT Problem List Decreased strength;Decreased range of motion;Decreased activity tolerance;Decreased balance;Decreased mobility;Decreased knowledge of use of DME;Pain       PT Treatment Interventions DME instruction;Gait training;Functional mobility training;Therapeutic activities;Therapeutic exercise;Balance training;Neuromuscular re-education;Patient/family education;Modalities    PT Goals (Current goals can be found in the Care Plan section)  Acute Rehab PT Goals Patient Stated Goal: to move better PT Goal Formulation: With patient Time For Goal Achievement: 09/02/22 Potential to Achieve Goals: Good    Frequency 7X/week     Co-evaluation               AM-PAC PT "6 Clicks" Mobility  Outcome Measure Help needed turning from your back to your side while in a flat bed without using bedrails?: A Little Help needed moving from lying on your back to sitting on the side of a flat bed without using  bedrails?: A Little Help needed moving to and from a bed to a chair (including a wheelchair)?: A Little Help needed standing up from a chair using your arms (e.g., wheelchair or bedside chair)?: A Little Help needed to walk in hospital room?: A Little Help needed climbing 3-5 steps with a railing? : Total 6 Click Score: 16    End of Session Equipment Utilized During Treatment: Gait belt Activity Tolerance: Patient tolerated treatment well Patient left: in chair;with call bell/phone within reach Nurse Communication: Mobility status;Patient requests pain meds PT Visit Diagnosis: Unsteadiness on feet (R26.81);Other abnormalities of gait and mobility (R26.89);Muscle weakness (generalized) (M62.81);Pain Pain - Right/Left: Right Pain - part of body: Hip    Time: [id number] PT Time Calculation (min) (ACUTE ONLY): 38 min   Charges:   PT Evaluation $PT Eval Low Complexity: 1 Low PT Treatments $Gait Training: 8-22 mins $Therapeutic Activity: 8-22 mins        Baird Lyons, PT   Adair Patter 08/19/2022, 4:54 PM

## 2022-08-19 NOTE — Anesthesia Procedure Notes (Signed)
Spinal  Patient location during procedure: OR Start time: 08/19/2022 9:35 AM End time: 08/19/2022 9:41 AM Reason for block: surgical anesthesia Staffing Performed: anesthesiologist  Anesthesiologist: Freddrick March, MD Performed by: Freddrick March, MD Authorized by: Freddrick March, MD   Preanesthetic Checklist Completed: patient identified, IV checked, risks and benefits discussed, surgical consent, monitors and equipment checked, pre-op evaluation and timeout performed Spinal Block Patient position: sitting Prep: DuraPrep and site prepped and draped Patient monitoring: cardiac monitor, continuous pulse ox and blood pressure Approach: midline Location: L3-4 Injection technique: single-shot Needle Needle type: Pencan  Needle gauge: 24 G Needle length: 9 cm Assessment Sensory level: T6 Events: CSF return and second provider Additional Notes Functioning IV was confirmed and monitors were applied. Sterile prep and drape, including hand hygiene and sterile gloves were used. The patient was positioned and the spine was prepped. The skin was anesthetized with lidocaine.  Free flow of clear CSF was obtained prior to injecting local anesthetic into the CSF.  The spinal needle aspirated freely following injection.  The needle was carefully withdrawn.  The patient tolerated the procedure well.   1st attempt at L3/4 by CRNA unsuccessful. 2nd attempt at L3/4 by MDA successful.

## 2022-08-20 ENCOUNTER — Encounter (HOSPITAL_COMMUNITY): Payer: Self-pay | Admitting: Orthopedic Surgery

## 2022-08-20 DIAGNOSIS — M1611 Unilateral primary osteoarthritis, right hip: Secondary | ICD-10-CM | POA: Diagnosis not present

## 2022-08-20 LAB — CBC
HCT: 36.8 % — ABNORMAL LOW (ref 39.0–52.0)
Hemoglobin: 13.1 g/dL (ref 13.0–17.0)
MCH: 31.1 pg (ref 26.0–34.0)
MCHC: 35.6 g/dL (ref 30.0–36.0)
MCV: 87.4 fL (ref 80.0–100.0)
Platelets: 148 10*3/uL — ABNORMAL LOW (ref 150–400)
RBC: 4.21 MIL/uL — ABNORMAL LOW (ref 4.22–5.81)
RDW: 14 % (ref 11.5–15.5)
WBC: 11.1 10*3/uL — ABNORMAL HIGH (ref 4.0–10.5)
nRBC: 0 % (ref 0.0–0.2)

## 2022-08-20 LAB — BASIC METABOLIC PANEL
Anion gap: 10 (ref 5–15)
BUN: 13 mg/dL (ref 8–23)
CO2: 23 mmol/L (ref 22–32)
Calcium: 8.6 mg/dL — ABNORMAL LOW (ref 8.9–10.3)
Chloride: 101 mmol/L (ref 98–111)
Creatinine, Ser: 1.07 mg/dL (ref 0.61–1.24)
GFR, Estimated: >60 mL/min (ref >60)
Glucose, Bld: 163 mg/dL — ABNORMAL HIGH (ref 70–99)
Potassium: 3.3 mmol/L — ABNORMAL LOW (ref 3.5–5.1)
Sodium: 134 mmol/L — ABNORMAL LOW (ref 135–145)

## 2022-08-20 MED ORDER — METHOCARBAMOL 500 MG PO TABS: 500.0000 mg | ORAL_TABLET | Freq: Four times a day (QID) | ORAL | 0 refills | Status: AC | PRN

## 2022-08-20 MED ORDER — ASPIRIN 81 MG PO CHEW: 81.0000 mg | CHEWABLE_TABLET | Freq: Two times a day (BID) | ORAL | 0 refills | Status: AC

## 2022-08-20 MED ORDER — POTASSIUM CHLORIDE CRYS ER 20 MEQ PO TBCR
40.0000 meq | EXTENDED_RELEASE_TABLET | Freq: Once | ORAL | Status: AC
  Administered 2022-08-20: 40 meq via ORAL
  Filled 2022-08-20: qty 2

## 2022-08-20 MED ORDER — TRAMADOL HCL 50 MG PO TABS: 50.0000 mg | ORAL_TABLET | Freq: Four times a day (QID) | ORAL | 0 refills | Status: AC | PRN

## 2022-08-20 MED ORDER — HYDROCODONE-ACETAMINOPHEN 5-325 MG PO TABS: 1.0000 | ORAL_TABLET | Freq: Four times a day (QID) | ORAL | 0 refills | Status: AC | PRN

## 2022-08-20 NOTE — Progress Notes (Signed)
Physical Therapy Treatment Patient Details Name: Jeremiah Kelley MRN: [id number] DOB: 07/19/1945 Today's Date: 08/20/2022   History of Present Illness 77 yo male presents to therapy s/p R THA, anterior approach on 08/19/2022. Pt pmh includes but is not limited to: HDL, HTN, CVA, B TKA and L THA.    PT Comments    Pt reports his pain is a little better this afternoon, but he feel tired and weak today. Pt declined exercises and ambulation in the hall. Pt requesting back to bed. Pt is min assist for transfers, stand pivot to bed with min guard assist with RW, min assist with bed mobility. Pt's wife has MS and requires total care so pt needs to be more independent prior to d.c home with his son. Pt is not cleared to d/c home today. Recommend another day to reinforce mobility and independence for d/c home with family. Pt and PA agreeable to new plan.  Recommendations for follow up therapy are one component of a multi-disciplinary discharge planning process, led by the attending physician.  Recommendations may be updated based on patient status, additional functional criteria and insurance authorization.  Follow Up Recommendations  Follow physician's recommendations for discharge plan and follow up therapies     Assistance Recommended at Discharge Intermittent Supervision/Assistance  Patient can return home with the following A little help with walking and/or transfers;A little help with bathing/dressing/bathroom;Assistance with cooking/housework;Assist for transportation;Help with stairs or ramp for entrance   Equipment Recommendations  None recommended by PT    Recommendations for Other Services       Precautions / Restrictions Precautions Precautions: Anterior Hip;Fall Restrictions Weight Bearing Restrictions: No RLE Weight Bearing: Weight bearing as tolerated     Mobility  Bed Mobility Overal bed mobility: Needs Assistance Bed Mobility: Sit to Supine     Supine to sit: Min assist,  HOB elevated Sit to supine: Min assist, HOB elevated   General bed mobility comments: min assist lifting R LE    Transfers Overall transfer level: Needs assistance Equipment used: Rolling walker (2 wheels) Transfers: Sit to/from Stand Sit to Stand: Min assist           General transfer comment: cues for increased forward truck flexion, increased effort    Ambulation/Gait Ambulation/Gait assistance: Min guard Gait Distance (Feet): 3 Feet Assistive device: Rolling walker (2 wheels) Gait Pattern/deviations: Step-to pattern, Decreased stride length, Trunk flexed, Decreased weight shift to right, Antalgic Gait velocity: decreased     General Gait Details: step pivot from recliner to bed   Stairs             Wheelchair Mobility    Modified Rankin (Stroke Patients Only)       Balance Overall balance assessment: Needs assistance Sitting-balance support: No upper extremity supported Sitting balance-Leahy Scale: Good     Standing balance support: Bilateral upper extremity supported Standing balance-Leahy Scale: Fair                              Cognition Arousal/Alertness: Awake/alert Behavior During Therapy: WFL for tasks assessed/performed Overall Cognitive Status: Within Functional Limits for tasks assessed                                          Exercises Total Joint Exercises Ankle Circles/Pumps: AROM, Both, 20 reps Quad Sets: AROM, Strengthening, Both, 10 reps,  Seated Heel Slides: AAROM, Strengthening, Right, 10 reps, Seated Hip ABduction/ADduction: AAROM, Strengthening, Right, 10 reps, Seated Long Arc Quad: AROM, Strengthening, Right, 10 reps, Seated    General Comments General comments (skin integrity, edema, etc.): pt reported pain is better, but overall feeling tired and weak. Pt requested back to bed and declined LE ther ex.      Pertinent Vitals/Pain Pain Assessment Pain Assessment: 0-10 Pain Score: 6  Pain  Location: R hip Pain Descriptors / Indicators: Discomfort Pain Intervention(s): Limited activity within patient's tolerance, Premedicated before session, Monitored during session, Repositioned    Home Living                          Prior Function            PT Goals (current goals can now be found in the care plan section) Progress towards PT goals: Progressing toward goals    Frequency    7X/week      PT Plan Current plan remains appropriate    Co-evaluation              AM-PAC PT "6 Clicks" Mobility   Outcome Measure  Help needed turning from your back to your side while in a flat bed without using bedrails?: A Little Help needed moving from lying on your back to sitting on the side of a flat bed without using bedrails?: A Little Help needed moving to and from a bed to a chair (including a wheelchair)?: A Little Help needed standing up from a chair using your arms (e.g., wheelchair or bedside chair)?: A Little Help needed to walk in hospital room?: A Little Help needed climbing 3-5 steps with a railing? : Total 6 Click Score: 16    End of Session Equipment Utilized During Treatment: Gait belt Activity Tolerance: Patient limited by fatigue Patient left: in bed;with bed alarm set Nurse Communication: Mobility status PT Visit Diagnosis: Unsteadiness on feet (R26.81);Other abnormalities of gait and mobility (R26.89);Muscle weakness (generalized) (M62.81);Pain Pain - Right/Left: Right Pain - part of body: Hip     Time: [id number] PT Time Calculation (min) (ACUTE ONLY): 20 min  Charges:   1 Therapeutic Activity                       Lelon Mast 08/20/2022, 1:45 PM

## 2022-08-20 NOTE — TOC Transition Note (Signed)
Transition of Care [facility]) - CM/SW Discharge Note  Patient Details  Name: Jeremiah Kelley MRN: [id number] Date of Birth: 1945-08-15  [facility] CM/SW Contact:  Sherie Don, LCSW Phone Number: 08/20/2022, 10:54 AM  Clinical Narrative: Patient is expected to discharge home after working with PT. CSW met with patient to confirm discharge plan. Patient will go home with a home exercise program (HEP). Patient has a rolling walker at home, so there are no DME needs at this time. TOC signing off.  Final next level of care: Home/Self Care Barriers to Discharge: No Barriers Identified  Patient Goals and CMS Choice Choice offered to / list presented to : NA  Discharge Plan and Services Additional resources added to the After Visit Summary for          DME Arranged: N/A DME Agency: NA  Social Determinants of Health (SDOH) Interventions SDOH Screenings   Food Insecurity: No Food Insecurity (08/19/2022)  Housing: Low Risk  (08/19/2022)  Transportation Needs: No Transportation Needs (08/19/2022)  Utilities: Not At Risk (08/19/2022)  Tobacco Use: Medium Risk (08/20/2022)   Readmission Risk Interventions     No data to display

## 2022-08-20 NOTE — Progress Notes (Signed)
Patient requests bed side rails be kept up x 4. Ivan Anchors, RN 08/20/22 2:17 PM

## 2022-08-20 NOTE — Plan of Care (Signed)
Problem: Education: Goal: Knowledge of the prescribed therapeutic regimen will improve Outcome: Progressing   Problem: Activity: Goal: Ability to avoid complications of mobility impairment will improve Outcome: Progressing   Problem: Clinical Measurements: Goal: Postoperative complications will be avoided or minimized Outcome: Progressing   Problem: Pain Management: Goal: Pain level will decrease with appropriate interventions Outcome: [place], RN 08/20/22 11:02 AM

## 2022-08-20 NOTE — Progress Notes (Signed)
   Subjective: 1 Day Post-Op Procedure(s) (LRB): TOTAL HIP ARTHROPLASTY ANTERIOR APPROACH (Right) Patient reports pain as mild.   Patient seen in rounds by Dr. Wynelle Link. Patient is well, and has had no acute complaints or problems. Denies chest pain or SOB. No issues overnight. Foley catheter removed this AM. We will continue therapy today, ambulated 110' yesterday.    Objective: Vital signs in last 24 hours: Temp:  [97.5 F (36.4 C)-99.6 F (37.6 C)] 98.7 F (37.1 C) (02/22 0527) Pulse Rate:  [46-80] 80 (02/22 0527) Resp:  [11-19] 16 (02/22 0527) BP: (109-176)/(67-91) 148/71 (02/22 0527) SpO2:  [94 %-100 %] 98 % (02/22 0527) Weight:  [109.8 kg] 109.8 kg (02/21 0802)  Intake/Output from previous day:  Intake/Output Summary (Last 24 hours) at 08/20/2022 0728 Last data filed at 08/20/2022 [id number] Gross per 24 hour  Intake 2323.38 ml  Output 2980 ml  Net -656.62 ml     Intake/Output this shift: No intake/output data recorded.  Labs: Recent Labs    08/20/22 0345  HGB 13.1   Recent Labs    08/20/22 0345  WBC 11.1*  RBC 4.21*  HCT 36.8*  PLT 148*   Recent Labs    08/20/22 0345  NA 134*  K 3.3*  CL 101  CO2 23  BUN 13  CREATININE 1.07  GLUCOSE 163*  CALCIUM 8.6*   No results for input(s): "LABPT", "INR" in the last 72 hours.  Exam: General - Patient is Alert and Oriented Extremity - Neurovascular intact Dorsiflexion/Plantar flexion intact Calf soft and nontender Dressing - dressing C/D/I Motor Function - intact, moving foot and toes well on exam.   Past Medical History:  Diagnosis Date   Arthritis    osteoarthritis knees hips   GERD (gastroesophageal reflux disease)    Hyperlipidemia    Hypertension    Stroke (HCC)     Assessment/Plan: 1 Day Post-Op Procedure(s) (LRB): TOTAL HIP ARTHROPLASTY ANTERIOR APPROACH (Right) Principal Problem:   OA (osteoarthritis) of hip Active Problems:   Osteoarthritis of right hip  Estimated body mass index is  34.72 kg/m as calculated from the following:   Height as of this encounter: 5' 10"$  (1.778 m).   Weight as of this encounter: 109.8 kg.   Foley out this morning DVT Prophylaxis - Aspirin 90m BID Weight bearing as tolerated. Continue therapy. Mild hypokalemia - K 3.3. 429m ordered x 1 dose.  Plan is to go Home after hospital stay. Plan for discharge with HEP later today if progresses with therapy and meeting goals. Follow-up in the office in 2 weeks.  The PDMP database was reviewed today prior to any opioid medications being prescribed to this patient.   StShearon BaloPA-C Orthopedic Surgery 08/20/2022, 7:28 AM

## 2022-08-20 NOTE — Progress Notes (Signed)
Physical Therapy Treatment Patient Details Name: Jeremiah Kelley MRN: [id number] DOB: 08-27-1945 Today's Date: 08/20/2022   History of Present Illness 77 yo male presents to therapy s/p R THA, anterior approach on 08/19/2022. Pt pmh includes but is not limited to: HDL, HTN, CVA, B TKA and L THA.    PT Comments    Pt is pending d/c home today. Pt completed there ex with min assist. Pt reports stiffness and increased hip pain. Pt min assist with bed mobility and transfers. Pt ambulated 75 ft min guard assist. Pt demonstrated dec safety with RW due to pain. Pt reported feeling "hot" and I returned him to the recliner. At this present time pt is not cleared for d/c home. I will return for a BID treatment and reassess his d/c status. Pt will continue to benefit from acute skilled PT to maximize mobility and independence.  Recommendations for follow up therapy are one component of a multi-disciplinary discharge planning process, led by the attending physician.  Recommendations may be updated based on patient status, additional functional criteria and insurance authorization.  Follow Up Recommendations  Follow physician's recommendations for discharge plan and follow up therapies     Assistance Recommended at Discharge Intermittent Supervision/Assistance  Patient can return home with the following A little help with walking and/or transfers;A little help with bathing/dressing/bathroom;Assistance with cooking/housework;Assist for transportation;Help with stairs or ramp for entrance   Equipment Recommendations  None recommended by PT    Recommendations for Other Services       Precautions / Restrictions Precautions Precautions: Anterior Hip;Fall Restrictions Weight Bearing Restrictions: No RLE Weight Bearing: Weight bearing as tolerated     Mobility  Bed Mobility Overal bed mobility: Needs Assistance Bed Mobility: Supine to Sit     Supine to sit: Min assist, HOB elevated     General  bed mobility comments: increased time, cues and HOB elevated, truck support for initial rise up    Transfers Overall transfer level: Needs assistance Equipment used: Rolling walker (2 wheels) Transfers: Sit to/from Stand Sit to Stand: Min assist, From elevated surface           General transfer comment: cues for increased forward truck flexion    Ambulation/Gait Ambulation/Gait assistance: Counsellor (Feet): 75 Feet Assistive device: Rolling walker (2 wheels) Gait Pattern/deviations: Step-to pattern, Decreased stride length, Trunk flexed, Decreased weight shift to right, Antalgic Gait velocity: decreased         Stairs             Wheelchair Mobility    Modified Rankin (Stroke Patients Only)       Balance Overall balance assessment: Needs assistance Sitting-balance support: No upper extremity supported Sitting balance-Leahy Scale: Good     Standing balance support: Bilateral upper extremity supported Standing balance-Leahy Scale: Fair                              Cognition Arousal/Alertness: Awake/alert Behavior During Therapy: WFL for tasks assessed/performed Overall Cognitive Status: Within Functional Limits for tasks assessed                                          Exercises Total Joint Exercises Ankle Circles/Pumps: AROM, Both, 20 reps Quad Sets: AROM, Strengthening, Both, 10 reps, Seated Heel Slides: AAROM, Strengthening, Right, 10 reps, Seated Hip ABduction/ADduction: AAROM, Strengthening,  Right, 10 reps, Seated Long Arc Quad: AROM, Strengthening, Right, 10 reps, Seated    General Comments General comments (skin integrity, edema, etc.): reports pain a 4-5/10, but with gait he reported feeling "hot" wanted to lean on the RW and needed cues for his safety. Pt returned to the recliner and given his breakfast due to late ordering.      Pertinent Vitals/Pain Pain Assessment Pain Score: 5  Pain  Location: R hip Pain Descriptors / Indicators: Discomfort Pain Intervention(s): Limited activity within patient's tolerance, Premedicated before session, Monitored during session, Ice applied    Home Living                          Prior Function            PT Goals (current goals can now be found in the care plan section) Progress towards PT goals: Progressing toward goals    Frequency    7X/week      PT Plan Current plan remains appropriate    Co-evaluation              AM-PAC PT "6 Clicks" Mobility   Outcome Measure  Help needed turning from your back to your side while in a flat bed without using bedrails?: A Little Help needed moving from lying on your back to sitting on the side of a flat bed without using bedrails?: A Little Help needed moving to and from a bed to a chair (including a wheelchair)?: A Little Help needed standing up from a chair using your arms (e.g., wheelchair or bedside chair)?: A Little Help needed to walk in hospital room?: A Little Help needed climbing 3-5 steps with a railing? : Total 6 Click Score: 16    End of Session Equipment Utilized During Treatment: Gait belt Activity Tolerance: Patient limited by pain Patient left: in chair;with call bell/phone within reach Nurse Communication: Mobility status PT Visit Diagnosis: Unsteadiness on feet (R26.81);Other abnormalities of gait and mobility (R26.89);Muscle weakness (generalized) (M62.81);Pain Pain - Right/Left: Right Pain - part of body: Hip     Time: [id number] PT Time Calculation (min) (ACUTE ONLY): 25 min  Charges:  $Gait Training: 8-22 mins $Therapeutic Exercise: 8-22 mins                       Lelon Mast 08/20/2022, 11:23 AM

## 2022-08-21 DIAGNOSIS — M1611 Unilateral primary osteoarthritis, right hip: Secondary | ICD-10-CM | POA: Diagnosis not present

## 2022-08-21 LAB — CBC
HCT: 36.7 % — ABNORMAL LOW (ref 39.0–52.0)
Hemoglobin: 12.8 g/dL — ABNORMAL LOW (ref 13.0–17.0)
MCH: 31.1 pg (ref 26.0–34.0)
MCHC: 34.9 g/dL (ref 30.0–36.0)
MCV: 89.1 fL (ref 80.0–100.0)
Platelets: 149 10*3/uL — ABNORMAL LOW (ref 150–400)
RBC: 4.12 MIL/uL — ABNORMAL LOW (ref 4.22–5.81)
RDW: 14 % (ref 11.5–15.5)
WBC: 11.5 10*3/uL — ABNORMAL HIGH (ref 4.0–10.5)
nRBC: 0 % (ref 0.0–0.2)

## 2022-08-21 NOTE — Plan of Care (Signed)

## 2022-08-21 NOTE — Progress Notes (Signed)
Physical Therapy Treatment Patient Details Name: Jeremiah Kelley MRN: [id number] DOB: [birth year] Today's Date: 08/21/2022   History of Present Illness 77 yo male presents to therapy s/p R THA, anterior approach on 08/19/2022. Pt pmh includes but is not limited to: HDL, HTN, CVA, B TKA and L THA.    PT Comments    Pt progressing this session, meeting PT goals and feels ready to d/c home today with son's assist. Pt pain has improved and he is able to amb long household distance. He plans to use ramp to enter home   Recommendations for follow up therapy are one component of a multi-disciplinary discharge planning process, led by the attending physician.  Recommendations may be updated based on patient status, additional functional criteria and insurance authorization.  Follow Up Recommendations  Follow physician's recommendations for discharge plan and follow up therapies     Assistance Recommended at Discharge Intermittent Supervision/Assistance  Patient can return home with the following A little help with walking and/or transfers;A little help with bathing/dressing/bathroom;Assistance with cooking/housework;Assist for transportation;Help with stairs or ramp for entrance   Equipment Recommendations  None recommended by PT    Recommendations for Other Services       Precautions / Restrictions Precautions Precautions: Fall Restrictions Weight Bearing Restrictions: No RLE Weight Bearing: Weight bearing as tolerated     Mobility  Bed Mobility Overal bed mobility: Needs Assistance Bed Mobility: Supine to Sit     Supine to sit: Supervision, Min guard     General bed mobility comments: pt was able to self assist using gait belt to lift RLE, HOB at 30*, incr time and effort however no physical assist; verbal cues for technique    Transfers Overall transfer level: Needs assistance Equipment used: Rolling walker (2 wheels) Transfers: Sit to/from Stand Sit to Stand: Min guard            General transfer comment: cues for increased forward truck flexion, increased effort and time (pt reports he has lift chair at home )    Ambulation/Gait Ambulation/Gait assistance: Supervision Gait Distance (Feet): 90 Feet Assistive device: Rolling walker (2 wheels) Gait Pattern/deviations: Step-to pattern, Decreased weight shift to right, Decreased step length - right, Decreased step length - left       General Gait Details: verbal cues for trunk extension, step length, RW position. no LOB   Stairs             Wheelchair Mobility    Modified Rankin (Stroke Patients Only)       Balance           Standing balance support: Reliant on assistive device for balance, During functional activity Standing balance-Leahy Scale: Fair                              Cognition Arousal/Alertness: Awake/alert Behavior During Therapy: WFL for tasks assessed/performed Overall Cognitive Status: Within Functional Limits for tasks assessed                                          Exercises Total Joint Exercises Ankle Circles/Pumps: AROM, Left, 10 reps Quad Sets: AROM, Both, 5 reps Heel Slides:  (reviewed use of gait belt to assist self after pain decr) Long Arc Quad:  (pt reports he has list of ex's from MD office)    General Comments  Pertinent Vitals/Pain Pain Assessment Pain Assessment: 0-10 Pain Score: 6  Pain Location: R hip Pain Descriptors / Indicators: Burning, Discomfort, Grimacing, Moaning Pain Intervention(s): Limited activity within patient's tolerance, Monitored during session, Premedicated before session, Repositioned, Ice applied    Home Living                          Prior Function            PT Goals (current goals can now be found in the care plan section) Acute Rehab PT Goals Patient Stated Goal: to move better PT Goal Formulation: With patient Time For Goal Achievement: 09/02/22 Potential  to Achieve Goals: Good Progress towards PT goals: Progressing toward goals    Frequency    7X/week      PT Plan Current plan remains appropriate    Co-evaluation              AM-PAC PT "6 Clicks" Mobility   Outcome Measure  Help needed turning from your back to your side while in a flat bed without using bedrails?: A Little Help needed moving from lying on your back to sitting on the side of a flat bed without using bedrails?: A Little Help needed moving to and from a bed to a chair (including a wheelchair)?: A Little Help needed standing up from a chair using your arms (e.g., wheelchair or bedside chair)?: A Little Help needed to walk in hospital room?: A Little Help needed climbing 3-5 steps with a railing? : A Little 6 Click Score: 18    End of Session Equipment Utilized During Treatment: Gait belt Activity Tolerance: Patient tolerated treatment well Patient left: with call bell/phone within reach;in chair;with chair alarm set Nurse Communication: Mobility status PT Visit Diagnosis: Unsteadiness on feet (R26.81);Other abnormalities of gait and mobility (R26.89);Muscle weakness (generalized) (M62.81);Pain Pain - Right/Left: Right Pain - part of body: Hip     Time: 1000-1028 PT Time Calculation (min) (ACUTE ONLY): 28 min  Charges:  $Gait Training: 23-37 mins                     Dick Hark, PT  Acute Rehab Dept [facility]) [phone]  [facility] Weekend Pager [facility] only)  [phone]  08/21/2022    [facility] 08/21/2022, 10:42 AM

## 2022-08-21 NOTE — Plan of Care (Signed)
  Problem: Activity: Goal: Ability to tolerate increased activity will improve Outcome: Progressing   Problem: Pain Management: Goal: Pain level will decrease with appropriate interventions Outcome: Progressing   Problem: Nutrition: Goal: Adequate nutrition will be maintained Outcome: Progressing

## 2022-08-21 NOTE — Progress Notes (Signed)
Pt alert and oriented. Surgical dressing clean, dry and intact. No questions or queries regarding discharge instructions. Pt waiting for ride.

## 2022-08-21 NOTE — Progress Notes (Signed)
   Subjective: 2 Days Post-Op Procedure(s) (LRB): TOTAL HIP ARTHROPLASTY ANTERIOR APPROACH (Right) Patient reports pain as mild.   Patient seen in rounds by Dr. Wynelle Link. Patient is well, and has had no acute complaints or problems. Denies chest pain or SOB. No issues overnight. Foley catheter removed this AM. We will continue therapy today, ambulated 40' in AM, 3' in PM yesterday. Patient stayed an additional night in the hospital due to difficulty with therapy yesterday afternoon, and with limited support at home as wife has MS.  Objective: Vital signs in last 24 hours: Temp:  [97.7 F (36.5 C)-98.6 F (37 C)] 97.7 F (36.5 C) (02/23 0614) Pulse Rate:  [64-72] 64 (02/23 0614) Resp:  [16-18] 16 (02/23 0614) BP: (117-155)/(61-75) 117/61 (02/23 0614) SpO2:  [96 %-99 %] 96 % (02/23 0614)  Intake/Output from previous day:  Intake/Output Summary (Last 24 hours) at 08/21/2022 0803 Last data filed at 08/20/2022 2143 Gross per 24 hour  Intake 1115.51 ml  Output 1000 ml  Net 115.51 ml     Intake/Output this shift: No intake/output data recorded.  Labs: Recent Labs    08/20/22 0345 08/21/22 0332  HGB 13.1 12.8*   Recent Labs    08/20/22 0345 08/21/22 0332  WBC 11.1* 11.5*  RBC 4.21* 4.12*  HCT 36.8* 36.7*  PLT 148* 149*   Recent Labs    08/20/22 0345  NA 134*  K 3.3*  CL 101  CO2 23  BUN 13  CREATININE 1.07  GLUCOSE 163*  CALCIUM 8.6*   No results for input(s): "LABPT", "INR" in the last 72 hours.  Exam: General - Patient is Alert and Oriented Extremity - Neurovascular intact Dorsiflexion/Plantar flexion intact Calf soft and nontender. Dressing - dressing C/D/I Motor Function - intact, moving foot and toes well on exam.   Past Medical History:  Diagnosis Date   Arthritis    osteoarthritis knees hips   GERD (gastroesophageal reflux disease)    Hyperlipidemia    Hypertension    Stroke (HCC)     Assessment/Plan: 2 Days Post-Op Procedure(s)  (LRB): TOTAL HIP ARTHROPLASTY ANTERIOR APPROACH (Right) Principal Problem:   OA (osteoarthritis) of hip Active Problems:   Osteoarthritis of right hip  Estimated body mass index is 34.72 kg/m as calculated from the following:   Height as of this encounter: 5' 10"$  (1.778 m).   Weight as of this encounter: 109.8 kg. Up with therapy  DVT Prophylaxis - Aspirin Weight bearing as tolerated. Continue therapy.  Plan is to go Home after hospital stay. Plan for discharge with HEP later today if progresses with therapy and meeting goals. Follow-up in the office in 2 weeks.  The PDMP database was reviewed today prior to any opioid medications being prescribed to this patient.  Shearon Balo, PA-C Orthopedic Surgery 08/21/2022, 8:03 AM

## 2022-08-24 NOTE — Discharge Summary (Signed)
Physician Discharge Summary   Patient ID: Jeremiah Kelley MRN: [id number] DOB/AGE: 76-Oct-1947 76 y.o.  Admit date: 08/19/2022 Discharge date: 08/21/2022  Primary Diagnosis: Right hip osteoarthritis   Admission Diagnoses:  Past Medical History:  Diagnosis Date   Arthritis    osteoarthritis knees hips   GERD (gastroesophageal reflux disease)    Hyperlipidemia    Hypertension    Stroke [facility])    Discharge Diagnoses:   Principal Problem:   OA (osteoarthritis) of hip Active Problems:   Osteoarthritis of right hip  Estimated body mass index is 34.72 kg/m as calculated from the following:   Height as of this encounter: '5\' 10"'$  (1.778 m).   Weight as of this encounter: 109.8 kg.  Procedure:  Procedure(s) (LRB): TOTAL HIP ARTHROPLASTY ANTERIOR APPROACH (Right)   Consults: None  HPI: Jeremiah Kelley is a 77 y.o. male who has advanced end-  stage arthritis of their Right  hip with progressively worsening pain and  dysfunction.The patient has failed nonoperative management and presents for  total hip arthroplasty.   Laboratory Data: Admission on 08/19/2022, Discharged on 08/21/2022  Component Date Value Ref Range Status   WBC 08/20/2022 11.1 (H)  4.0 - 10.5 K/uL Final   RBC 08/20/2022 4.21 (L)  4.22 - 5.81 MIL/uL Final   Hemoglobin 08/20/2022 13.1  13.0 - 17.0 g/dL Final   HCT 08/20/2022 36.8 (L)  39.0 - 52.0 % Final   MCV 08/20/2022 87.4  80.0 - 100.0 fL Final   MCH 08/20/2022 31.1  26.0 - 34.0 pg Final   MCHC 08/20/2022 35.6  30.0 - 36.0 g/dL Final   RDW 08/20/2022 14.0  11.5 - 15.5 % Final   Platelets 08/20/2022 148 (L)  150 - 400 K/uL Final   nRBC 08/20/2022 0.0  0.0 - 0.2 % Final   Performed at [facility], [place] [address], [place], [place] [address]   Sodium 08/20/2022 134 (L)  135 - 145 mmol/L Final   Potassium 08/20/2022 3.3 (L)  3.5 - 5.1 mmol/L Final   Chloride 08/20/2022 101  98 - 111 mmol/L Final   CO2 08/20/2022 23  22 - 32 mmol/L Final    Glucose, Bld 08/20/2022 163 (H)  70 - 99 mg/dL Final   Glucose reference range applies only to samples taken after fasting for at least 8 hours.   BUN 08/20/2022 13  8 - 23 mg/dL Final   Creatinine, Ser 08/20/2022 1.07  0.61 - 1.24 mg/dL Final   Calcium 08/20/2022 8.6 (L)  8.9 - 10.3 mg/dL Final   GFR, Estimated 08/20/2022 >60  >60 mL/min Final   Comment: (NOTE) Calculated using the CKD-EPI Creatinine Equation (2021)    Anion gap 08/20/2022 10  5 - 15 Final   Performed at [facility], [place] [address], [place], [place] [address]   WBC 08/21/2022 11.5 (H)  4.0 - 10.5 K/uL Final   RBC 08/21/2022 4.12 (L)  4.22 - 5.81 MIL/uL Final   Hemoglobin 08/21/2022 12.8 (L)  13.0 - 17.0 g/dL Final   HCT 08/21/2022 36.7 (L)  39.0 - 52.0 % Final   MCV 08/21/2022 89.1  80.0 - 100.0 fL Final   MCH 08/21/2022 31.1  26.0 - 34.0 pg Final   MCHC 08/21/2022 34.9  30.0 - 36.0 g/dL Final   RDW 08/21/2022 14.0  11.5 - 15.5 % Final   Platelets 08/21/2022 149 (L)  150 - 400 K/uL Final   nRBC 08/21/2022 0.0  0.0 - 0.2 % Final  Performed at [facility], [place] [address] [place], [place] [address]  Hospital Outpatient Visit on 08/07/2022  Component Date Value Ref Range Status   MRSA, PCR 08/07/2022 NEGATIVE  NEGATIVE Final   Staphylococcus aureus 08/07/2022 NEGATIVE  NEGATIVE Final   Comment: (NOTE) The Xpert SA Assay (FDA approved for NASAL specimens in patients 76 years of age and older), is one component of a comprehensive surveillance program. It is not intended to diagnose infection nor to guide or monitor treatment. Performed at [facility], [place] [address], [place], [place] [address]    Sodium 08/07/2022 138  135 - 145 mmol/L Final   Potassium 08/07/2022 3.1 (L)  3.5 - 5.1 mmol/L Final   Chloride 08/07/2022 104  98 - 111 mmol/L Final   CO2 08/07/2022 26  22 - 32 mmol/L Final   Glucose, Bld 08/07/2022 102 (H)  70 - 99 mg/dL Final   Glucose  reference range applies only to samples taken after fasting for at least 8 hours.   BUN 08/07/2022 13  8 - 23 mg/dL Final   Creatinine, Ser 08/07/2022 1.09  0.61 - 1.24 mg/dL Final   Calcium 08/07/2022 8.7 (L)  8.9 - 10.3 mg/dL Final   GFR, Estimated 08/07/2022 >60  >60 mL/min Final   Comment: (NOTE) Calculated using the CKD-EPI Creatinine Equation (2021)    Anion gap 08/07/2022 8  5 - 15 Final   Performed at [facility], [place] [address], [place], [place] [address]   WBC 08/07/2022 5.6  4.0 - 10.5 K/uL Final   RBC 08/07/2022 4.92  4.22 - 5.81 MIL/uL Final   Hemoglobin 08/07/2022 15.0  13.0 - 17.0 g/dL Final   HCT 08/07/2022 43.7  39.0 - 52.0 % Final   MCV 08/07/2022 88.8  80.0 - 100.0 fL Final   MCH 08/07/2022 30.5  26.0 - 34.0 pg Final   MCHC 08/07/2022 34.3  30.0 - 36.0 g/dL Final   RDW 08/07/2022 14.4  11.5 - 15.5 % Final   Platelets 08/07/2022 158  150 - 400 K/uL Final   nRBC 08/07/2022 0.0  0.0 - 0.2 % Final   Performed at [facility], [place] [address], [place], [place] [address]   ABO/RH(D) 08/07/2022 O POS   Final   Antibody Screen 08/07/2022 NEG   Final   Sample Expiration 08/07/2022 08/21/2022,2359   Final   Extend sample reason 08/07/2022    Final                   Value:NO TRANSFUSIONS OR PREGNANCY IN THE PAST 3 MONTHS Performed at [facility], [place] [address], [place],  [address]      X-Rays:DG Pelvis Portable  Result Date: 08/19/2022 CLINICAL DATA:  [id number] S/P hip replacement [id number] EXAM: PORTABLE PELVIS 1-2 VIEWS COMPARISON:  12/09/2016 FINDINGS: Postsurgical changes of right hip arthroplasty. Normal alignment. No evidence of immediate complication. Prominent but non-bridging heterotopic calcification noted along the left hip. Unchanged heterotopic calcification overlying the medial right thigh soft tissues. IMPRESSION: Postsurgical changes of right hip arthroplasty. Normal alignment. No evidence of immediate  hardware complication. Electronically Signed   By: Maurine Simmering M.D.   On: 08/19/2022 12:44   DG HIP UNILAT WITH PELVIS 1V RIGHT  Result Date: 08/19/2022 CLINICAL DATA:  [id number] Surgery, elective [id number] EXAM: DG HIP (WITH OR WITHOUT PELVIS) 1V RIGHT COMPARISON:  Radiograph 12/09/2016 FINDINGS: Intraoperative images during right hip arthroplasty. Normal alignment. No evidence of immediate complication.  Expected soft tissue changes. IMPRESSION: Intraoperative images during right hip arthroplasty. Normal alignment. Electronically Signed   By: Maurine Simmering M.D.   On: 08/19/2022 11:30   DG C-Arm 1-60 Min-No Report  Result Date: 08/19/2022 Fluoroscopy was utilized by the requesting physician.  No radiographic interpretation.   DG C-Arm 1-60 Min-No Report  Result Date: 08/19/2022 Fluoroscopy was utilized by the requesting physician.  No radiographic interpretation.    EKG: Orders placed or performed during the hospital encounter of 08/07/22   EKG 12 lead per protocol   EKG 12 lead per protocol     Hospital Course: Jeremiah Kelley is a 77 y.o. who was admitted to [facility]. They were brought to the operating room on 08/19/2022 and underwent Procedure(s): [place].  Patient tolerated the procedure well and was later transferred to the recovery room and then to the orthopaedic floor for postoperative care. They were given PO and IV analgesics for pain control following their surgery. They were given 24 hours of postoperative antibiotics of  Anti-infectives (From admission, onward)    Start     Dose/Rate Route Frequency Ordered Stop   08/19/22 1600  ceFAZolin (ANCEF) IVPB 2g/100 mL premix        2 g 200 mL/hr over 30 Minutes Intravenous Every 6 hours 08/19/22 1408 08/19/22 2207   08/19/22 0745  ceFAZolin (ANCEF) IVPB 2g/100 mL premix        2 g 200 mL/hr over 30 Minutes Intravenous On call to O.R. 08/19/22 [id number] 08/19/22 1012      and started on DVT  prophylaxis in the form of Aspirin.   PT and OT were ordered for total joint protocol. Discharge planning consulted to help with postop disposition and equipment needs. Patient had an uneventful night on the evening of surgery. They started to get up OOB with therapy on POD 0. Continued to work with therapy into POD #2. Pt was seen during rounds on day two and was ready to go home pending progress with therapy. Aquacel was intact. Pt worked with therapy for an additional session he and was meeting their goals. He was discharged to home with HEP later that day in stable condition.  Diet: Regular diet Activity: WBAT Follow-up: in 2 weeks Disposition: Home Discharged Condition: good   Discharge Instructions     Call MD / Call 911   Complete by: As directed    If you experience chest pain or shortness of breath, CALL 911 and be transported to the hospital emergency room.  If you develope a fever above 101 F, pus (white drainage) or increased drainage or redness at the wound, or calf pain, call your surgeon's office.   Change dressing   Complete by: As directed    You have an adhesive waterproof bandage over the incision. Leave this in place until your first follow-up appointment. Once you remove this you will not need to place another bandage.   Constipation Prevention   Complete by: As directed    Drink plenty of fluids.  Prune juice may be helpful.  You may use a stool softener, such as Colace (over the counter) 100 mg twice a day.  Use MiraLax (over the counter) for constipation as needed.   Diet - low sodium heart healthy   Complete by: As directed    Do not sit on low chairs, stoools or toilet seats, as it may be difficult to get up from low surfaces   Complete by: As  directed    Driving restrictions   Complete by: As directed    No driving for two weeks   Post-operative opioid taper instructions:   Complete by: As directed    POST-OPERATIVE OPIOID TAPER INSTRUCTIONS: It is important  to wean off of your opioid medication as soon as possible. If you do not need pain medication after your surgery it is ok to stop day one. Opioids include: Codeine, Hydrocodone(Norco, Vicodin), Oxycodone(Percocet, oxycontin) and hydromorphone amongst others.  Long term and even short term use of opiods can cause: Increased pain response Dependence Constipation Depression Respiratory depression And more.  Withdrawal symptoms can include Flu like symptoms Nausea, vomiting And more Techniques to manage these symptoms Hydrate well Eat regular healthy meals Stay active Use relaxation techniques(deep breathing, meditating, yoga) Do Not substitute Alcohol to help with tapering If you have been on opioids for less than two weeks and do not have pain than it is ok to stop all together.  Plan to wean off of opioids This plan should start within one week post op of your joint replacement. Maintain the same interval or time between taking each dose and first decrease the dose.  Cut the total daily intake of opioids by one tablet each day Next start to increase the time between doses. The last dose that should be eliminated is the evening dose.      TED hose   Complete by: As directed    Use stockings (TED hose) for three weeks on both leg(s).  You may remove them at night for sleeping.   Weight bearing as tolerated   Complete by: As directed       Allergies as of 08/21/2022   No Known Allergies      Medication List     TAKE these medications    acetaminophen 650 MG CR tablet Commonly known as: TYLENOL Take 1,300 mg by mouth every 8 (eight) hours as needed for pain.   amLODipine 5 MG tablet Commonly known as: NORVASC Take 5 mg by mouth daily.   aspirin 81 MG chewable tablet Chew 1 tablet (81 mg total) by mouth 2 (two) times daily. What changed: when to take this   bisacodyl 5 MG EC tablet Commonly known as: DULCOLAX Take 5 mg by mouth daily as needed for moderate  constipation.   carboxymethylcellul-glycerin 0.5-0.9 % ophthalmic solution Commonly known as: REFRESH OPTIVE Place 1 drop into both eyes in the morning and at bedtime.   celecoxib 100 MG capsule Commonly known as: CELEBREX Take 100 mg by mouth daily.   hydrochlorothiazide 25 MG tablet Commonly known as: HYDRODIURIL Take 25 mg by mouth daily.   HYDROcodone-acetaminophen 5-325 MG tablet Commonly known as: NORCO/VICODIN Take 1-2 tablets by mouth every 6 (six) hours as needed for severe pain.   methocarbamol 500 MG tablet Commonly known as: ROBAXIN Take 1 tablet (500 mg total) by mouth every 6 (six) hours as needed for muscle spasms.   multivitamin with minerals Tabs tablet Take 1 tablet by mouth daily.   pantoprazole 40 MG tablet Commonly known as: PROTONIX Take 40 mg by mouth daily.   polyethylene glycol 17 g packet Commonly known as: MIRALAX / GLYCOLAX Take 17 g by mouth daily as needed for moderate constipation.   simvastatin 20 MG tablet Commonly known as: ZOCOR Take 20 mg by mouth daily.   traMADol 50 MG tablet Commonly known as: ULTRAM Take 1-2 tablets (50-100 mg total) by mouth every 6 (six) hours as needed for  moderate pain.               Discharge Care Instructions  (From admission, onward)           Start     Ordered   08/20/22 0000  Weight bearing as tolerated        08/20/22 0733   08/20/22 0000  Change dressing       Comments: You have an adhesive waterproof bandage over the incision. Leave this in place until your first follow-up appointment. Once you remove this you will not need to place another bandage.   08/20/22 [id number]            Follow-up Information     Gaynelle Arabian, MD. Schedule an appointment as soon as possible for a visit in 2 week(s).   Specialty: Orthopedic Surgery Contact information: [address] [place] [address] [id number]                 Signed: Shearon Balo,  PA-C Orthopedic Surgery 08/24/2022, 12:49 PM

## 2022-08-26 DIAGNOSIS — Z96641 Presence of right artificial hip joint: Secondary | ICD-10-CM | POA: Diagnosis not present

## 2022-08-26 DIAGNOSIS — M542 Cervicalgia: Secondary | ICD-10-CM | POA: Diagnosis not present

## 2022-09-03 ENCOUNTER — Other Ambulatory Visit (HOSPITAL_COMMUNITY): Payer: No Typology Code available for payment source

## 2022-12-17 DIAGNOSIS — I10 Essential (primary) hypertension: Secondary | ICD-10-CM | POA: Diagnosis not present

## 2022-12-17 DIAGNOSIS — Z125 Encounter for screening for malignant neoplasm of prostate: Secondary | ICD-10-CM | POA: Diagnosis not present

## 2022-12-17 DIAGNOSIS — R7303 Prediabetes: Secondary | ICD-10-CM | POA: Diagnosis not present

## 2022-12-23 DIAGNOSIS — M25512 Pain in left shoulder: Secondary | ICD-10-CM | POA: Diagnosis not present

## 2022-12-23 DIAGNOSIS — E669 Obesity, unspecified: Secondary | ICD-10-CM | POA: Diagnosis not present

## 2022-12-23 DIAGNOSIS — M1990 Unspecified osteoarthritis, unspecified site: Secondary | ICD-10-CM | POA: Diagnosis not present

## 2022-12-23 DIAGNOSIS — Z0001 Encounter for general adult medical examination with abnormal findings: Secondary | ICD-10-CM | POA: Diagnosis not present

## 2022-12-23 DIAGNOSIS — Z7189 Other specified counseling: Secondary | ICD-10-CM | POA: Diagnosis not present

## 2022-12-23 DIAGNOSIS — E782 Mixed hyperlipidemia: Secondary | ICD-10-CM | POA: Diagnosis not present

## 2022-12-23 DIAGNOSIS — K219 Gastro-esophageal reflux disease without esophagitis: Secondary | ICD-10-CM | POA: Diagnosis not present

## 2022-12-23 DIAGNOSIS — R7303 Prediabetes: Secondary | ICD-10-CM | POA: Diagnosis not present

## 2022-12-23 DIAGNOSIS — M25511 Pain in right shoulder: Secondary | ICD-10-CM | POA: Diagnosis not present

## 2023-06-17 DIAGNOSIS — R7303 Prediabetes: Secondary | ICD-10-CM | POA: Diagnosis not present

## 2023-06-17 DIAGNOSIS — I10 Essential (primary) hypertension: Secondary | ICD-10-CM | POA: Diagnosis not present

## 2023-06-24 DIAGNOSIS — Z6830 Body mass index (BMI) 30.0-30.9, adult: Secondary | ICD-10-CM | POA: Diagnosis not present

## 2023-06-24 DIAGNOSIS — Z23 Encounter for immunization: Secondary | ICD-10-CM | POA: Diagnosis not present

## 2023-06-24 DIAGNOSIS — R42 Dizziness and giddiness: Secondary | ICD-10-CM | POA: Diagnosis not present

## 2023-06-24 DIAGNOSIS — I10 Essential (primary) hypertension: Secondary | ICD-10-CM | POA: Diagnosis not present

## 2023-06-24 DIAGNOSIS — K219 Gastro-esophageal reflux disease without esophagitis: Secondary | ICD-10-CM | POA: Diagnosis not present

## 2023-06-24 DIAGNOSIS — E669 Obesity, unspecified: Secondary | ICD-10-CM | POA: Diagnosis not present

## 2023-06-24 DIAGNOSIS — M1990 Unspecified osteoarthritis, unspecified site: Secondary | ICD-10-CM | POA: Diagnosis not present

## 2023-06-24 DIAGNOSIS — R7303 Prediabetes: Secondary | ICD-10-CM | POA: Diagnosis not present

## 2023-06-24 DIAGNOSIS — E782 Mixed hyperlipidemia: Secondary | ICD-10-CM | POA: Diagnosis not present

## 2023-06-28 ENCOUNTER — Other Ambulatory Visit (HOSPITAL_COMMUNITY): Payer: Self-pay | Admitting: Nurse Practitioner

## 2023-06-28 DIAGNOSIS — R42 Dizziness and giddiness: Secondary | ICD-10-CM

## 2023-07-06 ENCOUNTER — Ambulatory Visit (HOSPITAL_COMMUNITY)
Admission: RE | Admit: 2023-07-06 | Discharge: 2023-07-06 | Discharge status: Home / Self Care | Payer: Medicare PPO | Source: Ambulatory Visit | Attending: Nurse Practitioner | Admitting: Nurse Practitioner

## 2023-07-06 ENCOUNTER — Ambulatory Visit (HOSPITAL_COMMUNITY)
Admission: RE | Admit: 2023-07-06 | Discharge: 2023-07-06 | Disposition: A | Discharge status: Home / Self Care | Payer: Medicare PPO | Source: Ambulatory Visit | Attending: Nurse Practitioner | Admitting: Nurse Practitioner

## 2023-07-06 DIAGNOSIS — R42 Dizziness and giddiness: Secondary | ICD-10-CM | POA: Diagnosis not present

## 2023-07-06 DIAGNOSIS — R4182 Altered mental status, unspecified: Secondary | ICD-10-CM | POA: Diagnosis not present

## 2023-07-06 DIAGNOSIS — G9389 Other specified disorders of brain: Secondary | ICD-10-CM | POA: Diagnosis not present

## 2023-07-06 DIAGNOSIS — I6523 Occlusion and stenosis of bilateral carotid arteries: Secondary | ICD-10-CM | POA: Diagnosis not present

## 2023-09-13 ENCOUNTER — Ambulatory Visit: Payer: No Typology Code available for payment source | Admitting: Neurology

## 2023-09-28 DIAGNOSIS — I10 Essential (primary) hypertension: Secondary | ICD-10-CM | POA: Diagnosis not present

## 2023-09-28 DIAGNOSIS — R7303 Prediabetes: Secondary | ICD-10-CM | POA: Diagnosis not present

## 2023-10-04 DIAGNOSIS — M13842 Other specified arthritis, left hand: Secondary | ICD-10-CM | POA: Diagnosis not present

## 2023-10-04 DIAGNOSIS — R42 Dizziness and giddiness: Secondary | ICD-10-CM | POA: Diagnosis not present

## 2023-10-04 DIAGNOSIS — R7303 Prediabetes: Secondary | ICD-10-CM | POA: Diagnosis not present

## 2023-10-04 DIAGNOSIS — M13841 Other specified arthritis, right hand: Secondary | ICD-10-CM | POA: Diagnosis not present

## 2023-10-04 DIAGNOSIS — K219 Gastro-esophageal reflux disease without esophagitis: Secondary | ICD-10-CM | POA: Diagnosis not present

## 2023-10-04 DIAGNOSIS — E782 Mixed hyperlipidemia: Secondary | ICD-10-CM | POA: Diagnosis not present

## 2023-10-04 DIAGNOSIS — E669 Obesity, unspecified: Secondary | ICD-10-CM | POA: Diagnosis not present

## 2023-10-04 DIAGNOSIS — Z6830 Body mass index (BMI) 30.0-30.9, adult: Secondary | ICD-10-CM | POA: Diagnosis not present

## 2023-10-04 DIAGNOSIS — I10 Essential (primary) hypertension: Secondary | ICD-10-CM | POA: Diagnosis not present

## 2024-03-29 DIAGNOSIS — R7301 Impaired fasting glucose: Secondary | ICD-10-CM | POA: Diagnosis not present

## 2024-03-29 DIAGNOSIS — I10 Essential (primary) hypertension: Secondary | ICD-10-CM | POA: Diagnosis not present

## 2024-04-04 DIAGNOSIS — K219 Gastro-esophageal reflux disease without esophagitis: Secondary | ICD-10-CM | POA: Diagnosis not present

## 2024-04-04 DIAGNOSIS — M1990 Unspecified osteoarthritis, unspecified site: Secondary | ICD-10-CM | POA: Diagnosis not present

## 2024-04-04 DIAGNOSIS — R42 Dizziness and giddiness: Secondary | ICD-10-CM | POA: Diagnosis not present

## 2024-04-04 DIAGNOSIS — R7303 Prediabetes: Secondary | ICD-10-CM | POA: Diagnosis not present

## 2024-04-04 DIAGNOSIS — I10 Essential (primary) hypertension: Secondary | ICD-10-CM | POA: Diagnosis not present

## 2024-04-04 DIAGNOSIS — L02415 Cutaneous abscess of right lower limb: Secondary | ICD-10-CM | POA: Diagnosis not present

## 2024-04-04 DIAGNOSIS — Z0000 Encounter for general adult medical examination without abnormal findings: Secondary | ICD-10-CM | POA: Diagnosis not present

## 2024-04-04 DIAGNOSIS — Z0001 Encounter for general adult medical examination with abnormal findings: Secondary | ICD-10-CM | POA: Diagnosis not present

## 2024-04-04 DIAGNOSIS — E782 Mixed hyperlipidemia: Secondary | ICD-10-CM | POA: Diagnosis not present

## 2024-04-26 ENCOUNTER — Encounter: Attending: Nurse Practitioner | Admitting: Nutrition

## 2024-04-26 VITALS — Ht 69.0 in | Wt 252.0 lb

## 2024-04-26 DIAGNOSIS — R7303 Prediabetes: Secondary | ICD-10-CM | POA: Insufficient documentation

## 2024-04-26 DIAGNOSIS — E669 Obesity, unspecified: Secondary | ICD-10-CM | POA: Insufficient documentation

## 2024-04-26 NOTE — Patient Instructions (Addendum)
 Goals Eats grits or oatmeal for breakfast  Go to the [employer] 3 times per week Focus on eating more fruits, vegetables and whole grains Eat three meals per day B 6-8 L)13-2 D) 5-7 pm Lose 1 lb per week

## 2024-04-26 NOTE — Progress Notes (Signed)
 Medical Nutrition Therapy  Appointment Start time:  1400  Appointment End time:  1500  Primary concerns today: Obesity and Pre diabetes Referral diagnosis: E66.9, R 73.03 Preferred learning style: No Preference  Learning readiness: Ready    NUTRITION ASSESSMENT  78 yr old bmale referred for obesity and pre diabetes. Lost his wife his wife 5-6 months ago. 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 Trying to be more active.  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 PCP Rosaline Macadam, FNP  He is willing to work on a more whole plant based diet lifestyle and be more active to provided needed wieght loss and reverse his pre diabetes.   Clinical Medical Hx:  Past Medical History:  Diagnosis Date   Arthritis    osteoarthritis knees hips   GERD (gastroesophageal reflux disease)    Hyperlipidemia    Hypertension    Stroke [facility])     Medications:  Current Outpatient Medications on File Prior to Visit  Medication Sig Dispense Refill   acetaminophen  (TYLENOL ) 650 MG CR tablet Take 1,300 mg by mouth every 8 (eight) hours as needed for pain.     amLODipine  (NORVASC ) 5 MG tablet Take 5 mg by mouth daily.      aspirin  81 MG chewable tablet Chew 1 tablet (81 mg total) by mouth 2 (two) times daily. 20 tablet 0   bisacodyl  (DULCOLAX) 5 MG EC tablet Take 5 mg by mouth daily as needed for moderate constipation.     carboxymethylcellul-glycerin (REFRESH OPTIVE) 0.5-0.9 % ophthalmic solution Place 1 drop into both eyes in the morning and at bedtime.     celecoxib (CELEBREX) 100 MG capsule Take 100 mg by mouth daily.     hydrochlorothiazide  (HYDRODIURIL ) 25 MG tablet Take 25 mg by mouth daily.     HYDROcodone -acetaminophen  (NORCO/VICODIN) 5-325 MG tablet Take 1-2 tablets by mouth every 6 (six) hours as needed for severe pain. 42 tablet 0   methocarbamol  (ROBAXIN ) 500 MG tablet  Take 1 tablet (500 mg total) by mouth every 6 (six) hours as needed for muscle spasms. 30 tablet 0   Multiple Vitamin (MULTIVITAMIN WITH MINERALS) TABS tablet Take 1 tablet by mouth daily.     pantoprazole  (PROTONIX ) 40 MG tablet Take 40 mg by mouth daily.     polyethylene glycol (MIRALAX  / GLYCOLAX ) 17 g packet Take 17 g by mouth daily as needed for moderate constipation.     simvastatin  (ZOCOR ) 20 MG tablet Take 20 mg by mouth daily.     traMADol  (ULTRAM ) 50 MG tablet Take 1-2 tablets (50-100 mg total) by mouth every 6 (six) hours as needed for moderate pain. 42 tablet 0   No current facility-administered medications on file prior to visit.    Labs:     Latest Ref Rng & Units 08/20/2022    3:45 AM 08/07/2022   11:52 AM 06/05/2020    3:26 AM  CMP  Glucose 70 - 99 mg/dL 836  897  861   BUN 8 - 23 mg/dL 13  13  14    Creatinine 0.61 - 1.24 mg/dL 8.92  8.90  9.09   Sodium 135 - 145 mmol/L 134  138  136   Potassium 3.5 - 5.1 mmol/L 3.3  3.1  3.4   Chloride 98 - 111 mmol/L 101  104  101   CO2 22 - 32 mmol/L  23  26  22    Calcium 8.9 - 10.3 mg/dL 8.6  8.7  8.3    Lipid Panel     Notable Signs/Symptoms: None  Lifestyle & Dietary Hx LIves by himself. Widow  Estimated daily fluid intake: 40 oz Supplements:  Sleep: 4-5 hrs.  Stress / self-care: lost his wife Current average weekly physical activity: working some  24-Hr Dietary Recall Eats 2-3 meals per day. Tends to snack at night  Estimated Energy Needs Calories: 1800 Carbohydrate: 200g Protein: 135g Fat: 50g   NUTRITION DIAGNOSIS  NI-1.7 Predicted excessive energy intake As related to eating 2-3 meals per day and some snacks.  As evidenced by BMI 27.   NUTRITION INTERVENTION  Nutrition education (E-1) on the following topics:  Nutrition and Diabetes education provided on My Plate, CHO counting, meal planning, portion sizes, timing of meals, avoiding snacks between meals unless having a low blood sugar, target ranges for  A1C and blood sugars, signs/symptoms and treatment of hyper/hypoglycemia, monitoring blood sugars, taking medications as prescribed, benefits of exercising 30 minutes per day and prevention of complications of DM.   Lifestyle Medicine  - Whole Food, Plant Predominant Nutrition is highly recommended: Eat Plenty of vegetables, Mushrooms, fruits, Legumes, Whole Grains, Nuts, seeds in lieu of processed meats, processed snacks/pastries red meat, poultry, eggs.    -It is better to avoid simple carbohydrates including: Cakes, Sweet Desserts, Ice Cream, Soda (diet and regular), Sweet Tea, Candies, Chips, Cookies, Store Bought Juices, Alcohol  in Excess of  1-2 drinks a day, Lemonade,  Artificial Sweeteners, Doughnuts, Coffee Creamers, Sugar-free Products, etc, etc.  This is not a complete list.....  Exercise: If you are able: 30 -60 minutes a day ,4 days a week, or 150 minutes a week.  The longer the better.  Combine stretch, strength, and aerobic activities.  If you were told in the past that you have high risk for cardiovascular diseases, you may seek evaluation by your heart doctor prior to initiating moderate to intense exercise programs.   Handouts Provided Include  Lifestyle Medicine Handouts Pre Diabetes  Learning Style & Readiness for Change Teaching method utilized: Visual & Auditory  Demonstrated degree of understanding via: Teach Back  Barriers to learning/adherence to lifestyle change: none  Goals Established by Pt Goals Eats grits or oatmeal for breakfast  Go to the [employer] [facility] times per week Focus on eating more fruits, vegetables and whole grains Eat three meals per day B 6-8 L)13-2 D) 5-7 pm Lose 1 lb per week   MONITORING & EVALUATION Dietary intake, weekly physical activity, and weight in 1 month.  Next Steps  Patient is to work on eating more fruits, vegetables and whole grains.SABRA

## 2024-04-27 DIAGNOSIS — Z791 Long term (current) use of non-steroidal anti-inflammatories (NSAID): Secondary | ICD-10-CM | POA: Diagnosis not present

## 2024-04-27 DIAGNOSIS — N182 Chronic kidney disease, stage 2 (mild): Secondary | ICD-10-CM | POA: Diagnosis not present

## 2024-04-27 DIAGNOSIS — Z8673 Personal history of transient ischemic attack (TIA), and cerebral infarction without residual deficits: Secondary | ICD-10-CM | POA: Diagnosis not present

## 2024-04-27 DIAGNOSIS — E785 Hyperlipidemia, unspecified: Secondary | ICD-10-CM | POA: Diagnosis not present

## 2024-04-27 DIAGNOSIS — K219 Gastro-esophageal reflux disease without esophagitis: Secondary | ICD-10-CM | POA: Diagnosis not present

## 2024-04-27 DIAGNOSIS — M1990 Unspecified osteoarthritis, unspecified site: Secondary | ICD-10-CM | POA: Diagnosis not present

## 2024-04-27 DIAGNOSIS — Z87891 Personal history of nicotine dependence: Secondary | ICD-10-CM | POA: Diagnosis not present

## 2024-04-27 DIAGNOSIS — I129 Hypertensive chronic kidney disease with stage 1 through stage 4 chronic kidney disease, or unspecified chronic kidney disease: Secondary | ICD-10-CM | POA: Diagnosis not present

## 2024-05-01 ENCOUNTER — Encounter: Payer: Self-pay | Admitting: Nutrition

## 2024-06-01 ENCOUNTER — Encounter: Admitting: Nutrition

## 2024-06-05 ENCOUNTER — Encounter: Attending: Nurse Practitioner | Admitting: Nutrition

## 2024-06-05 VITALS — Ht 70.0 in | Wt 256.0 lb

## 2024-06-05 DIAGNOSIS — E669 Obesity, unspecified: Secondary | ICD-10-CM | POA: Insufficient documentation

## 2024-06-05 DIAGNOSIS — R7303 Prediabetes: Secondary | ICD-10-CM | POA: Insufficient documentation

## 2024-06-05 NOTE — Patient Instructions (Addendum)
 Goals  Start back at [facility] in January 1st. Try [facility] Try a new recipe a week Work meal planning and meal prepping. Work out 3 times per week at [employer] or [employer]

## 2024-06-05 NOTE — Progress Notes (Signed)
 Medical Nutrition Therapy  Appointment Start time:  [id number]  Appointment End time:  0915 Primary concerns today: Obesity and Pre diabetes Referral diagnosis: E66.9, R 73.03 Preferred learning style: No Preference  Learning readiness: Ready    NUTRITION ASSESSMENT Follow up pre dm and obesity Has been exercising more now . Has been eating more like [place]  diet. Eating beets Gained 4 lbs. He notes he needs to get a little more serious about portion sizes and get back to exercising more seriously. Did tend to cheat some over the holidays. Is ready to get back on track and be more consistent  PCP Rosaline Macadam, FNP  He is willing to work on a more whole plant based diet lifestyle and be more active to provided needed wieght loss and reverse his pre diabetes.   Clinical Medical Hx:  Past Medical History:  Diagnosis Date   Arthritis    osteoarthritis knees hips   GERD (gastroesophageal reflux disease)    Hyperlipidemia    Hypertension    Stroke [facility])     Medications:  Current Outpatient Medications on File Prior to Visit  Medication Sig Dispense Refill   acetaminophen  (TYLENOL ) 650 MG CR tablet Take 1,300 mg by mouth every 8 (eight) hours as needed for pain.     amLODipine  (NORVASC ) 5 MG tablet Take 5 mg by mouth daily.      aspirin  81 MG chewable tablet Chew 1 tablet (81 mg total) by mouth 2 (two) times daily. 20 tablet 0   bisacodyl  (DULCOLAX) 5 MG EC tablet Take 5 mg by mouth daily as needed for moderate constipation.     carboxymethylcellul-glycerin (REFRESH OPTIVE) 0.5-0.9 % ophthalmic solution Place 1 drop into both eyes in the morning and at bedtime.     celecoxib (CELEBREX) 100 MG capsule Take 100 mg by mouth daily.     hydrochlorothiazide  (HYDRODIURIL ) 25 MG tablet Take 25 mg by mouth daily.     HYDROcodone -acetaminophen  (NORCO/VICODIN) 5-325 MG tablet Take 1-2 tablets by mouth every 6 (six) hours as needed for severe pain. 42 tablet 0   methocarbamol  (ROBAXIN ) 500  MG tablet Take 1 tablet (500 mg total) by mouth every 6 (six) hours as needed for muscle spasms. 30 tablet 0   Multiple Vitamin (MULTIVITAMIN WITH MINERALS) TABS tablet Take 1 tablet by mouth daily.     pantoprazole  (PROTONIX ) 40 MG tablet Take 40 mg by mouth daily.     polyethylene glycol (MIRALAX  / GLYCOLAX ) 17 g packet Take 17 g by mouth daily as needed for moderate constipation.     simvastatin  (ZOCOR ) 20 MG tablet Take 20 mg by mouth daily.     traMADol  (ULTRAM ) 50 MG tablet Take 1-2 tablets (50-100 mg total) by mouth every 6 (six) hours as needed for moderate pain. 42 tablet 0   No current facility-administered medications on file prior to visit.    Labs:     Latest Ref Rng & Units 08/20/2022    3:45 AM 08/07/2022   11:52 AM 06/05/2020    3:26 AM  CMP  Glucose 70 - 99 mg/dL [phone]   BUN 8 - 23 mg/dL 13  13  14    Creatinine 0.61 - 1.24 mg/dL 8.92  8.90  9.09   Sodium 135 - 145 mmol/L 134  138  136   Potassium 3.5 - 5.1 mmol/L 3.3  3.1  3.4   Chloride 98 - 111 mmol/L 101  104  101   CO2 22 -  32 mmol/L 23  26  22    Calcium 8.9 - 10.3 mg/dL 8.6  8.7  8.3    Lipid Panel     Notable Signs/Symptoms: None  Lifestyle & Dietary Hx LIves by himself. Widow  Estimated daily fluid intake: 40 oz Supplements:  Sleep: 4-5 hrs.  Stress / self-care: lost his wife Current average weekly physical activity: working some  24-Hr Dietary Recall B) Hot tea and stopped coffee, yogurt and banana L) boiled eggs 2, beets, onions, water  or leftovers D) Leftovers from [employer] , chicken, fried rice, mushrooms,, water  Water : 120 oz of water    Estimated Energy Needs Calories: 1800 Carbohydrate: 200g Protein: 135g Fat: 50g   NUTRITION DIAGNOSIS  NI-1.7 Predicted excessive energy intake As related to eating 2-3 meals per day and some snacks.  As evidenced by BMI 27.   NUTRITION INTERVENTION  Nutrition education (E-1) on the following topics:  Nutrition and Diabetes education provided on  My Plate, CHO counting, meal planning, portion sizes, timing of meals, avoiding snacks between meals unless having a low blood sugar, target ranges for A1C and blood sugars, signs/symptoms and treatment of hyper/hypoglycemia, monitoring blood sugars, taking medications as prescribed, benefits of exercising 30 minutes per day and prevention of complications of DM.   Lifestyle Medicine  - Whole Food, Plant Predominant Nutrition is highly recommended: Eat Plenty of vegetables, Mushrooms, fruits, Legumes, Whole Grains, Nuts, seeds in lieu of processed meats, processed snacks/pastries red meat, poultry, eggs.    -It is better to avoid simple carbohydrates including: Cakes, Sweet Desserts, Ice Cream, Soda (diet and regular), Sweet Tea, Candies, Chips, Cookies, Store Bought Juices, Alcohol  in Excess of  1-2 drinks a day, Lemonade,  Artificial Sweeteners, Doughnuts, Coffee Creamers, Sugar-free Products, etc, etc.  This is not a complete list.....  Exercise: If you are able: 30 -60 minutes a day ,4 days a week, or 150 minutes a week.  The longer the better.  Combine stretch, strength, and aerobic activities.  If you were told in the past that you have high risk for cardiovascular diseases, you may seek evaluation by your heart doctor prior to initiating moderate to intense exercise programs.   Handouts Provided Include  Lifestyle Medicine Handouts Pre Diabetes  Learning Style & Readiness for Change Teaching method utilized: Visual & Auditory  Demonstrated degree of understanding via: Teach Back  Barriers to learning/adherence to lifestyle change: none  Goals Established by Pt  Start back at [facility] in January 1st. Try [facility] Try a new recipe a week Work meal planning and meal prepping. Work out 3 times per week at [employer] or [employer] Dietary intake, weekly physical activity, and weight in 1 month.  Next Steps  Patient is to work on eating more  fruits, vegetables and whole grains.SABRA

## 2024-06-14 ENCOUNTER — Encounter: Payer: Self-pay | Admitting: Nutrition

## 2024-10-04 ENCOUNTER — Encounter: Admitting: Nutrition
